# Patient Record
Sex: Female | Born: 1985
Health system: Southern US, Community
[De-identification: ages and names within clinical notes are randomized; demographics above are authoritative.]

## PROBLEM LIST (undated history)

## (undated) ENCOUNTER — Inpatient Hospital Stay (HOSPITAL_COMMUNITY): Payer: Self-pay

## (undated) ENCOUNTER — Emergency Department (HOSPITAL_COMMUNITY): Admission: EM | Payer: Medicaid Other | Source: Home / Self Care

## (undated) DIAGNOSIS — J101 Influenza due to other identified influenza virus with other respiratory manifestations: Secondary | ICD-10-CM

## (undated) DIAGNOSIS — G8929 Other chronic pain: Secondary | ICD-10-CM

## (undated) DIAGNOSIS — D649 Anemia, unspecified: Secondary | ICD-10-CM

## (undated) DIAGNOSIS — T7840XA Allergy, unspecified, initial encounter: Secondary | ICD-10-CM

## (undated) DIAGNOSIS — Z6832 Body mass index (BMI) 32.0-32.9, adult: Secondary | ICD-10-CM

## (undated) DIAGNOSIS — M1990 Unspecified osteoarthritis, unspecified site: Secondary | ICD-10-CM

## (undated) DIAGNOSIS — R87629 Unspecified abnormal cytological findings in specimens from vagina: Secondary | ICD-10-CM

## (undated) DIAGNOSIS — IMO0002 Reserved for concepts with insufficient information to code with codable children: Secondary | ICD-10-CM

## (undated) DIAGNOSIS — R6889 Other general symptoms and signs: Secondary | ICD-10-CM

## (undated) DIAGNOSIS — E559 Vitamin D deficiency, unspecified: Secondary | ICD-10-CM

## (undated) DIAGNOSIS — N6002 Solitary cyst of left breast: Secondary | ICD-10-CM

## (undated) DIAGNOSIS — E669 Obesity, unspecified: Secondary | ICD-10-CM

## (undated) DIAGNOSIS — N644 Mastodynia: Secondary | ICD-10-CM

## (undated) DIAGNOSIS — N611 Abscess of the breast and nipple: Secondary | ICD-10-CM

## (undated) DIAGNOSIS — J029 Acute pharyngitis, unspecified: Secondary | ICD-10-CM

## (undated) HISTORY — DX: Allergy, unspecified, initial encounter: T78.40XA

## (undated) HISTORY — PX: COLOSTOMY: SHX63

## (undated) HISTORY — DX: Reserved for concepts with insufficient information to code with codable children: IMO0002

## (undated) HISTORY — DX: Unspecified osteoarthritis, unspecified site: M19.90

## (undated) HISTORY — PX: OTHER SURGICAL HISTORY: SHX169

## (undated) HISTORY — PX: COLPOSCOPY: SHX161

## (undated) HISTORY — DX: Unspecified abnormal cytological findings in specimens from vagina: R87.629

## (undated) HISTORY — DX: Obesity, unspecified: E66.9

---
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
## 2008-06-16 ENCOUNTER — Inpatient Hospital Stay (HOSPITAL_COMMUNITY): Admission: AD | Admit: 2008-06-16 | Discharge: 2008-06-16 | Payer: Self-pay | Admitting: Obstetrics and Gynecology

## 2008-07-03 ENCOUNTER — Inpatient Hospital Stay (HOSPITAL_COMMUNITY): Admission: AD | Admit: 2008-07-03 | Discharge: 2008-07-03 | Payer: Self-pay | Admitting: Obstetrics & Gynecology

## 2008-07-03 ENCOUNTER — Ambulatory Visit: Payer: Self-pay | Admitting: Obstetrics and Gynecology

## 2008-07-19 ENCOUNTER — Inpatient Hospital Stay (HOSPITAL_COMMUNITY): Admission: AD | Admit: 2008-07-19 | Discharge: 2008-07-19 | Payer: Self-pay | Admitting: Obstetrics & Gynecology

## 2008-07-26 ENCOUNTER — Ambulatory Visit: Payer: Self-pay | Admitting: Obstetrics and Gynecology

## 2008-07-26 ENCOUNTER — Encounter: Payer: Self-pay | Admitting: Family Medicine

## 2008-07-26 LAB — CONVERTED CEMR LAB
Basophils Absolute: 0 10*3/uL (ref 0.0–0.1)
Hemoglobin: 11.3 g/dL — ABNORMAL LOW (ref 12.0–15.0)
Hepatitis B Surface Ag: NEGATIVE
Lymphocytes Relative: 36 % (ref 12–46)
Monocytes Absolute: 0.6 10*3/uL (ref 0.1–1.0)
Neutro Abs: 3.8 10*3/uL (ref 1.7–7.7)
RDW: 14.7 % (ref 11.5–15.5)

## 2008-08-08 ENCOUNTER — Other Ambulatory Visit: Admission: RE | Admit: 2008-08-08 | Discharge: 2008-08-08 | Payer: Self-pay | Admitting: Obstetrics & Gynecology

## 2008-08-08 ENCOUNTER — Encounter: Payer: Self-pay | Admitting: Obstetrics & Gynecology

## 2008-08-08 ENCOUNTER — Encounter: Payer: Self-pay | Admitting: Family Medicine

## 2008-08-08 ENCOUNTER — Ambulatory Visit: Payer: Self-pay | Admitting: Obstetrics & Gynecology

## 2008-08-08 LAB — CONVERTED CEMR LAB
Hgb A2 Quant: 2.4 % (ref 2.2–3.2)
Hgb F Quant: 0 % (ref 0.0–2.0)
Hgb S Quant: 0 % (ref 0.0–0.0)

## 2008-09-05 ENCOUNTER — Ambulatory Visit: Payer: Self-pay | Admitting: Obstetrics & Gynecology

## 2008-09-20 ENCOUNTER — Ambulatory Visit (HOSPITAL_COMMUNITY): Admission: RE | Admit: 2008-09-20 | Discharge: 2008-09-20 | Payer: Self-pay | Admitting: Family Medicine

## 2008-10-03 ENCOUNTER — Ambulatory Visit: Payer: Self-pay | Admitting: Obstetrics & Gynecology

## 2008-10-30 ENCOUNTER — Ambulatory Visit: Payer: Self-pay | Admitting: Obstetrics & Gynecology

## 2008-11-27 ENCOUNTER — Ambulatory Visit: Payer: Self-pay | Admitting: Obstetrics and Gynecology

## 2008-11-27 ENCOUNTER — Inpatient Hospital Stay (HOSPITAL_COMMUNITY): Admission: AD | Admit: 2008-11-27 | Discharge: 2008-11-27 | Payer: Self-pay | Admitting: Obstetrics & Gynecology

## 2008-11-27 LAB — CONVERTED CEMR LAB
RBC: 4.27 M/uL (ref 3.87–5.11)
WBC: 9.1 10*3/uL (ref 4.0–10.5)

## 2008-12-04 ENCOUNTER — Ambulatory Visit: Payer: Self-pay | Admitting: Obstetrics and Gynecology

## 2008-12-04 ENCOUNTER — Inpatient Hospital Stay (HOSPITAL_COMMUNITY): Admission: AD | Admit: 2008-12-04 | Discharge: 2008-12-04 | Payer: Self-pay | Admitting: Obstetrics & Gynecology

## 2008-12-25 ENCOUNTER — Inpatient Hospital Stay (HOSPITAL_COMMUNITY): Admission: AD | Admit: 2008-12-25 | Discharge: 2008-12-25 | Payer: Self-pay | Admitting: Obstetrics & Gynecology

## 2008-12-25 ENCOUNTER — Ambulatory Visit: Payer: Self-pay | Admitting: Obstetrics and Gynecology

## 2008-12-28 ENCOUNTER — Ambulatory Visit: Payer: Self-pay | Admitting: Obstetrics & Gynecology

## 2009-01-16 ENCOUNTER — Ambulatory Visit: Payer: Self-pay | Admitting: Obstetrics & Gynecology

## 2009-01-16 LAB — CONVERTED CEMR LAB: GC Probe Amp, Genital: NEGATIVE

## 2009-01-23 ENCOUNTER — Ambulatory Visit: Payer: Self-pay | Admitting: Family Medicine

## 2009-01-29 ENCOUNTER — Ambulatory Visit: Payer: Self-pay | Admitting: Family Medicine

## 2009-02-06 ENCOUNTER — Ambulatory Visit: Payer: Self-pay | Admitting: Family Medicine

## 2009-02-13 ENCOUNTER — Inpatient Hospital Stay (HOSPITAL_COMMUNITY): Admission: AD | Admit: 2009-02-13 | Discharge: 2009-02-13 | Payer: Self-pay | Admitting: Obstetrics & Gynecology

## 2009-02-15 ENCOUNTER — Ambulatory Visit: Payer: Self-pay | Admitting: Advanced Practice Midwife

## 2009-02-15 ENCOUNTER — Inpatient Hospital Stay (HOSPITAL_COMMUNITY): Admission: AD | Admit: 2009-02-15 | Discharge: 2009-02-18 | Payer: Self-pay | Admitting: Obstetrics & Gynecology

## 2009-02-15 ENCOUNTER — Ambulatory Visit: Payer: Self-pay | Admitting: Obstetrics & Gynecology

## 2009-03-27 ENCOUNTER — Ambulatory Visit: Payer: Self-pay | Admitting: Obstetrics & Gynecology

## 2009-03-28 ENCOUNTER — Ambulatory Visit: Payer: Self-pay | Admitting: Obstetrics and Gynecology

## 2009-05-08 ENCOUNTER — Ambulatory Visit: Payer: Self-pay | Admitting: Obstetrics and Gynecology

## 2009-08-18 ENCOUNTER — Emergency Department (HOSPITAL_COMMUNITY): Admission: EM | Admit: 2009-08-18 | Discharge: 2009-08-18 | Payer: Self-pay | Admitting: Emergency Medicine

## 2009-08-21 ENCOUNTER — Other Ambulatory Visit: Admission: RE | Admit: 2009-08-21 | Discharge: 2009-08-21 | Payer: Self-pay | Admitting: Obstetrics & Gynecology

## 2009-08-21 ENCOUNTER — Ambulatory Visit: Payer: Self-pay | Admitting: Obstetrics & Gynecology

## 2009-08-21 DIAGNOSIS — IMO0002 Reserved for concepts with insufficient information to code with codable children: Secondary | ICD-10-CM

## 2009-08-21 DIAGNOSIS — R87612 Low grade squamous intraepithelial lesion on cytologic smear of cervix (LGSIL): Secondary | ICD-10-CM

## 2009-08-21 HISTORY — DX: Reserved for concepts with insufficient information to code with codable children: IMO0002

## 2009-08-21 HISTORY — DX: Low grade squamous intraepithelial lesion on cytologic smear of cervix (LGSIL): R87.612

## 2009-09-27 ENCOUNTER — Ambulatory Visit: Payer: Self-pay | Admitting: Obstetrics & Gynecology

## 2009-09-27 ENCOUNTER — Encounter: Payer: Self-pay | Admitting: Obstetrics and Gynecology

## 2009-09-27 LAB — CONVERTED CEMR LAB: TSH: 1.614 microintl units/mL (ref 0.350–4.500)

## 2010-07-17 LAB — CBC
HCT: 31.9 % — ABNORMAL LOW (ref 36.0–46.0)
HCT: 37.9 % (ref 36.0–46.0)
Hemoglobin: 12.3 g/dL (ref 12.0–15.0)
MCHC: 32.6 g/dL (ref 30.0–36.0)
MCHC: 32.8 g/dL (ref 30.0–36.0)
MCV: 80.8 fL (ref 78.0–100.0)
RBC: 3.94 MIL/uL (ref 3.87–5.11)
RBC: 4.76 MIL/uL (ref 3.87–5.11)
RDW: 15.6 % — ABNORMAL HIGH (ref 11.5–15.5)

## 2010-07-17 LAB — URINALYSIS, ROUTINE W REFLEX MICROSCOPIC
Bilirubin Urine: NEGATIVE
Ketones, ur: NEGATIVE mg/dL
Nitrite: NEGATIVE
Urobilinogen, UA: 0.2 mg/dL (ref 0.0–1.0)

## 2010-07-17 LAB — URINE MICROSCOPIC-ADD ON

## 2010-07-19 LAB — URINALYSIS, ROUTINE W REFLEX MICROSCOPIC
Bilirubin Urine: NEGATIVE
Nitrite: NEGATIVE
Specific Gravity, Urine: 1.025 (ref 1.005–1.030)
Urobilinogen, UA: 0.2 mg/dL (ref 0.0–1.0)
pH: 6 (ref 5.0–8.0)

## 2010-07-19 LAB — WET PREP, GENITAL: Yeast Wet Prep HPF POC: NONE SEEN

## 2010-07-19 LAB — FETAL FIBRONECTIN: Fetal Fibronectin: NEGATIVE

## 2010-07-20 LAB — URINALYSIS, ROUTINE W REFLEX MICROSCOPIC
Hgb urine dipstick: NEGATIVE
Nitrite: NEGATIVE
Protein, ur: NEGATIVE mg/dL
Specific Gravity, Urine: 1.025 (ref 1.005–1.030)
Urobilinogen, UA: 0.2 mg/dL (ref 0.0–1.0)

## 2010-07-25 LAB — URINE CULTURE: Colony Count: >=100000

## 2010-07-25 LAB — URINALYSIS, ROUTINE W REFLEX MICROSCOPIC
Ketones, ur: NEGATIVE mg/dL
Leukocytes, UA: NEGATIVE
Protein, ur: NEGATIVE mg/dL
Urobilinogen, UA: 0.2 mg/dL (ref 0.0–1.0)

## 2010-07-25 LAB — ABO/RH: ABO/RH(D): O POS

## 2010-07-25 LAB — CBC
MCHC: 32 g/dL (ref 30.0–36.0)
MCV: 79.2 fL (ref 78.0–100.0)
Platelets: 333 10*3/uL (ref 150–400)
RBC: 4.61 MIL/uL (ref 3.87–5.11)
WBC: 6 10*3/uL (ref 4.0–10.5)

## 2010-07-25 LAB — POCT PREGNANCY, URINE: Preg Test, Ur: POSITIVE

## 2010-07-25 LAB — HCG, QUANTITATIVE, PREGNANCY: hCG, Beta Chain, Quant, S: 11633 m[IU]/mL — ABNORMAL HIGH (ref <5)

## 2010-07-25 LAB — URINE MICROSCOPIC-ADD ON

## 2010-07-25 LAB — GC/CHLAMYDIA PROBE AMP, GENITAL: GC Probe Amp, Genital: NEGATIVE

## 2010-07-25 LAB — WET PREP, GENITAL: Clue Cells Wet Prep HPF POC: NONE SEEN

## 2010-07-26 ENCOUNTER — Inpatient Hospital Stay (INDEPENDENT_AMBULATORY_CARE_PROVIDER_SITE_OTHER)
Admission: RE | Admit: 2010-07-26 | Discharge: 2010-07-26 | Disposition: A | Discharge status: Home / Self Care | Payer: Medicaid Other | Source: Ambulatory Visit | Attending: Family Medicine | Admitting: Family Medicine

## 2010-07-26 DIAGNOSIS — J069 Acute upper respiratory infection, unspecified: Secondary | ICD-10-CM

## 2010-07-27 ENCOUNTER — Inpatient Hospital Stay (INDEPENDENT_AMBULATORY_CARE_PROVIDER_SITE_OTHER)
Admission: RE | Admit: 2010-07-27 | Discharge: 2010-07-27 | Disposition: A | Discharge status: Home / Self Care | Payer: Medicaid Other | Source: Ambulatory Visit | Attending: Family Medicine | Admitting: Family Medicine

## 2010-07-27 DIAGNOSIS — B084 Enteroviral vesicular stomatitis with exanthem: Secondary | ICD-10-CM

## 2010-08-09 ENCOUNTER — Emergency Department (HOSPITAL_COMMUNITY)
Admission: EM | Admit: 2010-08-09 | Discharge: 2010-08-09 | Disposition: A | Discharge status: Home / Self Care | Payer: Medicaid Other | Attending: Emergency Medicine | Admitting: Emergency Medicine

## 2010-08-09 DIAGNOSIS — J3489 Other specified disorders of nose and nasal sinuses: Secondary | ICD-10-CM | POA: Insufficient documentation

## 2010-08-09 DIAGNOSIS — R070 Pain in throat: Secondary | ICD-10-CM | POA: Insufficient documentation

## 2010-08-09 DIAGNOSIS — J351 Hypertrophy of tonsils: Secondary | ICD-10-CM | POA: Insufficient documentation

## 2010-08-09 DIAGNOSIS — J039 Acute tonsillitis, unspecified: Secondary | ICD-10-CM | POA: Insufficient documentation

## 2010-08-09 LAB — RAPID STREP SCREEN (MED CTR MEBANE ONLY): Streptococcus, Group A Screen (Direct): NEGATIVE

## 2010-08-27 ENCOUNTER — Ambulatory Visit: Payer: Self-pay | Admitting: Obstetrics and Gynecology

## 2010-08-27 NOTE — Assessment & Plan Note (Signed)
NAME:  Barbara, Waller NO.:  [id number]   MEDICAL RECORD NO.:  [id number]          PATIENT TYPE:  POB   LOCATION:  [facility]         FACILITY:  [facility]   PHYSICIAN:  Allie Bossier, MD        DATE OF BIRTH:  29-Dec-1985   DATE OF SERVICE:  08/21/2009                                  CLINIC NOTE   Barbara Waller is a 25 year old single black, G1, P1, who now has a 60-month-old  daughter.  She comes in here for annual exam.  She has no particular GYN  complaints.  She said that cutting the IUD string at her last visit  resolved the intercourse issue.   PAST MEDICAL HISTORY:  Obesity.   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  She had a cyst removed from her left axilla and a pilonidal cyst  removal as well.   FAMILY HISTORY:  Negative for breast, GYN, and colon malignancies.   SOCIAL HISTORY:  Negative for tobacco, alcohol, or drug use.   ALLERGIES:  No latex allergies.  No known drug allergies.   MEDICATIONS:  She has a Mirena IUD in place, it was placed January 2011.  She is currently on amoxicillin, Mucinex, and cough drops for  bronchitis.   REVIEW OF SYSTEMS:  Her Pap smear was last done in April 2010, it was  negative.  All other review of systems questions were negative.   PHYSICAL EXAMINATION:  GENERAL:  Well-nourished, well-hydrated pleasant  black female, in no apparent distress.  VITAL SIGNS:  Height 5 feet 6.5 inches, weight 193 pounds, blood  pressure 122/84, pulse 100.  HEENT:  Normal.  BREASTS:  Normal.  HEART:  Regular rate and rhythm.  LUNGS:  Clear to auscultation bilaterally.  ABDOMEN:  Obese, benign.  She has a significant scar from her colostomy  and reversal.  EXTERNAL GENITALIA:  She has various folliculitis-type cyst in various  stages of healing.  Her cervix is parous.  The IUD string is barely  visible.  There are no cervical lesions.  Uterus on bimanual exam is  retroverted, normal size and  shape, minimally mobile.  Adnexa are  nontender.  No masses bilaterally.   ASSESSMENT AND PLAN:  Annual exam.  I have checked Pap smear and  recommend self-breast and self-vulvar exam.  I have done cultures off  her Pap smear.  I recommended weight loss.   Thank you very much.      Allie Bossier, MD     MCD/MEDQ  D:  08/21/2009  T:  08/22/2009  Job:  [id number]

## 2010-10-15 ENCOUNTER — Inpatient Hospital Stay (INDEPENDENT_AMBULATORY_CARE_PROVIDER_SITE_OTHER)
Admission: RE | Admit: 2010-10-15 | Discharge: 2010-10-15 | Disposition: A | Discharge status: Home / Self Care | Payer: Medicaid Other | Source: Ambulatory Visit | Attending: Family Medicine | Admitting: Family Medicine

## 2010-10-15 DIAGNOSIS — J310 Chronic rhinitis: Secondary | ICD-10-CM

## 2010-10-15 DIAGNOSIS — L42 Pityriasis rosea: Secondary | ICD-10-CM

## 2010-10-15 DIAGNOSIS — J019 Acute sinusitis, unspecified: Secondary | ICD-10-CM

## 2010-10-15 LAB — POCT RAPID STREP A: Streptococcus, Group A Screen (Direct): NEGATIVE

## 2011-02-19 ENCOUNTER — Ambulatory Visit: Payer: Medicaid Other | Admitting: Obstetrics & Gynecology

## 2011-02-24 ENCOUNTER — Ambulatory Visit (INDEPENDENT_AMBULATORY_CARE_PROVIDER_SITE_OTHER): Payer: Medicaid Other | Admitting: Obstetrics & Gynecology

## 2011-02-24 ENCOUNTER — Encounter: Payer: Self-pay | Admitting: Obstetrics & Gynecology

## 2011-02-24 VITALS — BP 112/73 | HR 78 | Wt 188.0 lb

## 2011-02-24 DIAGNOSIS — Z30431 Encounter for routine checking of intrauterine contraceptive device: Secondary | ICD-10-CM

## 2011-02-24 LAB — POCT URINE PREGNANCY: Preg Test, Ur: NEGATIVE

## 2011-02-24 NOTE — Progress Notes (Signed)
  Subjective:    Patient ID: Barbara Waller, female    DOB: Mar 29, 1986, 25 y.o.   MRN: [id number]  HPI Barbara Waller is here for a string check 2 years after she's had her Mirena.  She has had the strings cut twice in that time frame because they were uncomfortable to her husband during sex. She has recently been on antibiotics and complains of a 2 day history of a "burning" on her right labia minora.    I discussed that if I cut the strings any further that removal of the IUD may be complicated.  I offered to remove it today and start her on a different form of birth control.  Her husband was here for this discussion also. They both agree to have the strings cut today.  Review of Systems    She knows her pap is due and will come for an annual next month/ ASAP Objective:   Physical Exam  Normal appearing cervix and vulva and discharge. The strings were about 2mm from the os.  I cut them so that they would not be felt.      Assessment & Plan:  Strings annoying husband. The strings shouldn't be an issue now.  She will come back for an annual exam.

## 2011-02-25 LAB — WET PREP, GENITAL: Yeast Wet Prep HPF POC: NONE SEEN

## 2011-03-24 ENCOUNTER — Other Ambulatory Visit (HOSPITAL_COMMUNITY)
Admission: RE | Admit: 2011-03-24 | Discharge: 2011-03-24 | Disposition: A | Discharge status: Home / Self Care | Payer: Medicaid Other | Source: Ambulatory Visit | Attending: Obstetrics & Gynecology | Admitting: Obstetrics & Gynecology

## 2011-03-24 ENCOUNTER — Ambulatory Visit (INDEPENDENT_AMBULATORY_CARE_PROVIDER_SITE_OTHER): Payer: Medicaid Other | Admitting: Obstetrics & Gynecology

## 2011-03-24 ENCOUNTER — Encounter: Payer: Self-pay | Admitting: Obstetrics & Gynecology

## 2011-03-24 VITALS — BP 111/77 | HR 66 | Ht 66.0 in | Wt 190.0 lb

## 2011-03-24 DIAGNOSIS — Z0000 Encounter for general adult medical examination without abnormal findings: Secondary | ICD-10-CM

## 2011-03-24 DIAGNOSIS — Z01419 Encounter for gynecological examination (general) (routine) without abnormal findings: Secondary | ICD-10-CM | POA: Insufficient documentation

## 2011-03-24 DIAGNOSIS — Z23 Encounter for immunization: Secondary | ICD-10-CM

## 2011-03-24 DIAGNOSIS — Z113 Encounter for screening for infections with a predominantly sexual mode of transmission: Secondary | ICD-10-CM | POA: Insufficient documentation

## 2011-03-24 NOTE — Progress Notes (Signed)
Subjective:    Barbara Waller is a 25 y.o. female who presents for an annual exam. The patient has no complaints today. Her husband is no longer able to feel the IUD strings now that they have been shortened. The patient is sexually active. GYN screening history: last pap: was normal. The patient wears seatbelts: yes. The patient participates in regular exercise: no. Has the patient ever been transfused or tattooed?: no. The patient reports that there is not domestic violence in her life.   Menstrual History: OB History    Grav Para Term Preterm Abortions TAB SAB Ect Mult Living   1         1      Menarche age: 49 Patient's last menstrual period was 03/03/2011.    The following portions of the patient's history were reviewed and updated as appropriate: allergies, current medications, past family history, past medical history, past social history, past surgical history and problem list.  Review of Systems A comprehensive review of systems was negative.    Objective:    BP 111/77  Pulse 66  Ht 5\' 6"  (1.676 m)  Wt 190 lb (86.183 kg)  BMI 30.67 kg/m2  LMP 03/03/2011  General Appearance:    Alert, cooperative, no distress, appears stated age  Head:    Normocephalic, without obvious abnormality, atraumatic  Eyes:    PERRL, conjunctiva/corneas clear, EOM's intact, fundi    benign, both eyes  Ears:    Normal TM's and external ear canals, both ears  Nose:   Nares normal, septum midline, mucosa normal, no drainage    or sinus tenderness  Throat:   Lips, mucosa, and tongue normal; teeth and gums normal  Neck:   Supple, symmetrical, trachea midline, no adenopathy;    thyroid:  no enlargement/tenderness/nodules; no carotid   bruit or JVD  Back:     Symmetric, no curvature, ROM normal, no CVA tenderness  Lungs:     Clear to auscultation bilaterally, respirations unlabored  Chest Wall:    No tenderness or deformity   Heart:    Regular rate and rhythm, S1 and S2 normal, no murmur, rub   or  gallop  Breast Exam:    No tenderness, masses, or nipple abnormality  Abdomen:     Soft, non-tender, bowel sounds active all four quadrants,    no masses, no organomegaly  Genitalia:    Normal female without lesion, discharge or tenderness  Rectal:    Normal tone, normal prostate, no masses or tenderness;   guaiac negative stool  Extremities:   Extremities normal, atraumatic, no cyanosis or edema  Pulses:   2+ and symmetric all extremities  Skin:   Skin color, texture, turgor normal, no rashes or lesions  Lymph nodes:   Cervical, supraclavicular, and axillary nodes normal  Neurologic:   CNII-XII intact, normal strength, sensation and reflexes    throughout  .    Assessment:    Healthy female exam.    Plan:     Pap smear.

## 2011-06-12 ENCOUNTER — Encounter (HOSPITAL_COMMUNITY): Payer: Self-pay | Admitting: Emergency Medicine

## 2011-06-12 ENCOUNTER — Emergency Department (HOSPITAL_COMMUNITY)
Admission: EM | Admit: 2011-06-12 | Discharge: 2011-06-12 | Disposition: A | Discharge status: Home / Self Care | Payer: Medicaid Other | Source: Home / Self Care | Attending: Family Medicine | Admitting: Family Medicine

## 2011-06-12 DIAGNOSIS — J069 Acute upper respiratory infection, unspecified: Secondary | ICD-10-CM

## 2011-06-12 MED ORDER — GUAIFENESIN-CODEINE 100-10 MG/5ML PO SYRP: 5.0000 mL | ORAL_SOLUTION | Freq: Four times a day (QID) | ORAL | Status: AC | PRN

## 2011-06-12 MED ORDER — AZITHROMYCIN 250 MG PO TABS: 250.0000 mg | ORAL_TABLET | Freq: Every day | ORAL | Status: AC

## 2011-06-12 NOTE — Discharge Instructions (Signed)
Tylenol or motrin as needed. Avoid caffeine and milk products.  

## 2011-06-12 NOTE — ED Notes (Signed)
Cough and congestion.  Onset 3 weeks ago.

## 2011-06-12 NOTE — ED Provider Notes (Signed)
History     CSN: [id number]  Arrival date & time 06/12/11  1646   First MD Initiated Contact with Patient 06/12/11 1648      Chief Complaint  Patient presents with  . Cough    (Consider location/radiation/quality/duration/timing/severity/associated sxs/prior treatment) HPI Comments: Cough and congestion x 2 wks. No fever. Runny nose has resolved. Cough has become an issue. Cough is occ productive and she feels as if mucus is in her upper chest/throat area. She has taken otc mucus products. No wheezing or shortness of breath  The history is provided by the patient.    Past Medical History  Diagnosis Date  . 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 Obesity     Past Surgical History  Procedure Date  . Colposcopy   . Colostomy   . Axilla abscess     Family History  Problem Relation Age of Onset  . Diabetes Mother   . Hypertension Mother   . Diabetes Maternal Grandmother     History  Substance Use Topics  . Smoking status: Never Smoker   . Smokeless tobacco: Not on file  . Alcohol Use: No    OB History    Grav Para Term Preterm Abortions TAB SAB Ect Mult Living   1         1      Review of Systems  Constitutional: Negative.   HENT: Negative.   Cardiovascular: Negative.   Gastrointestinal: Negative.   Genitourinary: Negative.   Musculoskeletal: Negative.     Allergies  Review of patient's allergies indicates no known allergies.  Home Medications   Current Outpatient Rx  Name Route Sig Dispense Refill  . OVER THE COUNTER MEDICATION  Daytime cold medicine    . AZITHROMYCIN 250 MG PO TABS Oral Take 1 tablet (250 mg total) by mouth daily. Take first 2 tablets together, then 1 every day until finished. 6 tablet 0  . CLINDAMYCIN HCL 150 MG PO CAPS Oral Take 150 mg by mouth 3 (three) times daily.      . GUAIFENESIN-CODEINE 100-10 MG/5ML PO SYRP Oral Take 5 mLs by mouth every 6 (six) hours as needed for  cough. 120 mL 0  . HYDROCODONE-ACETAMINOPHEN 10-300 MG PO TABS Oral Take 1 tablet by mouth every 6 (six) hours as needed.      . IBUPROFEN 800 MG PO TABS Oral Take 800 mg by mouth every 8 (eight) hours as needed.        BP 106/72  Pulse 82  Temp(Src) 98.2 F (36.8 C) (Oral)  Resp 16  SpO2 100%  LMP 05/25/2011  Physical Exam  Nursing note and vitals reviewed. Constitutional: She appears well-developed and well-nourished. No distress.  HENT:  Head: Normocephalic and atraumatic.  Nose: Nose normal.  Mouth/Throat: Oropharynx is clear and moist.       Tonsils swollen and red  Neck: Normal range of motion. Neck supple.  Cardiovascular: Normal rate and regular rhythm.   Pulmonary/Chest: Effort normal and breath sounds normal. She has no wheezes.  Lymphadenopathy:    She has cervical adenopathy.  Skin: Skin is warm and dry.    ED Course  Procedures (including critical care time)  Labs Reviewed - No data to display No results found.   1. URI (upper respiratory infection)       MDM          Randa Spike, MD 06/12/11 [phone]

## 2011-08-12 ENCOUNTER — Encounter (HOSPITAL_COMMUNITY): Payer: Self-pay | Admitting: *Deleted

## 2011-08-12 ENCOUNTER — Inpatient Hospital Stay (HOSPITAL_COMMUNITY)
Admission: AD | Admit: 2011-08-12 | Discharge: 2011-08-12 | Disposition: A | Discharge status: Home / Self Care | Payer: Medicaid Other | Source: Ambulatory Visit | Attending: Obstetrics & Gynecology | Admitting: Obstetrics & Gynecology

## 2011-08-12 DIAGNOSIS — N926 Irregular menstruation, unspecified: Secondary | ICD-10-CM

## 2011-08-12 DIAGNOSIS — N912 Amenorrhea, unspecified: Secondary | ICD-10-CM | POA: Insufficient documentation

## 2011-08-12 DIAGNOSIS — Z3200 Encounter for pregnancy test, result unknown: Secondary | ICD-10-CM | POA: Insufficient documentation

## 2011-08-12 DIAGNOSIS — Z30431 Encounter for routine checking of intrauterine contraceptive device: Secondary | ICD-10-CM

## 2011-08-12 NOTE — MAU Note (Signed)
Patient states she has had a Mirena IUD since 2010 and has periods monthly. Missed her April period and is concerned she may be pregnant. Denies any problems, no bleeding or pain.

## 2011-08-12 NOTE — MAU Provider Note (Signed)
  History     CSN: [id number]  Arrival date and time: 08/12/11 1148   First Provider Initiated Contact with Patient 08/12/11 1252      Chief Complaint  Patient presents with  . Possible Pregnancy   HPI: HPI: Barbara Waller is a 26 y.o. year old [id number] female w/ a Mirena IUD placed 2 years ago who presents to MAU requesting pregnancy test. She reports scant pink discharge last month when she should have had a period..  OB History    Grav Para Term Preterm Abortions TAB SAB Ect Mult Living   1 1 1       1       Past Medical History  Diagnosis Date  . 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 Obesity     Past Surgical History  Procedure Date  . Colposcopy   . Colostomy   . Axilla abscess     Family History  Problem Relation Age of Onset  . Diabetes Mother   . Hypertension Mother   . Diabetes Maternal Grandmother     History  Substance Use Topics  . Smoking status: Never Smoker   . Smokeless tobacco: Not on file  . Alcohol Use: No    Allergies: No Known Allergies  Prescriptions prior to admission  Medication Sig Dispense Refill  . clindamycin (CLEOCIN) 150 MG capsule Take 150 mg by mouth 3 (three) times daily.        . Hydrocodone-Acetaminophen 10-300 MG TABS Take 1 tablet by mouth every 6 (six) hours as needed.        Marland Kitchen ibuprofen (ADVIL,MOTRIN) 800 MG tablet Take 800 mg by mouth every 8 (eight) hours as needed.        Marland Kitchen OVER THE COUNTER MEDICATION Daytime cold medicine        ROS: mild breast tenderness. Otherwise neg Physical Exam   Blood pressure 118/74, pulse 89, temperature 99.3 F (37.4 C), temperature source Oral, resp. rate 16, height 5\' 7"  (1.702 m), last menstrual period 07/02/2011, SpO2 100.00%.  Physical Exam: Deferred.  MAU Course  Procedures  Recent Results (from the past 168 hour(s))  POCT PREGNANCY, URINE   Collection Time   08/12/11 12:44 PM      Component Value Range   Preg Test,  Ur NEGATIVE  NEGATIVE     Assessment and Plan   1. IUD check up   2. Abnormal menses     Plan: D/C home Discussed light/absent periods common w/ Mirena, but important to rule out pregnancy due to increased risk of ectopic. Follow-up Information    Follow up with [facility]. (As needed if symptoms worsen)         Macoy Rodwell 08/12/2011, 12:52 PM

## 2011-08-12 NOTE — MAU Note (Signed)
Pt in for pregnancy test due to no period, weight loss and cravings.  Has IUD in place.

## 2011-08-21 ENCOUNTER — Encounter (HOSPITAL_COMMUNITY): Payer: Self-pay | Admitting: Emergency Medicine

## 2011-08-21 ENCOUNTER — Emergency Department (HOSPITAL_COMMUNITY)
Admission: EM | Admit: 2011-08-21 | Discharge: 2011-08-21 | Disposition: A | Discharge status: Home Health | Payer: Medicaid Other | Source: Home / Self Care | Attending: Family Medicine | Admitting: Family Medicine

## 2011-08-21 DIAGNOSIS — B86 Scabies: Secondary | ICD-10-CM

## 2011-08-21 MED ORDER — PERMETHRIN 5 % EX CREA: TOPICAL_CREAM | CUTANEOUS | Status: AC

## 2011-08-21 NOTE — ED Notes (Signed)
Reports rash that started between fingers, now right ac, chest, "showing up in creases" rash itches badly

## 2011-08-21 NOTE — Discharge Instructions (Signed)
Scabies Scabies are small bugs (mites) that burrow under the skin and cause red bumps and severe itching. These bugs can only be seen with a microscope. Scabies are highly contagious. They can spread easily from person to person by direct contact. They are also spread through sharing clothing or linens that have the scabies mites living in them. It is not unusual for an entire family to become infected through shared towels, clothing, or bedding.  HOME CARE INSTRUCTIONS   Your caregiver may prescribe a cream or lotion to kill the mites. If this cream is prescribed; massage the cream into the entire area of the body from the neck to the bottom of both feet. Also massage the cream into the scalp and face if your child is less than 1 year old. Avoid the eyes and mouth.   Leave the cream on for 8 to12 hours. Do not wash your hands after application. Your child should bathe or shower after the 8 to 12 hour application period. Sometimes it is helpful to apply the cream to your child at right before bedtime.   One treatment is usually effective and will eliminate approximately 95% of infestations. For severe cases, your caregiver may decide to repeat the treatment in 1 week. Everyone in your household should be treated with one application of the cream.   New rashes or burrows should not appear after successful treatment within 24 to 48 hours; however the itching and rash may last for 2 to 4 weeks after successful treatment. If your symptoms persist longer than this, see your caregiver.   Your caregiver also may prescribe a medication to help with the itching or to help the rash go away more quickly.   Scabies can live on clothing or linens for up to 3 days. Your entire child's recently used clothing, towels, stuffed toys, and bed linens should be washed in hot water and then dried in a dryer for at least 20 minutes on high heat. Items that cannot be washed should be enclosed in a plastic bag for at least 3  days.   To help relieve itching, bathe your child in a cool bath or apply cool washcloths to the affected areas.   Your child may return to school after treatment with the prescribed cream.  SEEK MEDICAL CARE IF:   The itching persists longer than 4 weeks after treatment.   The rash spreads or becomes infected (the area has red blisters or yellow-tan crust).  Document Released: 03/31/2005 Document Revised: 03/20/2011 Document Reviewed: 08/09/2008 [employer] Patient Information 2012 [employer]. 

## 2011-08-21 NOTE — ED Provider Notes (Signed)
History     CSN: [id number]  Arrival date & time 08/21/11  1643   First MD Initiated Contact with Patient 08/21/11 1807      Chief Complaint  Patient presents with  . Rash    (Consider location/radiation/quality/duration/timing/severity/associated sxs/prior treatment) Patient is a 26 y.o. female presenting with rash. The history is provided by the patient. No language interpreter was used.  Rash  This is a new problem. The current episode started more than 2 days ago. The problem is associated with nothing. The rash is present on the left hand and right hand. The pain is at a severity of 2/10. The pain is mild. She has tried nothing for the symptoms. The treatment provided moderate relief.   Pt complains of a rash to fingers Past Medical History  Diagnosis Date  . 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 Obesity     Past Surgical History  Procedure Date  . Colposcopy   . Colostomy   . Axilla abscess     Family History  Problem Relation Age of Onset  . Diabetes Mother   . Hypertension Mother   . Diabetes Maternal Grandmother   . Anesthesia problems Neg Hx     History  Substance Use Topics  . Smoking status: Never Smoker   . Smokeless tobacco: Not on file  . Alcohol Use: No    OB History    Grav Para Term Preterm Abortions TAB SAB Ect Mult Living   1 1 1       1       Review of Systems  Skin: Positive for rash.  All other systems reviewed and are negative.    Allergies  Review of patient's allergies indicates no known allergies.  Home Medications   Current Outpatient Rx  Name Route Sig Dispense Refill  . HYDROCORTISONE 0.5 % EX CREA Topical Apply topically 2 (two) times daily.    Marland Kitchen CLINDAMYCIN HCL 150 MG PO CAPS Oral Take 150 mg by mouth 3 (three) times daily.      Marland Kitchen HYDROCODONE-ACETAMINOPHEN 10-300 MG PO TABS Oral Take 1 tablet by mouth every 6 (six) hours as needed.      . IBUPROFEN 800 MG PO TABS  Oral Take 800 mg by mouth every 8 (eight) hours as needed.      Marland Kitchen OVER THE COUNTER MEDICATION  Daytime cold medicine      BP 110/81  Pulse 80  Temp(Src) 98.7 F (37.1 C) (Oral)  Resp 16  SpO2 97%  LMP 08/18/2011  Physical Exam  Nursing note and vitals reviewed. Constitutional: She is oriented to person, place, and time. She appears well-developed and well-nourished.  HENT:  Head: Normocephalic and atraumatic.  Musculoskeletal: She exhibits tenderness.  Neurological: She is alert and oriented to person, place, and time. She has normal reflexes.  Skin: Rash noted.       Multiple burrows  Psychiatric: She has a normal mood and affect.    ED Course  Procedures (including critical care time)  Labs Reviewed - No data to display No results found.   No diagnosis found.    MDM   Elemite cream       Lonia Skinner [place], [place] 08/21/11 1828  Lonia Skinner [place], [place] 08/21/11 [phone]

## 2011-08-22 ENCOUNTER — Telehealth (HOSPITAL_COMMUNITY): Payer: Self-pay | Admitting: *Deleted

## 2011-08-22 NOTE — ED Notes (Signed)
Pt. called @ 1705 and said she has applied the cream for itching and washed it off but is still itching. She tried Hydrocortisone cream but only temp. relief.  Discussed with Langston Masker PA and she said Benadryl by mouth is the best thing. I called pt. back and told her this. I told her it can make her sleepy. She can take 1 Q 4-6hrs during the day and 2 at bedtime. Barbara Waller 08/22/2011

## 2011-08-22 NOTE — ED Provider Notes (Signed)
Medical screening examination/treatment/procedure(s) were performed by resident physician or non-physician practitioner and as supervising physician I was immediately available for consultation/collaboration.   Barkley Bruns MD.    Linna Hoff, MD 08/22/11 [phone]

## 2011-11-18 ENCOUNTER — Ambulatory Visit: Payer: Medicaid Other | Admitting: Obstetrics and Gynecology

## 2011-11-18 DIAGNOSIS — Z30431 Encounter for routine checking of intrauterine contraceptive device: Secondary | ICD-10-CM

## 2011-12-02 ENCOUNTER — Ambulatory Visit: Payer: Medicaid Other | Attending: Orthopedic Surgery | Admitting: Rehabilitative and Restorative Service Providers"

## 2011-12-02 DIAGNOSIS — M2569 Stiffness of other specified joint, not elsewhere classified: Secondary | ICD-10-CM | POA: Insufficient documentation

## 2011-12-02 DIAGNOSIS — M545 Low back pain, unspecified: Secondary | ICD-10-CM | POA: Insufficient documentation

## 2011-12-02 DIAGNOSIS — IMO0001 Reserved for inherently not codable concepts without codable children: Secondary | ICD-10-CM | POA: Insufficient documentation

## 2011-12-18 ENCOUNTER — Encounter: Payer: Medicaid Other | Admitting: Rehabilitative and Restorative Service Providers"

## 2011-12-23 ENCOUNTER — Encounter: Payer: Medicaid Other | Admitting: Rehabilitative and Restorative Service Providers"

## 2011-12-30 ENCOUNTER — Encounter: Payer: Medicaid Other | Admitting: Rehabilitative and Restorative Service Providers"

## 2012-01-09 ENCOUNTER — Other Ambulatory Visit: Payer: Self-pay | Admitting: Family Medicine

## 2012-01-09 DIAGNOSIS — R102 Pelvic and perineal pain: Secondary | ICD-10-CM

## 2012-01-09 DIAGNOSIS — Z30431 Encounter for routine checking of intrauterine contraceptive device: Secondary | ICD-10-CM

## 2012-01-12 ENCOUNTER — Other Ambulatory Visit (HOSPITAL_COMMUNITY): Payer: Medicaid Other

## 2012-01-12 ENCOUNTER — Other Ambulatory Visit: Payer: Medicaid Other

## 2013-02-07 ENCOUNTER — Emergency Department (INDEPENDENT_AMBULATORY_CARE_PROVIDER_SITE_OTHER)
Admission: EM | Admit: 2013-02-07 | Discharge: 2013-02-07 | Disposition: A | Discharge status: Home / Self Care | Payer: Self-pay | Source: Home / Self Care | Attending: Emergency Medicine | Admitting: Emergency Medicine

## 2013-02-07 ENCOUNTER — Emergency Department (INDEPENDENT_AMBULATORY_CARE_PROVIDER_SITE_OTHER): Payer: Self-pay

## 2013-02-07 ENCOUNTER — Encounter (HOSPITAL_COMMUNITY): Payer: Self-pay | Admitting: Emergency Medicine

## 2013-02-07 DIAGNOSIS — R141 Gas pain: Secondary | ICD-10-CM

## 2013-02-07 DIAGNOSIS — R197 Diarrhea, unspecified: Secondary | ICD-10-CM

## 2013-02-07 LAB — POCT URINALYSIS DIP (DEVICE)
Bilirubin Urine: NEGATIVE
Leukocytes, UA: NEGATIVE
pH: 6 (ref 5.0–8.0)

## 2013-02-07 LAB — POCT PREGNANCY, URINE: Preg Test, Ur: NEGATIVE

## 2013-02-07 MED ORDER — LOPERAMIDE HCL 2 MG PO CAPS: 2.0000 mg | ORAL_CAPSULE | Freq: Four times a day (QID) | ORAL | Status: DC | PRN

## 2013-02-07 MED ORDER — ALUM & MAG HYDROXIDE-SIMETH 400-400-40 MG/5ML PO SUSP: 10.0000 mL | Freq: Four times a day (QID) | ORAL | Status: DC | PRN

## 2013-02-07 NOTE — ED Provider Notes (Signed)
CSN: [id number]     Arrival date & time 02/07/13  6213 History   First MD Initiated Contact with Patient 02/07/13 [phone]     Chief Complaint  Patient presents with  . Diarrhea   (Consider location/radiation/quality/duration/timing/severity/associated sxs/prior Treatment) HPI Comments: 27 year old female presents complaining of one week of bloating, diarrhea, and cramping abdominal pain. This began the day after eating amount of Hershey pie.  She says that it tasted fine and she didn't think there is anything wrong with it. However, since then she has had diarrhea that has been refractory to over-the-counter medications, as well as a large gas and bloating along with gas pains. This is not begun to resolve at all. She denies fever, chills, bloody diarrhea, dark stools. She has not had any normal stools since this began. She can normally have dairy products without any abdominal discomfort. No previous history of similar symptoms. Her pain is not affected by eating or drinking.  Patient is a 27 y.o. female presenting with diarrhea.  Diarrhea Associated symptoms: abdominal pain   Associated symptoms: no arthralgias, no chills, no fever, no myalgias and no vomiting     Past Medical History  Diagnosis Date  . 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 Obesity    Past Surgical History  Procedure Laterality Date  . Colposcopy    . Colostomy    . Axilla abscess     Family History  Problem Relation Age of Onset  . Diabetes Mother   . Hypertension Mother   . Diabetes Maternal Grandmother   . Anesthesia problems Neg Hx    History  Substance Use Topics  . Smoking status: Never Smoker   . Smokeless tobacco: Not on file  . Alcohol Use: No   OB History   Grav Para Term Preterm Abortions TAB SAB Ect Mult Living   1 1 1       1      Review of Systems  Constitutional: Negative for fever and chills.  Eyes: Negative for visual disturbance.   Respiratory: Negative for cough and shortness of breath.   Cardiovascular: Negative for chest pain, palpitations and leg swelling.  Gastrointestinal: Positive for abdominal pain and diarrhea. Negative for nausea and vomiting.  Endocrine: Negative for polydipsia and polyuria.  Genitourinary: Negative for dysuria, urgency and frequency.  Musculoskeletal: Negative for arthralgias and myalgias.  Skin: Negative for rash.  Neurological: Negative for dizziness, weakness and light-headedness.    Allergies  Review of patient's allergies indicates no known allergies.  Home Medications   Current Outpatient Rx  Name  Route  Sig  Dispense  Refill  . alum & mag hydroxide-simeth (MAALOX PLUS) 400-400-40 MG/5ML suspension   Oral   Take 10 mLs by mouth every 6 (six) hours as needed for indigestion.   355 mL   0   . clindamycin (CLEOCIN) 150 MG capsule   Oral   Take 150 mg by mouth 3 (three) times daily.           . Hydrocodone-Acetaminophen 10-300 MG TABS   Oral   Take 1 tablet by mouth every 6 (six) hours as needed.           . hydrocortisone cream 0.5 %   Topical   Apply topically 2 (two) times daily.         Marland Kitchen ibuprofen (ADVIL,MOTRIN) 800 MG tablet   Oral   Take 800 mg by mouth every 8 (eight) hours  as needed.           . loperamide (IMODIUM) 2 MG capsule   Oral   Take 1 capsule (2 mg total) by mouth 4 (four) times daily as needed for diarrhea or loose stools.   12 capsule   0   . OVER THE COUNTER MEDICATION      Daytime cold medicine          BP 112/74  Pulse 74  Temp(Src) 98.5 F (36.9 C) (Oral)  Resp 20  SpO2 100%  LMP 02/01/2013 Physical Exam  Nursing note and vitals reviewed. Constitutional: She is oriented to person, place, and time. Vital signs are normal. She appears well-developed and well-nourished. No distress.  HENT:  Head: Normocephalic and atraumatic.  Cardiovascular: Normal rate, regular rhythm and normal heart sounds.   Pulmonary/Chest:  Effort normal and breath sounds normal. No respiratory distress. She has no wheezes. She has no rales.  Abdominal: Soft. Bowel sounds are normal. She exhibits no distension and no mass. There is no tenderness. There is no rebound and no guarding.  Neurological: She is alert and oriented to person, place, and time. She has normal strength. Coordination normal.  Skin: Skin is warm and dry. No rash noted. She is not diaphoretic.  Psychiatric: She has a normal mood and affect. Judgment normal.    ED Course  Procedures (including critical care time) Labs Review Labs Reviewed  POCT URINALYSIS DIP (DEVICE) - Abnormal; Notable for the following:    Ketones, ur TRACE (*)    Hgb urine dipstick TRACE (*)    All other components within normal limits  POCT PREGNANCY, URINE   Imaging Review Dg Abd 1 View  02/07/2013   CLINICAL DATA:  Abdominal pain for 1 week  EXAM: ABDOMEN - 1 VIEW  COMPARISON:  None.  FINDINGS: The bowel gas pattern is normal. No radio-opaque calculi or other significant radiographic abnormality are seen.  IMPRESSION: Negative.   Electronically Signed   By: Alcide Clever M.D.   On: 02/07/2013 09:42      MDM   1. Diarrhea   2. Abdominal gas pain    No red flags. Patient is nontoxic and afebrile. The abdomen is completely nontender. There is a large amount of gas in the descending and transverse colon. We will treat her symptoms better and see if this resolves. She'll followup if any worsening.   Meds ordered this encounter  Medications  . loperamide (IMODIUM) 2 MG capsule    Sig: Take 1 capsule (2 mg total) by mouth 4 (four) times daily as needed for diarrhea or loose stools.    Dispense:  12 capsule    Refill:  0    Order Specific Question:  Supervising Provider    Answer:  Lorenz Coaster, DAVID C [id number]  . alum & mag hydroxide-simeth (MAALOX PLUS) 400-400-40 MG/5ML suspension    Sig: Take 10 mLs by mouth every 6 (six) hours as needed for indigestion.    Dispense:  355 mL     Refill:  0    Order Specific Question:  Supervising Provider    Answer:  Lorenz Coaster, DAVID C [[id number]]       Graylon Good, PA-C 02/07/13 [phone]

## 2013-02-07 NOTE — ED Provider Notes (Signed)
Medical screening examination/treatment/procedure(s) were performed by non-physician practitioner and as supervising physician I was immediately available for consultation/collaboration.  Anahlia Iseminger, M.D.  Kamill Fulbright C Traeson Dusza, MD 02/07/13 1449 

## 2013-02-07 NOTE — ED Notes (Signed)
Pt  Reports  Symptoms  Of  Bloating        With  Feeling of  abd  Pressure  As  Well  As  Diarrhea     Pt  denys  Any  Vomiting     But      Has  Had  Some nausea            -   She   Reports  She  Has  Tried  pepto      And  rolaids  But  Continues  To  Have  Symptoms

## 2013-02-28 ENCOUNTER — Encounter (HOSPITAL_COMMUNITY): Payer: Self-pay | Admitting: Emergency Medicine

## 2013-02-28 ENCOUNTER — Emergency Department (HOSPITAL_COMMUNITY)
Admission: EM | Admit: 2013-02-28 | Discharge: 2013-02-28 | Disposition: A | Discharge status: Home / Self Care | Payer: Self-pay | Source: Home / Self Care | Attending: Family Medicine | Admitting: Family Medicine

## 2013-02-28 DIAGNOSIS — J029 Acute pharyngitis, unspecified: Secondary | ICD-10-CM

## 2013-02-28 LAB — POCT RAPID STREP A: Streptococcus, Group A Screen (Direct): NEGATIVE

## 2013-02-28 MED ORDER — HYDROCODONE-ACETAMINOPHEN 5-325 MG PO TABS: 0.5000 | ORAL_TABLET | Freq: Every evening | ORAL | Status: DC | PRN

## 2013-02-28 MED ORDER — PREDNISONE 5 MG PO KIT: PACK | ORAL | Status: DC

## 2013-02-28 NOTE — ED Notes (Signed)
C/o cough, sore throat, ear pain, and headache since  Saturday States has used different remedies but no reflief.

## 2013-02-28 NOTE — ED Provider Notes (Signed)
Barbara Waller is a 27 y.o. female who presents to Urgent Care today for cough headache sore throat pain with swallowing present for 2 days. Patient denies any significant production to her cough. No nausea vomiting or diarrhea. No trouble breathing chest pain or palpitation. She feels well otherwise.    Past Medical History  Diagnosis Date  . 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 Obesity    History  Substance Use Topics  . Smoking status: Never Smoker   . Smokeless tobacco: Not on file  . Alcohol Use: No   ROS as above Medications reviewed. No current facility-administered medications for this encounter.   Current Outpatient Prescriptions  Medication Sig Dispense Refill  . alum & mag hydroxide-simeth (MAALOX PLUS) 400-400-40 MG/5ML suspension Take 10 mLs by mouth every 6 (six) hours as needed for indigestion.  355 mL  0  . HYDROcodone-acetaminophen (NORCO/VICODIN) 5-325 MG per tablet Take 0.5 tablets by mouth at bedtime as needed (cough).  6 tablet  0  . hydrocortisone cream 0.5 % Apply topically 2 (two) times daily.      Marland Kitchen ibuprofen (ADVIL,MOTRIN) 800 MG tablet Take 800 mg by mouth every 8 (eight) hours as needed.        . loperamide (IMODIUM) 2 MG capsule Take 1 capsule (2 mg total) by mouth 4 (four) times daily as needed for diarrhea or loose stools.  12 capsule  0  . OVER THE COUNTER MEDICATION Daytime cold medicine      . PredniSONE 5 MG KIT 12 day kit po  1 kit  0    Exam:  BP 127/81  Pulse 92  Temp(Src) 99.4 F (37.4 C) (Oral)  Resp 16  SpO2 100%  LMP 02/22/2013 Gen: Well NAD HEENT: EOMI,  MMM, posterior pharynx is erythematous with tonsillar hypertrophy. Tympanic membranes are occluded by cerumen bilaterally Lungs: CTABL Nl WOB Heart: RRR no MRG Abd: NABS, NT, ND Exts: Non edematous BL  LE, warm and well perfused.   Results for orders placed during the hospital encounter of 02/28/13 (from the past 24 hour(s))   POCT RAPID STREP A ([facility] ONLY)     Status: None   Collection Time    02/28/13  1:43 PM      Result Value Range   Streptococcus, Group A Screen (Direct) NEGATIVE  NEGATIVE   No results found.  Assessment and Plan: 27 y.o. female with  1) pharyngitis with URI. Plan to treat with prednisone and hydrocodone containing cough medication. Additionally will irrigated patient's ears.  Throat culture is pending.  Discussed warning signs or symptoms. Please see discharge instructions. Patient expresses understanding.      Rodolph Bong, MD 02/28/13 [phone]

## 2013-07-07 ENCOUNTER — Emergency Department (INDEPENDENT_AMBULATORY_CARE_PROVIDER_SITE_OTHER)
Admission: EM | Admit: 2013-07-07 | Discharge: 2013-07-07 | Disposition: A | Discharge status: Home / Self Care | Payer: PRIVATE HEALTH INSURANCE | Source: Home / Self Care | Attending: Family Medicine | Admitting: Family Medicine

## 2013-07-07 ENCOUNTER — Encounter (HOSPITAL_COMMUNITY): Payer: Self-pay | Admitting: Emergency Medicine

## 2013-07-07 DIAGNOSIS — M65839 Other synovitis and tenosynovitis, unspecified forearm: Secondary | ICD-10-CM

## 2013-07-07 DIAGNOSIS — M778 Other enthesopathies, not elsewhere classified: Secondary | ICD-10-CM

## 2013-07-07 MED ORDER — PREDNISONE 10 MG PO KIT: PACK | ORAL | Status: DC

## 2013-07-07 NOTE — Discharge Instructions (Signed)
Thank you for coming in today. Use the wrist brace for least one week Take prednisone as directed Followup with Dr. Q[facility] if not getting better

## 2013-07-07 NOTE — ED Provider Notes (Signed)
Barbara Waller is a 28 y.o. female who presents to Urgent Care today for left wrist pain for 2 days. Patient denies any injury. She notes vague diffuse moderate to severe wrist pain across the radial aspect of her rest. This is worse with hand motion. 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 She has tried some ibuprofen and a over-the-counter soft wrist support which have helped only a little. No radiating pain weakness or numbness. She denies any significant tingling   Past Medical History  Diagnosis Date  . 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 Obesity    History  Substance Use Topics  . Smoking status: Never Smoker   . Smokeless tobacco: Not on file  . Alcohol Use: No   ROS as above Medications: No current facility-administered medications for this encounter.   Current Outpatient Prescriptions  Medication Sig Dispense Refill  . alum & mag hydroxide-simeth (MAALOX PLUS) 400-400-40 MG/5ML suspension Take 10 mLs by mouth every 6 (six) hours as needed for indigestion.  355 mL  0  . hydrocortisone cream 0.5 % Apply topically 2 (two) times daily.      Marland Kitchen ibuprofen (ADVIL,MOTRIN) 800 MG tablet Take 800 mg by mouth every 8 (eight) hours as needed.        . loperamide (IMODIUM) 2 MG capsule Take 1 capsule (2 mg total) by mouth 4 (four) times daily as needed for diarrhea or loose stools.  12 capsule  0  . OVER THE COUNTER MEDICATION Daytime cold medicine      . PredniSONE 10 MG KIT 12 day dose pack po  1 kit  0    Exam:  BP 119/77  Pulse 77  Temp(Src) 98.3 F (36.8 C) (Oral)  Resp 18  SpO2 100%  LMP 06/25/2013 Gen: Well NAD Left wrist is normal-appearing with no swelling. Tender to palpation along the distal radius about 3 cm proximal to the radial styloid. Nontender in the anatomical snuff box Negative Finkelstein's Tinel's and Phalen's test. Capillary refill and sensation are intact  distally Wrist motion is intact.   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 Plan to treat with thumb spica splint, and prednisone dosepak. Handout provided. Followup with sports medicine if not improving.  Discussed warning signs or symptoms. Please see discharge instructions. Patient expresses understanding.    Gregor Hams, MD 07/07/13 [phone]

## 2013-07-07 NOTE — ED Notes (Signed)
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

## 2013-07-16 ENCOUNTER — Encounter (HOSPITAL_COMMUNITY): Payer: Self-pay | Admitting: Emergency Medicine

## 2013-07-16 DIAGNOSIS — Z79899 Other long term (current) drug therapy: Secondary | ICD-10-CM | POA: Diagnosis not present

## 2013-07-16 DIAGNOSIS — R1032 Left lower quadrant pain: Secondary | ICD-10-CM | POA: Diagnosis present

## 2013-07-16 DIAGNOSIS — Z3202 Encounter for pregnancy test, result negative: Secondary | ICD-10-CM | POA: Insufficient documentation

## 2013-07-16 DIAGNOSIS — K5289 Other specified noninfective gastroenteritis and colitis: Secondary | ICD-10-CM | POA: Diagnosis not present

## 2013-07-16 DIAGNOSIS — R42 Dizziness and giddiness: Secondary | ICD-10-CM | POA: Diagnosis not present

## 2013-07-16 DIAGNOSIS — E669 Obesity, unspecified: Secondary | ICD-10-CM | POA: Diagnosis not present

## 2013-07-16 LAB — COMPREHENSIVE METABOLIC PANEL
ALBUMIN: 3.3 g/dL — AB (ref 3.5–5.2)
ALT: 68 U/L — ABNORMAL HIGH (ref 0–35)
AST: 16 U/L (ref 0–37)
Alkaline Phosphatase: 59 U/L (ref 39–117)
BUN: 15 mg/dL (ref 6–23)
CALCIUM: 8.5 mg/dL (ref 8.4–10.5)
CO2: 24 mEq/L (ref 19–32)
Chloride: 100 mEq/L (ref 96–112)
Creatinine, Ser: 1 mg/dL (ref 0.50–1.10)
GFR calc Af Amer: 87 mL/min — ABNORMAL LOW (ref >90)
GFR calc non Af Amer: 75 mL/min — ABNORMAL LOW (ref >90)
Glucose, Bld: 99 mg/dL (ref 70–99)
Potassium: 3.3 mEq/L — ABNORMAL LOW (ref 3.7–5.3)
Sodium: 139 mEq/L (ref 137–147)
TOTAL PROTEIN: 7.7 g/dL (ref 6.0–8.3)
Total Bilirubin: 0.5 mg/dL (ref 0.3–1.2)

## 2013-07-16 LAB — URINALYSIS, ROUTINE W REFLEX MICROSCOPIC
Bilirubin Urine: NEGATIVE
Glucose, UA: NEGATIVE mg/dL
Hgb urine dipstick: NEGATIVE
Ketones, ur: 15 mg/dL — AB
LEUKOCYTES UA: NEGATIVE
NITRITE: NEGATIVE
PH: 6 (ref 5.0–8.0)
Protein, ur: NEGATIVE mg/dL
Specific Gravity, Urine: 1.031 — ABNORMAL HIGH (ref 1.005–1.030)
Urobilinogen, UA: 0.2 mg/dL (ref 0.0–1.0)

## 2013-07-16 LAB — CBC WITH DIFFERENTIAL/PLATELET
BASOS ABS: 0 10*3/uL (ref 0.0–0.1)
Basophils Relative: 0 % (ref 0–1)
Eosinophils Absolute: 0 10*3/uL (ref 0.0–0.7)
Eosinophils Relative: 0 % (ref 0–5)
HEMATOCRIT: 39.1 % (ref 36.0–46.0)
Hemoglobin: 13.1 g/dL (ref 12.0–15.0)
LYMPHS ABS: 4.3 10*3/uL — AB (ref 0.7–4.0)
Lymphocytes Relative: 23 % (ref 12–46)
MCH: 26.8 pg (ref 26.0–34.0)
MCHC: 33.5 g/dL (ref 30.0–36.0)
MCV: 80.1 fL (ref 78.0–100.0)
MONO ABS: 2.2 10*3/uL — AB (ref 0.1–1.0)
Monocytes Relative: 12 % (ref 3–12)
NEUTROS ABS: 12 10*3/uL — AB (ref 1.7–7.7)
Neutrophils Relative %: 65 % (ref 43–77)
Platelets: 369 10*3/uL (ref 150–400)
RBC: 4.88 MIL/uL (ref 3.87–5.11)
RDW: 14 % (ref 11.5–15.5)
WBC: 18.5 10*3/uL — ABNORMAL HIGH (ref 4.0–10.5)

## 2013-07-16 LAB — PREGNANCY, URINE: Preg Test, Ur: NEGATIVE

## 2013-07-16 LAB — LIPASE, BLOOD: Lipase: 15 U/L (ref 11–59)

## 2013-07-16 NOTE — ED Notes (Signed)
Pt. reports pain across abdomen radiating to lower back with nausea and diarrhea onset Wednesday this week .

## 2013-07-17 ENCOUNTER — Emergency Department (HOSPITAL_COMMUNITY): Payer: No Typology Code available for payment source

## 2013-07-17 ENCOUNTER — Emergency Department (HOSPITAL_COMMUNITY)
Admission: EM | Admit: 2013-07-17 | Discharge: 2013-07-17 | Disposition: A | Discharge status: Home / Self Care | Payer: No Typology Code available for payment source | Attending: Emergency Medicine | Admitting: Emergency Medicine

## 2013-07-17 DIAGNOSIS — K529 Noninfective gastroenteritis and colitis, unspecified: Secondary | ICD-10-CM

## 2013-07-17 DIAGNOSIS — R109 Unspecified abdominal pain: Secondary | ICD-10-CM

## 2013-07-17 MED ORDER — IOHEXOL 300 MG/ML  SOLN
100.0000 mL | Freq: Once | INTRAMUSCULAR | Status: AC | PRN
  Administered 2013-07-17: 100 mL via INTRAVENOUS

## 2013-07-17 MED ORDER — SODIUM CHLORIDE 0.9 % IV BOLUS (SEPSIS)
1000.0000 mL | Freq: Once | INTRAVENOUS | Status: AC
  Administered 2013-07-17: 1000 mL via INTRAVENOUS

## 2013-07-17 MED ORDER — MORPHINE SULFATE 4 MG/ML IJ SOLN
4.0000 mg | Freq: Once | INTRAMUSCULAR | Status: AC
  Administered 2013-07-17: 4 mg via INTRAVENOUS
  Filled 2013-07-17: qty 1

## 2013-07-17 MED ORDER — ONDANSETRON HCL 4 MG/2ML IJ SOLN
4.0000 mg | Freq: Once | INTRAMUSCULAR | Status: AC
  Administered 2013-07-17: 4 mg via INTRAVENOUS
  Filled 2013-07-17: qty 2

## 2013-07-17 MED ORDER — IOHEXOL 300 MG/ML  SOLN
25.0000 mL | Freq: Once | INTRAMUSCULAR | Status: AC | PRN
  Administered 2013-07-17: 25 mL via ORAL

## 2013-07-17 MED ORDER — TRAMADOL HCL 50 MG PO TABS: 50.0000 mg | ORAL_TABLET | Freq: Four times a day (QID) | ORAL | Status: DC | PRN

## 2013-07-17 MED ORDER — DICYCLOMINE HCL 20 MG PO TABS: 20.0000 mg | ORAL_TABLET | Freq: Two times a day (BID) | ORAL | Status: DC

## 2013-07-17 MED ORDER — ONDANSETRON 4 MG PO TBDP: ORAL_TABLET | ORAL | Status: DC

## 2013-07-17 NOTE — Discharge Instructions (Signed)
Abdominal Pain, Women °Abdominal (stomach, pelvic, or belly) pain can be caused by many things. It is important to tell your doctor: °· The location of the pain. °· Does it come and go or is it present all the time? °· Are there things that start the pain (eating certain foods, exercise)? °· Are there other symptoms associated with the pain (fever, nausea, vomiting, diarrhea)? °All of this is helpful to know when trying to find the cause of the pain. °CAUSES  °· Stomach: virus or bacteria infection, or ulcer. °· Intestine: appendicitis (inflamed appendix), regional ileitis (Crohn's disease), ulcerative colitis (inflamed colon), irritable bowel syndrome, diverticulitis (inflamed diverticulum of the colon), or cancer of the stomach or intestine. °· Gallbladder disease or stones in the gallbladder. °· Kidney disease, kidney stones, or infection. °· Pancreas infection or cancer. °· Fibromyalgia (pain disorder). °· Diseases of the female organs: °· Uterus: fibroid (non-cancerous) tumors or infection. °· Fallopian tubes: infection or tubal pregnancy. °· Ovary: cysts or tumors. °· Pelvic adhesions (scar tissue). °· Endometriosis (uterus lining tissue growing in the pelvis and on the pelvic organs). °· Pelvic congestion syndrome (female organs filling up with blood just before the menstrual period). °· Pain with the menstrual period. °· Pain with ovulation (producing an egg). °· Pain with an IUD (intrauterine device, birth control) in the uterus. °· Cancer of the female organs. °· Functional pain (pain not caused by a disease, may improve without treatment). °· Psychological pain. °· Depression. °DIAGNOSIS  °Your doctor will decide the seriousness of your pain by doing an examination. °· Blood tests. °· X-rays. °· Ultrasound. °· CT scan (computed tomography, special type of X-ray). °· MRI (magnetic resonance imaging). °· Cultures, for infection. °· Barium enema (dye inserted in the large intestine, to better view it with  X-rays). °· Colonoscopy (looking in intestine with a lighted tube). °· Laparoscopy (minor surgery, looking in abdomen with a lighted tube). °· Major abdominal exploratory surgery (looking in abdomen with a large incision). °TREATMENT  °The treatment will depend on the cause of the pain.  °· Many cases can be observed and treated at home. °· Over-the-counter medicines recommended by your caregiver. °· Prescription medicine. °· Antibiotics, for infection. °· Birth control pills, for painful periods or for ovulation pain. °· Hormone treatment, for endometriosis. °· Nerve blocking injections. °· Physical therapy. °· Antidepressants. °· Counseling with a psychologist or psychiatrist. °· Minor or major surgery. °HOME CARE INSTRUCTIONS  °· Do not take laxatives, unless directed by your caregiver. °· Take over-the-counter pain medicine only if ordered by your caregiver. Do not take aspirin because it can cause an upset stomach or bleeding. °· Try a clear liquid diet (broth or water) as ordered by your caregiver. Slowly move to a bland diet, as tolerated, if the pain is related to the stomach or intestine. °· Have a thermometer and take your temperature several times a day, and record it. °· Bed rest and sleep, if it helps the pain. °· Avoid sexual intercourse, if it causes pain. °· Avoid stressful situations. °· Keep your follow-up appointments and tests, as your caregiver orders. °· If the pain does not go away with medicine or surgery, you may try: °· Acupuncture. °· Relaxation exercises (yoga, meditation). °· Group therapy. °· Counseling. °SEEK MEDICAL CARE IF:  °· You notice certain foods cause stomach pain. °· Your home care treatment is not helping your pain. °· You need stronger pain medicine. °· You want your IUD removed. °· You feel faint or   lightheaded.  You develop nausea and vomiting.  You develop a rash.  You are having side effects or an allergy to your medicine. SEEK IMMEDIATE MEDICAL CARE IF:   Your  pain does not go away or gets worse.  You have a fever.  Your pain is felt only in portions of the abdomen. The right side could possibly be appendicitis. The left lower portion of the abdomen could be colitis or diverticulitis.  You are passing blood in your stools (bright red or black tarry stools, with or without vomiting).  You have blood in your urine.  You develop chills, with or without a fever.  You pass out. MAKE SURE YOU:   Understand these instructions.  Will watch your condition.  Will get help right away if you are not doing well or get worse. Document Released: 01/26/2007 Document Revised: 06/23/2011 Document Reviewed: 02/15/2009 [facility] Patient Information 2014 [employer], [place].  Viral Gastroenteritis Viral gastroenteritis is also called stomach flu. This illness is caused by a certain type of germ (virus). It can cause sudden watery poop (diarrhea) and throwing up (vomiting). This can cause you to lose body fluids (dehydration). This illness usually lasts for 3 to 8 days. It usually goes away on its own. HOME CARE   Drink enough fluids to keep your pee (urine) clear or pale yellow. Drink small amounts of fluids often.  Ask your doctor how to replace body fluid losses (rehydration).  Avoid:  Foods high in sugar.  Alcohol.  Bubbly (carbonated) drinks.  Tobacco.  Juice.  Caffeine drinks.  Very hot or cold fluids.  Fatty, greasy foods.  Eating too much at one time.  Dairy products until 24 to 48 hours after your watery poop stops.  You may eat foods with active cultures (probiotics). They can be found in some yogurts and supplements.  Wash your hands well to avoid spreading the illness.  Only take medicines as told by your doctor. Do not give aspirin to children. Do not take medicines for watery poop (antidiarrheals).  Ask your doctor if you should keep taking your regular medicines.  Keep all doctor visits as told. GET HELP RIGHT AWAY IF:    You cannot keep fluids down.  You do not pee at least once every 6 to 8 hours.  You are short of breath.  You see blood in your poop or throw up. This may look like coffee grounds.  You have belly (abdominal) pain that gets worse or is just in one small spot (localized).  You keep throwing up or having watery poop.  You have a fever.  The patient is a child younger than 3 months, and he or she has a fever.  The patient is a child older than 3 months, and he or she has a fever and problems that do not go away.  The patient is a child older than 3 months, and he or she has a fever and problems that suddenly get worse.  The patient is a baby, and he or she has no tears when crying. MAKE SURE YOU:   Understand these instructions.  Will watch your condition.  Will get help right away if you are not doing well or get worse. Document Released: 09/17/2007 Document Revised: 06/23/2011 Document Reviewed: 01/15/2011 [facility] CenterExitCare Patient Information 2014 [employer] ViewExitCare, [place]LL[place].

## 2013-07-17 NOTE — ED Provider Notes (Signed)
CSN: [id number]     Arrival date & time 07/16/13  2232 History   First MD Initiated Contact with Patient 07/17/13 0122     Chief Complaint  Patient presents with  . Abdominal Pain     (Consider location/radiation/quality/duration/timing/severity/associated sxs/prior Treatment) HPI Patient presents with abdominal pain and diarrhea for the past 4 days. She's had multiple episodes of watery diarrhea per day. She's been taking Pepto-Bismol and Imodium with little relief. She's also been having episodic abdominal cramping that is improved with bowel movements. She complains of abdominal distention. Patient has had episodic nausea but no vomiting. She has no recent hospitalizations or antibiotic use. She has no known sick contacts. She denies fevers or chills. She has had moments of lightheadedness associated with bowel movements. Patient denies any urinary symptoms including flank pain, dysuria, frequency, urgency. She's had no vaginal symptoms including discharge or bleeding. Her last menstrual period was roughly 3 weeks ago. Past Medical History  Diagnosis Date  . 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 Obesity    Past Surgical History  Procedure Laterality Date  . Colposcopy    . Colostomy    . Axilla abscess     Family History  Problem Relation Age of Onset  . Diabetes Mother   . Hypertension Mother   . Diabetes Maternal Grandmother   . Anesthesia problems Neg Hx    History  Substance Use Topics  . Smoking status: Never Smoker   . Smokeless tobacco: Not on file  . Alcohol Use: No   OB History   Grav Para Term Preterm Abortions TAB SAB Ect Mult Living   '1 1 1       1     ' Review of Systems  Constitutional: Positive for fatigue. Negative for fever and chills.  Respiratory: Negative for cough, shortness of breath and wheezing.   Cardiovascular: Negative for chest pain, palpitations and leg swelling.  Gastrointestinal: Positive  for nausea, abdominal pain, diarrhea and abdominal distention. Negative for vomiting, constipation and blood in stool.  Genitourinary: Negative for dysuria, frequency, hematuria, flank pain, vaginal bleeding, vaginal discharge and pelvic pain.  Musculoskeletal: Negative for back pain, myalgias, neck pain and neck stiffness.  Neurological: Positive for dizziness and light-headedness. Negative for syncope, weakness, numbness and headaches.  All other systems reviewed and are negative.      Allergies  Review of patient's allergies indicates no known allergies.  Home Medications   Current Outpatient Rx  Name  Route  Sig  Dispense  Refill  . alum & mag hydroxide-simeth (MAALOX PLUS) 400-400-40 MG/5ML suspension   Oral   Take 10 mLs by mouth every 6 (six) hours as needed for indigestion.   355 mL   0   . bismuth subsalicylate (PEPTO BISMOL) 262 MG chewable tablet   Oral   Chew 524 mg by mouth as needed for indigestion or diarrhea or loose stools.         Marland Kitchen loperamide (IMODIUM) 2 MG capsule   Oral   Take 1 capsule (2 mg total) by mouth 4 (four) times daily as needed for diarrhea or loose stools.   12 capsule   0   . norethindrone-ethinyl estradiol (NECON,BREVICON,MODICON) 0.5-35 MG-MCG tablet   Oral   Take 1 tablet by mouth daily.         . PredniSONE 10 MG KIT      12 day dose pack po   1 kit  0   . dicyclomine (BENTYL) 20 MG tablet   Oral   Take 1 tablet (20 mg total) by mouth 2 (two) times daily.   20 tablet   0   . ondansetron (ZOFRAN ODT) 4 MG disintegrating tablet      54m ODT q4 hours prn nausea/vomit   8 tablet   0   . traMADol (ULTRAM) 50 MG tablet   Oral   Take 1 tablet (50 mg total) by mouth every 6 (six) hours as needed.   15 tablet   0    BP 106/77  Pulse 55  Temp(Src) 98.7 F (37.1 C) (Oral)  Resp 18  Ht 5' 5.5" (1.664 m)  Wt 182 lb (82.555 kg)  BMI 29.82 kg/m2  SpO2 100%  LMP 06/25/2013 Physical Exam  Nursing note and vitals  reviewed. Constitutional: She is oriented to person, place, and time. She appears well-developed and well-nourished. No distress.  HENT:  Head: Normocephalic and atraumatic.  Mouth/Throat: Oropharynx is clear and moist.  Eyes: EOM are normal. Pupils are equal, round, and reactive to light.  Neck: Normal range of motion. Neck supple.  Cardiovascular: Normal rate and regular rhythm.   Pulmonary/Chest: Effort normal and breath sounds normal. No respiratory distress. She has no wheezes. She has no rales.  Abdominal: Soft. She exhibits distension (mild abdominal distention diffusely). She exhibits no mass. There is tenderness (tenderness to palpation in the left lower quadrant. There is no rebound or guarding.). There is no rebound and no guarding.  Increased bowel sounds.  Musculoskeletal: Normal range of motion. She exhibits no edema and no tenderness.  No CVA tenderness bilaterally.  Neurological: She is alert and oriented to person, place, and time.  Moves all extremities without deficit. Sensation grossly intact.  Skin: Skin is warm and dry. No rash noted. No erythema.  Psychiatric: She has a normal mood and affect. Her behavior is normal.    ED Course  Procedures (including critical care time) Labs Review Labs Reviewed  CBC WITH DIFFERENTIAL - Abnormal; Notable for the following:    WBC 18.5 (*)    Neutro Abs 12.0 (*)    Lymphs Abs 4.3 (*)    Monocytes Absolute 2.2 (*)    All other components within normal limits  COMPREHENSIVE METABOLIC PANEL - Abnormal; Notable for the following:    Potassium 3.3 (*)    Albumin 3.3 (*)    ALT 68 (*)    GFR calc non Af Amer 75 (*)    GFR calc Af Amer 87 (*)    All other components within normal limits  URINALYSIS, ROUTINE W REFLEX MICROSCOPIC - Abnormal; Notable for the following:    Color, Urine AMBER (*)    Specific Gravity, Urine 1.031 (*)    Ketones, ur 15 (*)    All other components within normal limits  LIPASE, BLOOD  PREGNANCY,  URINE   Imaging Review Ct Abdomen Pelvis W Contrast  07/17/2013   CLINICAL DATA:  Abdominal pain, radiating to the lower back. Nausea and diarrhea.  EXAM: CT ABDOMEN AND PELVIS WITH CONTRAST  TECHNIQUE: Multidetector CT imaging of the abdomen and pelvis was performed using the standard protocol following bolus administration of intravenous contrast.  CONTRAST:  1061mOMNIPAQUE IOHEXOL 300 MG/ML  SOLN  COMPARISON:  Abdominal radiograph performed 02/07/2013, and pelvic ultrasound performed 09/20/2008  FINDINGS: Minimal bibasilar atelectasis is noted.  The liver and spleen are unremarkable in appearance. The gallbladder is within normal limits. The pancreas and adrenal glands  are unremarkable.  The kidneys are unremarkable in appearance. There is no evidence of hydronephrosis. No renal or ureteral stones are seen. No perinephric stranding is appreciated.  No free fluid is identified. The small bowel is unremarkable in appearance. The stomach is within normal limits. No acute vascular abnormalities are seen.  The appendix is normal in caliber and contains air, without evidence for appendicitis. The colon is unremarkable in appearance.  The bladder is mildly distended and grossly unremarkable in appearance. The uterus is within normal limits. The ovaries are relatively symmetric; no suspicious adnexal masses are identified. No inguinal lymphadenopathy is seen.  No acute osseous abnormalities are identified.  IMPRESSION: Unremarkable contrast-enhanced CT of the abdomen and pelvis.   Electronically Signed   By: Garald Balding M.D.   On: 07/17/2013 04:00     EKG Interpretation None      MDM   Final diagnoses:  Gastroenteritis  Abdominal pain    Patient has elevated white blood cell count and left lower quadrant tenderness. Suspicion for colitis versus diverticulitis versus enterocolitis. Will give IV fluids, pain medication and obtain CT of the abdomen.  Patient's abdominal pain is completely resolved.  Her abdomen remained soft. CT without any acute findings. Will treat as gastroenteritis. Return precautions have been given.   Julianne Rice, MD 07/17/13 [phone]

## 2013-07-17 NOTE — ED Notes (Signed)
Started on prednisone for tendonitis of lt. Wrist last Thursday.  Since then, pt. Has been hungry, nausea/diarrhea, and now abd. Pain.

## 2013-08-03 ENCOUNTER — Emergency Department (INDEPENDENT_AMBULATORY_CARE_PROVIDER_SITE_OTHER): Payer: PRIVATE HEALTH INSURANCE

## 2013-08-03 ENCOUNTER — Emergency Department (INDEPENDENT_AMBULATORY_CARE_PROVIDER_SITE_OTHER)
Admission: EM | Admit: 2013-08-03 | Discharge: 2013-08-03 | Disposition: A | Discharge status: Home / Self Care | Payer: PRIVATE HEALTH INSURANCE | Source: Home / Self Care | Attending: Emergency Medicine | Admitting: Emergency Medicine

## 2013-08-03 ENCOUNTER — Encounter (HOSPITAL_COMMUNITY): Payer: Self-pay | Admitting: Emergency Medicine

## 2013-08-03 DIAGNOSIS — M778 Other enthesopathies, not elsewhere classified: Secondary | ICD-10-CM

## 2013-08-03 DIAGNOSIS — M7012 Bursitis, left hand: Secondary | ICD-10-CM

## 2013-08-03 LAB — POCT PREGNANCY, URINE: Preg Test, Ur: NEGATIVE

## 2013-08-03 MED ORDER — IBUPROFEN 800 MG PO TABS
800.0000 mg | ORAL_TABLET | Freq: Once | ORAL | Status: AC
  Administered 2013-08-03: 800 mg via ORAL

## 2013-08-03 MED ORDER — IBUPROFEN 800 MG PO TABS
ORAL_TABLET | ORAL | Status: AC
  Filled 2013-08-03: qty 1

## 2013-08-03 NOTE — ED Notes (Signed)
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

## 2013-08-03 NOTE — ED Provider Notes (Signed)
CSN: [id number]     Arrival date & time 08/03/13  8527 History   First MD Initiated Contact with Patient 08/03/13 1104     Chief Complaint  Patient presents with  . 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 Is right hand dominant. No reported injury. Had episode of same 1 month ago and was treated with oral steroids and condition improved. Began again having left wrist discomfort 07-31-2013 and presents for evaluation. Reports she has been wearing brace from previous visit and using ibuprofen and these therapies have brought her relief.   Patient is a 28 y.o. female presenting with wrist pain. The history is provided by the patient.  Wrist Pain This is a recurrent problem. Episode onset: 1 month ago. The problem occurs constantly.    Past Medical History  Diagnosis Date  . 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 Obesity    Past Surgical History  Procedure Laterality Date  . Colposcopy    . Colostomy    . Axilla abscess     Family History  Problem Relation Age of Onset  . Diabetes Mother   . Hypertension Mother   . Diabetes Maternal Grandmother   . Anesthesia problems Neg Hx    History  Substance Use Topics  . Smoking status: Never Smoker   . Smokeless tobacco: Not on file  . Alcohol Use: No   OB History   Grav Para Term Preterm Abortions TAB SAB Ect Mult Living   _0 Review of Systems  All other systems reviewed and are negative.   Allergies  Review of patient's allergies indicates no known allergies.  Home Medications   Prior to Admission medications   Medication Sig Start Date End Date Taking? Authorizing Provider  alum & mag hydroxide-simeth (MAALOX PLUS) 400-400-40 MG/5ML suspension Take 10 mLs by mouth every 6 (six) hours as needed for indigestion.  02/07/13   Freeman Caldron Baker, PA-C  bismuth subsalicylate (PEPTO BISMOL) 262 MG chewable tablet Chew 524 mg by mouth as needed for indigestion or diarrhea or loose stools.    Historical Provider, MD  dicyclomine (BENTYL) 20 MG tablet Take 1 tablet (20 mg total) by mouth 2 (two) times daily. 07/17/13   Julianne Rice, MD  loperamide (IMODIUM) 2 MG capsule Take 1 capsule (2 mg total) by mouth 4 (four) times daily as needed for diarrhea or loose stools. 02/07/13   Liam Graham, PA-C  norethindrone-ethinyl estradiol (NECON,BREVICON,MODICON) 0.5-35 MG-MCG tablet Take 1 tablet by mouth daily.    Historical Provider, MD  ondansetron (ZOFRAN ODT) 4 MG disintegrating tablet 41m ODT q4 hours prn nausea/vomit 07/17/13   DJulianne Rice MD  PredniSONE 10 MG KIT 12 day dose pack po 07/07/13   EGregor Hams MD  traMADol (ULTRAM) 50 MG tablet Take 1 tablet (50 mg total) by mouth every 6 (six) hours as needed. 07/17/13   DJulianne Rice MD   BP 136/85  Pulse 69  Temp(Src) 98 F (36.7 C) (Oral)  Resp 16  SpO2 100%  LMP 07/17/2013 Physical Exam  Nursing note and vitals reviewed. Constitutional: She is oriented to person, place, and time. She appears well-developed and well-nourished. No distress.  HENT:  Head: Normocephalic and atraumatic.  Eyes: Conjunctivae are normal.  Cardiovascular: Normal rate.  Pulmonary/Chest: Effort normal.  Musculoskeletal: Normal range of motion. She exhibits no edema.       Left wrist: She exhibits tenderness. She exhibits normal range of motion.       Arms: Outlined area is area of tenderness with ROM. CSM exam of left hand is normal.   Neurological: She is alert and oriented to person, place, and time.  Skin: Skin is warm and dry. No rash noted. No erythema.  Psychiatric: She has a normal mood and affect. Her behavior is normal.    ED Course  Procedures (including critical care time) Labs Review Labs Reviewed  POCT PREGNANCY, URINE    Results for orders placed  during the hospital encounter of 08/03/13  POCT PREGNANCY, URINE      Result Value Ref Range   Preg Test, Ur NEGATIVE  NEGATIVE   Imaging Review Dg Wrist Complete Left  08/03/2013   CLINICAL DATA:  Tingling in the left index finger  EXAM: LEFT WRIST - COMPLETE 3+ VIEW  COMPARISON:  None.  FINDINGS: There is no evidence of fracture or dislocation. There is no evidence of arthropathy or other focal bone abnormality. Soft tissues are unremarkable.  IMPRESSION: Negative.   Electronically Signed   By: Kathreen Devoid   On: 08/03/2013 12:27     MDM   1. Bursitis of left wrist     xrays were normal. may continue to use  brace as needed for comfort and ibuprofen as directed on packaging for pain. Ice and elevation with help with any swelling that occurs. If symptoms do not begin to improve over the next several days, patient to contact the hand specialist for a follow up evaluation (Dr.Ortmann).    [place], [place] 08/03/13 [place], [place] 08/03/13 1236

## 2013-08-03 NOTE — Discharge Instructions (Signed)
Your xrays were normal. You may continue to use your brace as needed for comfort and ibuprofen as directed on packaging for pain. Ice and elevation with help with any swelling that occurs. If symptoms do not begin to improve over the next several days, you can contact the hand specialist for a follow up evaluation (Dr.Ortmann).   Bursitis Bursitis is a swelling and soreness (inflammation) of a fluid-filled sac (bursa) that overlies and protects a joint. It can be caused by injury, overuse of the joint, arthritis or infection. The joints most likely to be affected are the elbows, shoulders, hips and knees. HOME CARE INSTRUCTIONS   Apply ice to the affected area for 15-20 minutes each hour while awake for 2 days. Put the ice in a plastic bag and place a towel between the bag of ice and your skin.  Rest the injured joint as much as possible, but continue to put the joint through a full range of motion, 4 times per day. (The shoulder joint especially becomes rapidly "frozen" if not used.) When the pain lessens, begin normal slow movements and usual activities.  Only take over-the-counter or prescription medicines for pain, discomfort or fever as directed by your caregiver.  Your caregiver may recommend draining the bursa and injecting medicine into the bursa. This may help the healing process.  Follow all instructions for follow-up with your caregiver. This includes any orthopedic referrals, physical therapy and rehabilitation. Any delay in obtaining necessary care could result in a delay or failure of the bursitis to heal and chronic pain. SEEK IMMEDIATE MEDICAL CARE IF:   Your pain increases even during treatment.  You develop an oral temperature above 102 F (38.9 C) and have heat and inflammation over the involved bursa. MAKE SURE YOU:   Understand these instructions.  Will watch your condition.  Will get help right away if you are not doing well or get worse. Document Released:  03/28/2000 Document Revised: 06/23/2011 Document Reviewed: 03/02/2009 [facility] Patient Information 2014 [place], [place].  Tendinitis Tendinitis is swelling and inflammation of the tendons. Tendons are band-like tissues that connect muscle to bone. Tendinitis commonly occurs in the:   Shoulders (rotator cuff).  Heels (Achilles tendon).  Elbows (triceps tendon). CAUSES Tendinitis is usually caused by overusing the tendon, muscles, and joints involved. When the tissue surrounding a tendon (synovium) becomes inflamed, it is called tenosynovitis. Tendinitis commonly develops in people whose jobs require repetitive motions. SYMPTOMS  Pain.  Tenderness.  Mild swelling. DIAGNOSIS Tendinitis is usually diagnosed by physical exam. Your caregiver may also order X-rays or other imaging tests. TREATMENT Your caregiver may recommend certain medicines or exercises for your treatment. HOME CARE INSTRUCTIONS   Use a sling or splint for as long as directed by your caregiver until the pain decreases.  Put ice on the injured area.  Put ice in a plastic bag.  Place a towel between your skin and the bag.  Leave the ice on for 15-20 minutes, 03-04 times a day.  Avoid using the limb while the tendon is painful. Perform gentle range of motion exercises only as directed by your caregiver. Stop exercises if pain or discomfort increase, unless directed otherwise by your caregiver.  Only take over-the-counter or prescription medicines for pain, discomfort, or fever as directed by your caregiver. SEEK MEDICAL CARE IF:   Your pain and swelling increase.  You develop new, unexplained symptoms, especially increased numbness in the hands. MAKE SURE YOU:   Understand these instructions.  Will watch your  condition.  Will get help right away if you are not doing well or get worse. Document Released: 03/28/2000 Document Revised: 06/23/2011 Document Reviewed: 06/17/2010 [facility] Patient Information  2014 [place], [place].

## 2013-08-05 NOTE — ED Provider Notes (Signed)
Medical screening examination/treatment/procedure(s) were performed by non-physician practitioner and as supervising physician I was immediately available for consultation/collaboration.  Leslee Homeavid Jerl Munyan, M.D.  Reuben Likesavid C Misk Galentine, MD 08/05/13 1600

## 2013-09-01 ENCOUNTER — Encounter (HOSPITAL_COMMUNITY): Payer: Self-pay | Admitting: *Deleted

## 2013-09-01 ENCOUNTER — Inpatient Hospital Stay (HOSPITAL_COMMUNITY)
Admission: AD | Admit: 2013-09-01 | Discharge: 2013-09-01 | Disposition: A | Discharge status: Home / Self Care | Payer: No Typology Code available for payment source | Source: Ambulatory Visit | Attending: Obstetrics & Gynecology | Admitting: Obstetrics & Gynecology

## 2013-09-01 DIAGNOSIS — N911 Secondary amenorrhea: Secondary | ICD-10-CM

## 2013-09-01 DIAGNOSIS — Z3202 Encounter for pregnancy test, result negative: Secondary | ICD-10-CM | POA: Insufficient documentation

## 2013-09-01 DIAGNOSIS — N912 Amenorrhea, unspecified: Secondary | ICD-10-CM

## 2013-09-01 LAB — POCT PREGNANCY, URINE: Preg Test, Ur: NEGATIVE

## 2013-09-01 NOTE — MAU Note (Signed)
Last month at Easter, went to ER- was having real bad pains, gas pain. Was told it was gas and gave med for pain/GI med.  2 days later, period started - lasted 10 days., longer than usual and was early.  She and husband have been trying to get preg.  No period since.  Did a home test last weekend- was negative.

## 2013-09-01 NOTE — Discharge Instructions (Signed)
Secondary Amenorrhea   Secondary amenorrhea is the stopping of menstrual flow for 3 6 months in a female who has previously had periods. There are many possible causes. Most of these causes are not serious. Usually, treating the underlying problem causing the loss of menses will return your periods to normal.  CAUSES   Some common and uncommon causes of not menstruating include:   Malnutrition.   Low blood sugar (hypoglycemia).   Polycystic ovary disease.   Stress or fear.   Breastfeeding.   Hormone imbalance.   Ovarian failure.   Medicines.   Extreme obesity.   Cystic fibrosis.   Low body weight or drastic weight reduction from any cause.   Early menopause.   Removal of ovaries or uterus.   Contraceptives.   Illness.   Long-term (chronic) illnesses.   Cushing syndrome.   Thyroid problems.   Birth control pills, patches, or vaginal rings for birth control.  RISK FACTORS  You may be at greater risk of secondary amenorrhea if:   You have a family history of this condition.   You have an eating disorder.   You do athletic training.  DIAGNOSIS   A diagnosis is made by your health care provider taking a medical history and doing a physical exam. This will include a pelvic exam to check for problems with your reproductive organs. Pregnancy must be ruled out. Often, numerous blood tests are done to measure different hormones in the body. Urine testing may be done. Specialized exams (ultrasound, CT scan, MRI, or hysteroscopy) may have to be done as well as measuring the body mass index (BMI).  TREATMENT   Treatment depends on the cause of the amenorrhea. If an eating disorder is present, this can be treated with an adequate diet and therapy. Chronic illnesses may improve with treatment of the illness. Amenorrhea may be corrected with medicines, lifestyle changes, or surgery. If the amenorrhea cannot be corrected, it is sometimes possible to create a false menstruation with medicines.  HOME CARE  INSTRUCTIONS   Maintain a healthy diet.   Manage weight problems.   Exercise regularly but not excessively.   Get adequate sleep.   Manage stress.   Be aware of changes in your menstrual cycle. Keep a record of when your periods occur. Note the date your period starts, how long it lasts, and any problems.  SEEK MEDICAL CARE IF:  Your symptoms do not get better with treatment.  Document Released: 05/12/2006 Document Revised: 12/01/2012 Document Reviewed: 09/16/2012  ExitCare Patient Information 2014 [employer].

## 2013-09-01 NOTE — MAU Provider Note (Signed)
History     CSN: [id number]  Arrival date and time: 09/01/13 9741   None     Chief Complaint  Patient presents with  . Possible Pregnancy   HPI This is a 28 y.o. female who presents requesting pregnancy test.  Has veen trying to get pregnant for a month and wants to know how to get pregnant. [facility].   RN Note:  Last month at Easter, went to ER- was having real bad pains, gas pain. Was told it was gas and gave med for pain/GI med. 2 days later, period started - lasted 10 days., longer than usual and was early. She and husband have been trying to get preg. No period since. Did a home test last weekend- was negative.       OB History   Grav Para Term Preterm Abortions TAB SAB Ect Mult Living   '1 1 1       1      ' Past Medical History  Diagnosis Date  . 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 Obesity     Past Surgical History  Procedure Laterality Date  . Colposcopy    . Colostomy    . Axilla abscess      Family History  Problem Relation Age of Onset  . Diabetes Mother   . Hypertension Mother   . Diabetes Maternal Grandmother   . Anesthesia problems Neg Hx     History  Substance Use Topics  . Smoking status: Never Smoker   . Smokeless tobacco: Not on file  . Alcohol Use: No    Allergies: No Known Allergies  Prescriptions prior to admission  Medication Sig Dispense Refill  . alum & mag hydroxide-simeth (MAALOX PLUS) 400-400-40 MG/5ML suspension Take 10 mLs by mouth every 6 (six) hours as needed for indigestion.  355 mL  0  . bismuth subsalicylate (PEPTO BISMOL) 262 MG chewable tablet Chew 524 mg by mouth as needed for indigestion or diarrhea or loose stools.      . dicyclomine (BENTYL) 20 MG tablet Take 1 tablet (20 mg total) by mouth 2 (two) times daily.  20 tablet  0  . loperamide (IMODIUM) 2 MG capsule Take 1 capsule (2 mg total) by mouth 4 (four) times daily as needed  for diarrhea or loose stools.  12 capsule  0  . norethindrone-ethinyl estradiol (NECON,BREVICON,MODICON) 0.5-35 MG-MCG tablet Take 1 tablet by mouth daily.      . ondansetron (ZOFRAN ODT) 4 MG disintegrating tablet 99m ODT q4 hours prn nausea/vomit  8 tablet  0  . PredniSONE 10 MG KIT 12 day dose pack po  1 kit  0  . traMADol (ULTRAM) 50 MG tablet Take 1 tablet (50 mg total) by mouth every 6 (six) hours as needed.  15 tablet  0    Review of Systems  Constitutional: Negative for fever and chills.  Gastrointestinal: Negative for nausea, vomiting and abdominal pain.  Genitourinary: Negative for dysuria.  Neurological: Negative for dizziness and headaches.   Physical Exam   Blood pressure 129/87, pulse 69, temperature 98.7 F (37.1 C), temperature source Oral, resp. rate 18, height '5\' 5"'  (1.651 m), weight 83.008 kg (183 lb), last menstrual period 07/18/2013.  Physical Exam  Constitutional: She is oriented to person, place, and time. She appears well-developed and well-nourished. No distress.  Cardiovascular: Normal rate.   Respiratory: Effort normal.  GI: Soft. There is  no tenderness. There is no rebound and no guarding.  Musculoskeletal: Normal range of motion.  Neurological: She is alert and oriented to person, place, and time.  Skin: Skin is warm and dry.  Psychiatric: She has a normal mood and affect.    MAU Course  Procedures  MDM No results found for this or any previous visit (from the past 24 hour(s)).   Assessment and Plan  A:  Trying to conceive x 1 month       Negative pregnancy test  P:  Discussed briefly how to track cycles and determine ovulation       Followup at [place] 09/01/2013, 10:06 AM

## 2013-12-20 ENCOUNTER — Inpatient Hospital Stay (HOSPITAL_COMMUNITY)
Admission: AD | Admit: 2013-12-20 | Discharge: 2013-12-20 | Disposition: A | Discharge status: Home / Self Care | Payer: Medicaid Other | Source: Ambulatory Visit | Attending: Family Medicine | Admitting: Family Medicine

## 2013-12-20 ENCOUNTER — Encounter (HOSPITAL_COMMUNITY): Payer: Self-pay | Admitting: Advanced Practice Midwife

## 2013-12-20 DIAGNOSIS — Z3201 Encounter for pregnancy test, result positive: Secondary | ICD-10-CM

## 2013-12-20 DIAGNOSIS — Z3200 Encounter for pregnancy test, result unknown: Secondary | ICD-10-CM | POA: Diagnosis present

## 2013-12-20 DIAGNOSIS — M542 Cervicalgia: Secondary | ICD-10-CM | POA: Diagnosis not present

## 2013-12-20 DIAGNOSIS — G8929 Other chronic pain: Secondary | ICD-10-CM | POA: Diagnosis not present

## 2013-12-20 LAB — URINALYSIS, ROUTINE W REFLEX MICROSCOPIC
Bilirubin Urine: NEGATIVE
GLUCOSE, UA: NEGATIVE mg/dL
Hgb urine dipstick: NEGATIVE
KETONES UR: NEGATIVE mg/dL
Leukocytes, UA: NEGATIVE
Nitrite: NEGATIVE
PROTEIN: NEGATIVE mg/dL
Specific Gravity, Urine: 1.025 (ref 1.005–1.030)
Urobilinogen, UA: 0.2 mg/dL (ref 0.0–1.0)
pH: 6 (ref 5.0–8.0)

## 2013-12-20 LAB — POCT PREGNANCY, URINE: Preg Test, Ur: POSITIVE — AB

## 2013-12-20 MED ORDER — CYCLOBENZAPRINE HCL 10 MG PO TABS: 5.0000 mg | ORAL_TABLET | Freq: Three times a day (TID) | ORAL | Status: DC | PRN

## 2013-12-20 NOTE — MAU Provider Note (Signed)
S: 28 y.o. G2P1001  by LMP presents to MAU for pregnancy test.  She denies pain, vaginal bleeding, n/v, or fever/chills.  This is a desired pregnancy.  She does report chronic neck pain treated with muscle relaxers and asks if these are safe in pregnancy.  O: BP 125/81  Pulse 96  Temp(Src) 98.6 F (37 C) (Oral)  Resp 18  Ht  (1.651 m)  Wt 195 lb (88.451 kg)  BMI 32.45 kg/m2  LMP 12/03/2013  Physical Examination: General appearance - alert, well appearing, and in no distress, oriented to person, place, and time and acyanotic, in no respiratory distress  A: Positive pregnancy test Chronic neck pain  P: D/C home Outpatient U/s ordered for dating Pt plans to begin care at [facility] Rx for Flexeril 5-10 mg TID PRN sent to pharmacy List of safe OTC medications given Return to MAU as needed for emergencies  Sharen Counter Certified Nurse-Midwife

## 2013-12-20 NOTE — MAU Note (Signed)
Have been trying to get preg.  +HPT yesterday and today.  Wants to know how far along she is.  Has an appt in Oct at [facility].

## 2013-12-20 NOTE — MAU Note (Deleted)
throwing up bloody green fluid, having abd cramping. Started 3 days ago.  Is getting worse.

## 2013-12-22 NOTE — MAU Provider Note (Signed)
Attestation of Attending Supervision of Advanced Practitioner (PA/CNM/NP): Evaluation and management procedures were performed by the Advanced Practitioner under my supervision and collaboration.  I have reviewed the Advanced Practitioner's note and chart, and I agree with the management and plan.  Dory Verdun, DO Attending Physician Faculty Practice, [facility]  

## 2014-01-03 ENCOUNTER — Other Ambulatory Visit (HOSPITAL_COMMUNITY): Payer: Self-pay | Admitting: Advanced Practice Midwife

## 2014-01-03 ENCOUNTER — Ambulatory Visit (HOSPITAL_COMMUNITY)
Admission: RE | Admit: 2014-01-03 | Discharge: 2014-01-03 | Disposition: A | Discharge status: Home / Self Care | Payer: Medicaid Other | Source: Ambulatory Visit | Attending: Advanced Practice Midwife | Admitting: Advanced Practice Midwife

## 2014-01-03 DIAGNOSIS — O9989 Other specified diseases and conditions complicating pregnancy, childbirth and the puerperium: Principal | ICD-10-CM

## 2014-01-03 DIAGNOSIS — N831 Corpus luteum cyst of ovary, unspecified side: Secondary | ICD-10-CM | POA: Insufficient documentation

## 2014-01-03 DIAGNOSIS — O34599 Maternal care for other abnormalities of gravid uterus, unspecified trimester: Secondary | ICD-10-CM | POA: Insufficient documentation

## 2014-01-03 DIAGNOSIS — Z3201 Encounter for pregnancy test, result positive: Secondary | ICD-10-CM

## 2014-01-03 DIAGNOSIS — O99891 Other specified diseases and conditions complicating pregnancy: Secondary | ICD-10-CM | POA: Diagnosis present

## 2014-01-16 ENCOUNTER — Encounter: Payer: Self-pay | Admitting: Family Medicine

## 2014-01-16 ENCOUNTER — Other Ambulatory Visit (HOSPITAL_COMMUNITY)
Admission: RE | Admit: 2014-01-16 | Discharge: 2014-01-16 | Disposition: A | Discharge status: Home / Self Care | Payer: Medicaid Other | Source: Ambulatory Visit | Attending: Family Medicine | Admitting: Family Medicine

## 2014-01-16 ENCOUNTER — Ambulatory Visit (INDEPENDENT_AMBULATORY_CARE_PROVIDER_SITE_OTHER): Payer: Medicaid Other | Admitting: Family Medicine

## 2014-01-16 VITALS — BP 106/77 | HR 86 | Wt 200.6 lb

## 2014-01-16 DIAGNOSIS — Z113 Encounter for screening for infections with a predominantly sexual mode of transmission: Secondary | ICD-10-CM | POA: Insufficient documentation

## 2014-01-16 DIAGNOSIS — Z3480 Encounter for supervision of other normal pregnancy, unspecified trimester: Secondary | ICD-10-CM | POA: Insufficient documentation

## 2014-01-16 DIAGNOSIS — Z3491 Encounter for supervision of normal pregnancy, unspecified, first trimester: Secondary | ICD-10-CM

## 2014-01-16 DIAGNOSIS — Z36 Encounter for antenatal screening of mother: Secondary | ICD-10-CM

## 2014-01-16 DIAGNOSIS — Z3490 Encounter for supervision of normal pregnancy, unspecified, unspecified trimester: Secondary | ICD-10-CM

## 2014-01-16 NOTE — Progress Notes (Signed)
Bedside ultrasound shows FHR present and CRL of 7 wk 0 days. This differs by 5 days from LMP dating.

## 2014-01-16 NOTE — Progress Notes (Signed)
   Subjective:    Barbara Waller is a G2P1001 7245w2d being seen today for her first obstetrical visit.  Her obstetrical history is not significant. Pregnancy history fully reviewed.  Patient reports no complaints.  Filed Vitals:   01/16/14 1548  BP: 106/77  Pulse: 86  Weight: 200 lb 9.6 oz (90.992 kg)    HISTORY: OB History  Gravida Para Term Preterm AB SAB TAB Ectopic Multiple Living  2 1 1       1     # Outcome Date GA Lbr Len/2nd Weight Sex Delivery Anes PTL Lv  2 CUR           1 TRM 02/16/09 305w0d  6 lb 14 oz (3.118 kg) F    Y     Past Medical History  Diagnosis Date  . 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 Obesity    Past Surgical History  Procedure Laterality Date  . Colposcopy    . Colostomy    . Axilla abscess     Family History  Problem Relation Age of Onset  . Diabetes Mother   . Hypertension Mother   . Diabetes Maternal Grandmother   . Anesthesia problems Neg Hx      Exam    Uterus:     Pelvic Exam:    Perineum: Normal Perineum   Vulva: Bartholin's, Urethra, Skene's normal   Vagina:  normal mucosa, normal discharge   Cervix: multiparous appearance and no cervical motion tenderness   Adnexa: normal adnexa   Bony Pelvis: average  System: Breast:  normal appearance, no masses or tenderness   Skin: normal coloration and turgor, no rashes    Neurologic: oriented   Extremities: normal strength, tone, and muscle mass   HEENT sclera clear, anicteric   Mouth/Teeth mucous membranes moist, pharynx normal without lesions and dental hygiene good   Neck supple   Cardiovascular: regular rate and rhythm, no murmurs or gallops   Respiratory:  appears well, vitals normal, no respiratory distress, acyanotic, normal RR, ear and throat exam is normal, neck free of mass or lymphadenopathy, chest clear, no wheezing, crepitations, rhonchi, normal symmetric air entry   Abdomen: soft, non-tender; bowel sounds  normal; no masses,  no organomegaly      Assessment:    Pregnancy: G2P1001 Patient Active Problem List   Diagnosis Date Noted  . Supervision of normal pregnancy 01/16/2014        Plan:     Initial labs drawn. Prenatal vitamins. Problem list reviewed and updated. Genetic Screening discussed First Screen: declined. Ultrasound discussed; fetal survey: discussed. Follow up in 4 weeks. Declines flu shot.  Caio Devera S 01/16/2014

## 2014-01-16 NOTE — Addendum Note (Signed)
Addended by: Barbara CowerNOGUES, Lazara Grieser L on: 01/16/2014 04:39 PM   Modules accepted: Orders

## 2014-01-16 NOTE — Patient Instructions (Signed)
First Trimester of Pregnancy The first trimester of pregnancy is from week 1 until the end of week 12 (months 1 through 3). A week after a sperm fertilizes an egg, the egg will implant on the wall of the uterus. This embryo will begin to develop into a baby. Genes from you and your partner are forming the baby. The female genes determine whether the baby is a boy or a girl. At 6-8 weeks, the eyes and face are formed, and the heartbeat can be seen on ultrasound. At the end of 12 weeks, all the baby's organs are formed.  Now that you are pregnant, you will want to do everything you can to have a healthy baby. Two of the most important things are to get good prenatal care and to follow your health care provider's instructions. Prenatal care is all the medical care you receive before the baby's birth. This care will help prevent, find, and treat any problems during the pregnancy and childbirth. BODY CHANGES Your body goes through many changes during pregnancy. The changes vary from woman to woman.   You may gain or lose a couple of pounds at first.  You may feel sick to your stomach (nauseous) and throw up (vomit). If the vomiting is uncontrollable, call your health care provider.  You may tire easily.  You may develop headaches that can be relieved by medicines approved by your health care provider.  You may urinate more often. Painful urination may mean you have a bladder infection.  You may develop heartburn as a result of your pregnancy.  You may develop constipation because certain hormones are causing the muscles that push waste through your intestines to slow down.  You may develop hemorrhoids or swollen, bulging veins (varicose veins).  Your breasts may begin to grow larger and become tender. Your nipples may stick out more, and the tissue that surrounds them (areola) may become darker.  Your gums may bleed and may be sensitive to brushing and flossing.  Dark spots or blotches  (chloasma, mask of pregnancy) may develop on your face. This will likely fade after the baby is born.  Your menstrual periods will stop.  You may have a loss of appetite.  You may develop cravings for certain kinds of food.  You may have changes in your emotions from day to day, such as being excited to be pregnant or being concerned that something may go wrong with the pregnancy and baby.  You may have more vivid and strange dreams.  You may have changes in your hair. These can include thickening of your hair, rapid growth, and changes in texture. Some women also have hair loss during or after pregnancy, or hair that feels dry or thin. Your hair will most likely return to normal after your baby is born. WHAT TO EXPECT AT YOUR PRENATAL VISITS During a routine prenatal visit:  You will be weighed to make sure you and the baby are growing normally.  Your blood pressure will be taken.  Your abdomen will be measured to track your baby's growth.  The fetal heartbeat will be listened to starting around week 10 or 12 of your pregnancy.  Test results from any previous visits will be discussed. Your health care provider may ask you:  How you are feeling.  If you are feeling the baby move.  If you have had any abnormal symptoms, such as leaking fluid, bleeding, severe headaches, or abdominal cramping.  If you have any questions. Other tests   that may be performed during your first trimester include:  Blood tests to find your blood type and to check for the presence of any previous infections. They will also be used to check for low iron levels (anemia) and Rh antibodies. Later in the pregnancy, blood tests for diabetes will be done along with other tests if problems develop.  Urine tests to check for infections, diabetes, or protein in the urine.  An ultrasound to confirm the proper growth and development of the baby.  An amniocentesis to check for possible genetic problems.  Fetal  screens for spina bifida and Down syndrome.  You may need other tests to make sure you and the baby are doing well. HOME CARE INSTRUCTIONS  Medicines  Follow your health care provider's instructions regarding medicine use. Specific medicines may be either safe or unsafe to take during pregnancy.  Take your prenatal vitamins as directed.  If you develop constipation, try taking a stool softener if your health care provider approves. Diet  Eat regular, well-balanced meals. Choose a variety of foods, such as meat or vegetable-based protein, fish, milk and low-fat dairy products, vegetables, fruits, and whole grain breads and cereals. Your health care provider will help you determine the amount of weight gain that is right for you.  Avoid raw meat and uncooked cheese. These carry germs that can cause birth defects in the baby.  Eating four or five small meals rather than three large meals a day may help relieve nausea and vomiting. If you start to feel nauseous, eating a few soda crackers can be helpful. Drinking liquids between meals instead of during meals also seems to help nausea and vomiting.  If you develop constipation, eat more high-fiber foods, such as fresh vegetables or fruit and whole grains. Drink enough fluids to keep your urine clear or pale yellow. Activity and Exercise  Exercise only as directed by your health care provider. Exercising will help you:  Control your weight.  Stay in shape.  Be prepared for labor and delivery.  Experiencing pain or cramping in the lower abdomen or low back is a good sign that you should stop exercising. Check with your health care provider before continuing normal exercises.  Try to avoid standing for long periods of time. Move your legs often if you must stand in one place for a long time.  Avoid heavy lifting.  Wear low-heeled shoes, and practice good posture.  You may continue to have sex unless your health care provider directs you  otherwise. Relief of Pain or Discomfort  Wear a good support bra for breast tenderness.   Take warm sitz baths to soothe any pain or discomfort caused by hemorrhoids. Use hemorrhoid cream if your health care provider approves.   Rest with your legs elevated if you have leg cramps or low back pain.  If you develop varicose veins in your legs, wear support hose. Elevate your feet for 15 minutes, 3-4 times a day. Limit salt in your diet. Prenatal Care  Schedule your prenatal visits by the twelfth week of pregnancy. They are usually scheduled monthly at first, then more often in the last 2 months before delivery.  Write down your questions. Take them to your prenatal visits.  Keep all your prenatal visits as directed by your health care provider. Safety  Wear your seat belt at all times when driving.  Make a list of emergency phone numbers, including numbers for family, friends, the hospital, and police and fire departments. General Tips    Ask your health care provider for a referral to a local prenatal education class. Begin classes no later than at the beginning of month 6 of your pregnancy.  Ask for help if you have counseling or nutritional needs during pregnancy. Your health care provider can offer advice or refer you to specialists for help with various needs.  Do not use hot tubs, steam rooms, or saunas.  Do not douche or use tampons or scented sanitary pads.  Do not cross your legs for long periods of time.  Avoid cat litter boxes and soil used by cats. These carry germs that can cause birth defects in the baby and possibly loss of the fetus by miscarriage or stillbirth.  Avoid all smoking, herbs, alcohol, and medicines not prescribed by your health care provider. Chemicals in these affect the formation and growth of the baby.  Schedule a dentist appointment. At home, brush your teeth with a soft toothbrush and be gentle when you floss. SEEK MEDICAL CARE IF:   You have  dizziness.  You have mild pelvic cramps, pelvic pressure, or nagging pain in the abdominal area.  You have persistent nausea, vomiting, or diarrhea.  You have a bad smelling vaginal discharge.  You have pain with urination.  You notice increased swelling in your face, hands, legs, or ankles. SEEK IMMEDIATE MEDICAL CARE IF:   You have a fever.  You are leaking fluid from your vagina.  You have spotting or bleeding from your vagina.  You have severe abdominal cramping or pain.  You have rapid weight gain or loss.  You vomit blood or material that looks like coffee grounds.  You are exposed to German measles and have never had them.  You are exposed to fifth disease or chickenpox.  You develop a severe headache.  You have shortness of breath.  You have any kind of trauma, such as from a fall or a car accident. Document Released: 03/25/2001 Document Revised: 08/15/2013 Document Reviewed: 02/08/2013 [employer] Patient Information 2015 [employer]. This information is not intended to replace advice given to you by your health care provider. Make sure you discuss any questions you have with your health care provider.  Breastfeeding Deciding to breastfeed is one of the best choices you can make for you and your baby. A change in hormones during pregnancy causes your breast tissue to grow and increases the number and size of your milk ducts. These hormones also allow proteins, sugars, and fats from your blood supply to make breast milk in your milk-producing glands. Hormones prevent breast milk from being released before your baby is born as well as prompt milk flow after birth. Once breastfeeding has begun, thoughts of your baby, as well as his or her sucking or crying, can stimulate the release of milk from your milk-producing glands.  BENEFITS OF BREASTFEEDING For Your Baby  Your first milk (colostrum) helps your baby's digestive system function better.   There are antibodies  in your milk that help your baby fight off infections.   Your baby has a lower incidence of asthma, allergies, and sudden infant death syndrome.   The nutrients in breast milk are better for your baby than infant formulas and are designed uniquely for your baby's needs.   Breast milk improves your baby's brain development.   Your baby is less likely to develop other conditions, such as childhood obesity, asthma, or type 2 diabetes mellitus.  For You   Breastfeeding helps to create a very special bond between   you and your baby.   Breastfeeding is convenient. Breast milk is always available at the correct temperature and costs nothing.   Breastfeeding helps to burn calories and helps you lose the weight gained during pregnancy.   Breastfeeding makes your uterus contract to its prepregnancy size faster and slows bleeding (lochia) after you give birth.   Breastfeeding helps to lower your risk of developing type 2 diabetes mellitus, osteoporosis, and breast or ovarian cancer later in life. SIGNS THAT YOUR BABY IS HUNGRY Early Signs of Hunger  Increased alertness or activity.  Stretching.  Movement of the head from side to side.  Movement of the head and opening of the mouth when the corner of the mouth or cheek is stroked (rooting).  Increased sucking sounds, smacking lips, cooing, sighing, or squeaking.  Hand-to-mouth movements.  Increased sucking of fingers or hands. Late Signs of Hunger  Fussing.  Intermittent crying. Extreme Signs of Hunger Signs of extreme hunger will require calming and consoling before your baby will be able to breastfeed successfully. Do not wait for the following signs of extreme hunger to occur before you initiate breastfeeding:   Restlessness.  A loud, strong cry.   Screaming. BREASTFEEDING BASICS Breastfeeding Initiation  Find a comfortable place to sit or lie down, with your neck and back well supported.  Place a pillow or  rolled up blanket under your baby to bring him or her to the level of your breast (if you are seated). Nursing pillows are specially designed to help support your arms and your baby while you breastfeed.  Make sure that your baby's abdomen is facing your abdomen.   Gently massage your breast. With your fingertips, massage from your chest wall toward your nipple in a circular motion. This encourages milk flow. You may need to continue this action during the feeding if your milk flows slowly.  Support your breast with 4 fingers underneath and your thumb above your nipple. Make sure your fingers are well away from your nipple and your baby's mouth.   Stroke your baby's lips gently with your finger or nipple.   When your baby's mouth is open wide enough, quickly bring your baby to your breast, placing your entire nipple and as much of the colored area around your nipple (areola) as possible into your baby's mouth.   More areola should be visible above your baby's upper lip than below the lower lip.   Your baby's tongue should be between his or her lower gum and your breast.   Ensure that your baby's mouth is correctly positioned around your nipple (latched). Your baby's lips should create a seal on your breast and be turned out (everted).  It is common for your baby to suck about 2-3 minutes in order to start the flow of breast milk. Latching Teaching your baby how to latch on to your breast properly is very important. An improper latch can cause nipple pain and decreased milk supply for you and poor weight gain in your baby. Also, if your baby is not latched onto your nipple properly, he or she may swallow some air during feeding. This can make your baby fussy. Burping your baby when you switch breasts during the feeding can help to get rid of the air. However, teaching your baby to latch on properly is still the best way to prevent fussiness from swallowing air while breastfeeding. Signs  that your baby has successfully latched on to your nipple:    Silent tugging or silent   sucking, without causing you pain.   Swallowing heard between every 3-4 sucks.    Muscle movement above and in front of his or her ears while sucking.  Signs that your baby has not successfully latched on to nipple:   Sucking sounds or smacking sounds from your baby while breastfeeding.  Nipple pain. If you think your baby has not latched on correctly, slip your finger into the corner of your baby's mouth to break the suction and place it between your baby's gums. Attempt breastfeeding initiation again. Signs of Successful Breastfeeding Signs from your baby:   A gradual decrease in the number of sucks or complete cessation of sucking.   Falling asleep.   Relaxation of his or her body.   Retention of a small amount of milk in his or her mouth.   Letting go of your breast by himself or herself. Signs from you:  Breasts that have increased in firmness, weight, and size 1-3 hours after feeding.   Breasts that are softer immediately after breastfeeding.  Increased milk volume, as well as a change in milk consistency and color by the fifth day of breastfeeding.   Nipples that are not sore, cracked, or bleeding. Signs That Your Baby is Getting Enough Milk  Wetting at least 3 diapers in a 24-hour period. The urine should be clear and pale yellow by age 5 days.  At least 3 stools in a 24-hour period by age 5 days. The stool should be soft and yellow.  At least 3 stools in a 24-hour period by age 7 days. The stool should be seedy and yellow.  No loss of weight greater than 10% of birth weight during the first 3 days of age.  Average weight gain of 4-7 ounces (113-198 g) per week after age 4 days.  Consistent daily weight gain by age 5 days, without weight loss after the age of 2 weeks. After a feeding, your baby may spit up a small amount. This is common. BREASTFEEDING FREQUENCY AND  DURATION Frequent feeding will help you make more milk and can prevent sore nipples and breast engorgement. Breastfeed when you feel the need to reduce the fullness of your breasts or when your baby shows signs of hunger. This is called "breastfeeding on demand." Avoid introducing a pacifier to your baby while you are working to establish breastfeeding (the first 4-6 weeks after your baby is born). After this time you may choose to use a pacifier. Research has shown that pacifier use during the first year of a baby's life decreases the risk of sudden infant death syndrome (SIDS). Allow your baby to feed on each breast as long as he or she wants. Breastfeed until your baby is finished feeding. When your baby unlatches or falls asleep while feeding from the first breast, offer the second breast. Because newborns are often sleepy in the first few weeks of life, you may need to awaken your baby to get him or her to feed. Breastfeeding times will vary from baby to baby. However, the following rules can serve as a guide to help you ensure that your baby is properly fed:  Newborns (babies 4 weeks of age or younger) may breastfeed every 1-3 hours.  Newborns should not go longer than 3 hours during the day or 5 hours during the night without breastfeeding.  You should breastfeed your baby a minimum of 8 times in a 24-hour period until you begin to introduce solid foods to your baby at around 6   months of age. BREAST MILK PUMPING Pumping and storing breast milk allows you to ensure that your baby is exclusively fed your breast milk, even at times when you are unable to breastfeed. This is especially important if you are going back to work while you are still breastfeeding or when you are not able to be present during feedings. Your lactation consultant can give you guidelines on how long it is safe to store breast milk.  A breast pump is a machine that allows you to pump milk from your breast into a sterile bottle.  The pumped breast milk can then be stored in a refrigerator or freezer. Some breast pumps are operated by hand, while others use electricity. Ask your lactation consultant which type will work best for you. Breast pumps can be purchased, but some hospitals and breastfeeding support groups lease breast pumps on a monthly basis. A lactation consultant can teach you how to hand express breast milk, if you prefer not to use a pump.  CARING FOR YOUR BREASTS WHILE YOU BREASTFEED Nipples can become dry, cracked, and sore while breastfeeding. The following recommendations can help keep your breasts moisturized and healthy:  Avoid using soap on your nipples.   Wear a supportive bra. Although not required, special nursing bras and tank tops are designed to allow access to your breasts for breastfeeding without taking off your entire bra or top. Avoid wearing underwire-style bras or extremely tight bras.  Air dry your nipples for 3-4minutes after each feeding.   Use only cotton bra pads to absorb leaked breast milk. Leaking of breast milk between feedings is normal.   Use lanolin on your nipples after breastfeeding. Lanolin helps to maintain your skin's normal moisture barrier. If you use pure lanolin, you do not need to wash it off before feeding your baby again. Pure lanolin is not toxic to your baby. You may also hand express a few drops of breast milk and gently massage that milk into your nipples and allow the milk to air dry. In the first few weeks after giving birth, some women experience extremely full breasts (engorgement). Engorgement can make your breasts feel heavy, warm, and tender to the touch. Engorgement peaks within 3-5 days after you give birth. The following recommendations can help ease engorgement:  Completely empty your breasts while breastfeeding or pumping. You may want to start by applying warm, moist heat (in the shower or with warm water-soaked hand towels) just before feeding or  pumping. This increases circulation and helps the milk flow. If your baby does not completely empty your breasts while breastfeeding, pump any extra milk after he or she is finished.  Wear a snug bra (nursing or regular) or tank top for 1-2 days to signal your body to slightly decrease milk production.  Apply ice packs to your breasts, unless this is too uncomfortable for you.  Make sure that your baby is latched on and positioned properly while breastfeeding. If engorgement persists after 48 hours of following these recommendations, contact your health care provider or a lactation consultant. OVERALL HEALTH CARE RECOMMENDATIONS WHILE BREASTFEEDING  Eat healthy foods. Alternate between meals and snacks, eating 3 of each per day. Because what you eat affects your breast milk, some of the foods may make your baby more irritable than usual. Avoid eating these foods if you are sure that they are negatively affecting your baby.  Drink milk, fruit juice, and water to satisfy your thirst (about 10 glasses a day).   Rest   often, relax, and continue to take your prenatal vitamins to prevent fatigue, stress, and anemia.  Continue breast self-awareness checks.  Avoid chewing and smoking tobacco.  Avoid alcohol and drug use. Some medicines that may be harmful to your baby can pass through breast milk. It is important to ask your health care provider before taking any medicine, including all over-the-counter and prescription medicine as well as vitamin and herbal supplements. It is possible to become pregnant while breastfeeding. If birth control is desired, ask your health care provider about options that will be safe for your baby. SEEK MEDICAL CARE IF:   You feel like you want to stop breastfeeding or have become frustrated with breastfeeding.  You have painful breasts or nipples.  Your nipples are cracked or bleeding.  Your breasts are red, tender, or warm.  You have a swollen area on either  breast.  You have a fever or chills.  You have nausea or vomiting.  You have drainage other than breast milk from your nipples.  Your breasts do not become full before feedings by the fifth day after you give birth.  You feel sad and depressed.  Your baby is too sleepy to eat well.  Your baby is having trouble sleeping.   Your baby is wetting less than 3 diapers in a 24-hour period.  Your baby has less than 3 stools in a 24-hour period.  Your baby's skin or the white part of his or her eyes becomes yellow.   Your baby is not gaining weight by 5 days of age. SEEK IMMEDIATE MEDICAL CARE IF:   Your baby is overly tired (lethargic) and does not want to wake up and feed.  Your baby develops an unexplained fever. Document Released: 03/31/2005 Document Revised: 04/05/2013 Document Reviewed: 09/22/2012 [employer] Patient Information 2015 [employer]. This information is not intended to replace advice given to you by your health care provider. Make sure you discuss any questions you have with your health care provider.  

## 2014-01-17 LAB — OBSTETRIC PANEL
Antibody Screen: NEGATIVE
Basophils Absolute: 0 10*3/uL (ref 0.0–0.1)
Basophils Relative: 0 % (ref 0–1)
EOS ABS: 0.1 10*3/uL (ref 0.0–0.7)
Eosinophils Relative: 2 % (ref 0–5)
HCT: 34.2 % — ABNORMAL LOW (ref 36.0–46.0)
HEP B S AG: NEGATIVE
Hemoglobin: 11.1 g/dL — ABNORMAL LOW (ref 12.0–15.0)
LYMPHS ABS: 2.1 10*3/uL (ref 0.7–4.0)
Lymphocytes Relative: 32 % (ref 12–46)
MCH: 25.5 pg — ABNORMAL LOW (ref 26.0–34.0)
MCHC: 32.5 g/dL (ref 30.0–36.0)
MCV: 78.4 fL (ref 78.0–100.0)
Monocytes Absolute: 0.5 10*3/uL (ref 0.1–1.0)
Monocytes Relative: 8 % (ref 3–12)
NEUTROS PCT: 58 % (ref 43–77)
Neutro Abs: 3.9 10*3/uL (ref 1.7–7.7)
PLATELETS: 373 10*3/uL (ref 150–400)
RBC: 4.36 MIL/uL (ref 3.87–5.11)
RDW: 15.1 % (ref 11.5–15.5)
Rh Type: POSITIVE
Rubella: 1.23 Index — ABNORMAL HIGH (ref <0.90)
WBC: 6.7 10*3/uL (ref 4.0–10.5)

## 2014-01-17 LAB — HIV ANTIBODY (ROUTINE TESTING W REFLEX): HIV 1&2 Ab, 4th Generation: NONREACTIVE

## 2014-01-18 LAB — HEMOGLOBINOPATHY EVALUATION
HGB A2 QUANT: 2.5 % (ref 2.2–3.2)
HGB A: 97.5 % (ref 96.8–97.8)
Hemoglobin Other: 0 %
Hgb F Quant: 0 % (ref 0.0–2.0)
Hgb S Quant: 0 %

## 2014-01-18 LAB — CULTURE, OB URINE
COLONY COUNT: NO GROWTH
ORGANISM ID, BACTERIA: NO GROWTH

## 2014-01-18 LAB — CYTOLOGY - PAP

## 2014-01-19 ENCOUNTER — Encounter: Payer: Self-pay | Admitting: Family Medicine

## 2014-02-13 ENCOUNTER — Encounter: Payer: Self-pay | Admitting: Obstetrics & Gynecology

## 2014-02-13 ENCOUNTER — Ambulatory Visit (INDEPENDENT_AMBULATORY_CARE_PROVIDER_SITE_OTHER): Payer: Medicaid Other | Admitting: Obstetrics & Gynecology

## 2014-02-13 VITALS — BP 104/71 | HR 81 | Wt 205.0 lb

## 2014-02-13 DIAGNOSIS — Z3491 Encounter for supervision of normal pregnancy, unspecified, first trimester: Secondary | ICD-10-CM

## 2014-02-13 NOTE — Progress Notes (Signed)
No problems.  IUP confimed with sono. F/u 4 weeks Reviewed PNL

## 2014-02-13 NOTE — Patient Instructions (Addendum)
First Trimester of Pregnancy The first trimester of pregnancy is from week 1 until the end of week 12 (months 1 through 3). A week after a sperm fertilizes an egg, the egg will implant on the wall of the uterus. This embryo will begin to develop into a baby. Genes from you and your partner are forming the baby. The female genes determine whether the baby is a boy or a girl. At 6-8 weeks, the eyes and face are formed, and the heartbeat can be seen on ultrasound. At the end of 12 weeks, all the baby's organs are formed.  Now that you are pregnant, you will want to do everything you can to have a healthy baby. Two of the most important things are to get good prenatal care and to follow your health care provider's instructions. Prenatal care is all the medical care you receive before the baby's birth. This care will help prevent, find, and treat any problems during the pregnancy and childbirth. BODY CHANGES Your body goes through many changes during pregnancy. The changes vary from woman to woman.   You may gain or lose a couple of pounds at first.  You may feel sick to your stomach (nauseous) and throw up (vomit). If the vomiting is uncontrollable, call your health care provider.  You may tire easily.  You may develop headaches that can be relieved by medicines approved by your health care provider.  You may urinate more often. Painful urination may mean you have a bladder infection.  You may develop heartburn as a result of your pregnancy.  You may develop constipation because certain hormones are causing the muscles that push waste through your intestines to slow down.  You may develop hemorrhoids or swollen, bulging veins (varicose veins).  Your breasts may begin to grow larger and become tender. Your nipples may stick out more, and the tissue that surrounds them (areola) may become darker.  Your gums may bleed and may be sensitive to brushing and flossing.  Dark spots or blotches (chloasma,  mask of pregnancy) may develop on your face. This will likely fade after the baby is born.  Your menstrual periods will stop.  You may have a loss of appetite.  You may develop cravings for certain kinds of food.  You may have changes in your emotions from day to day, such as being excited to be pregnant or being concerned that something may go wrong with the pregnancy and baby.  You may have more vivid and strange dreams.  You may have changes in your hair. These can include thickening of your hair, rapid growth, and changes in texture. Some women also have hair loss during or after pregnancy, or hair that feels dry or thin. Your hair will most likely return to normal after your baby is born. WHAT TO EXPECT AT YOUR PRENATAL VISITS During a routine prenatal visit:  You will be weighed to make sure you and the baby are growing normally.  Your blood pressure will be taken.  Your abdomen will be measured to track your baby's growth.  The fetal heartbeat will be listened to starting around week 10 or 12 of your pregnancy.  Test results from any previous visits will be discussed. Your health care provider may ask you:  How you are feeling.  If you are feeling the baby move.  If you have had any abnormal symptoms, such as leaking fluid, bleeding, severe headaches, or abdominal cramping.  If you have any questions. Other tests   that may be performed during your first trimester include:  Blood tests to find your blood type and to check for the presence of any previous infections. They will also be used to check for low iron levels (anemia) and Rh antibodies. Later in the pregnancy, blood tests for diabetes will be done along with other tests if problems develop.  Urine tests to check for infections, diabetes, or protein in the urine.  An ultrasound to confirm the proper growth and development of the baby.  An amniocentesis to check for possible genetic problems.  Fetal screens for  spina bifida and Down syndrome.  You may need other tests to make sure you and the baby are doing well. HOME CARE INSTRUCTIONS  Medicines  Follow your health care provider's instructions regarding medicine use. Specific medicines may be either safe or unsafe to take during pregnancy.  Take your prenatal vitamins as directed.  If you develop constipation, try taking a stool softener if your health care provider approves. Diet  Eat regular, well-balanced meals. Choose a variety of foods, such as meat or vegetable-based protein, fish, milk and low-fat dairy products, vegetables, fruits, and whole grain breads and cereals. Your health care provider will help you determine the amount of weight gain that is right for you.  Avoid raw meat and uncooked cheese. These carry germs that can cause birth defects in the baby.  Eating four or five small meals rather than three large meals a day may help relieve nausea and vomiting. If you start to feel nauseous, eating a few soda crackers can be helpful. Drinking liquids between meals instead of during meals also seems to help nausea and vomiting.  If you develop constipation, eat more high-fiber foods, such as fresh vegetables or fruit and whole grains. Drink enough fluids to keep your urine clear or pale yellow. Activity and Exercise  Exercise only as directed by your health care provider. Exercising will help you:  Control your weight.  Stay in shape.  Be prepared for labor and delivery.  Experiencing pain or cramping in the lower abdomen or low back is a good sign that you should stop exercising. Check with your health care provider before continuing normal exercises.  Try to avoid standing for long periods of time. Move your legs often if you must stand in one place for a long time.  Avoid heavy lifting.  Wear low-heeled shoes, and practice good posture.  You may continue to have sex unless your health care provider directs you  otherwise. Relief of Pain or Discomfort  Wear a good support bra for breast tenderness.   Take warm sitz baths to soothe any pain or discomfort caused by hemorrhoids. Use hemorrhoid cream if your health care provider approves.   Rest with your legs elevated if you have leg cramps or low back pain.  If you develop varicose veins in your legs, wear support hose. Elevate your feet for 15 minutes, 3-4 times a day. Limit salt in your diet. Prenatal Care  Schedule your prenatal visits by the twelfth week of pregnancy. They are usually scheduled monthly at first, then more often in the last 2 months before delivery.  Write down your questions. Take them to your prenatal visits.  Keep all your prenatal visits as directed by your health care provider. Safety  Wear your seat belt at all times when driving.  Make a list of emergency phone numbers, including numbers for family, friends, the hospital, and police and fire departments. General Tips    Ask your health care provider for a referral to a local prenatal education class. Begin classes no later than at the beginning of month 6 of your pregnancy.  Ask for help if you have counseling or nutritional needs during pregnancy. Your health care provider can offer advice or refer you to specialists for help with various needs.  Do not use hot tubs, steam rooms, or saunas.  Do not douche or use tampons or scented sanitary pads.  Do not cross your legs for long periods of time.  Avoid cat litter boxes and soil used by cats. These carry germs that can cause birth defects in the baby and possibly loss of the fetus by miscarriage or stillbirth.  Avoid all smoking, herbs, alcohol, and medicines not prescribed by your health care provider. Chemicals in these affect the formation and growth of the baby.  Schedule a dentist appointment. At home, brush your teeth with a soft toothbrush and be gentle when you floss. SEEK MEDICAL CARE IF:   You have  dizziness.  You have mild pelvic cramps, pelvic pressure, or nagging pain in the abdominal area.  You have persistent nausea, vomiting, or diarrhea.  You have a bad smelling vaginal discharge.  You have pain with urination.  You notice increased swelling in your face, hands, legs, or ankles. SEEK IMMEDIATE MEDICAL CARE IF:   You have a fever.  You are leaking fluid from your vagina.  You have spotting or bleeding from your vagina.  You have severe abdominal cramping or pain.  You have rapid weight gain or loss.  You vomit blood or material that looks like coffee grounds.  You are exposed to [place] measles and have never had them.  You are exposed to fifth disease or chickenpox.  You develop a severe headache.  You have shortness of breath.  You have any kind of trauma, such as from a fall or a car accident. Document Released: 03/25/2001 Document Revised: 08/15/2013 Document Reviewed: 02/08/2013 [facility] Patient Information 2015 [place]Care, [place]. This information is not intended to replace advice given to you by your health care provider. Make sure you discuss any questions you have with your health care provider. Medicines During Pregnancy During pregnancy, there are medicines that are either safe or unsafe to take. Medicines include prescriptions from your caregiver, over-the-counter medicines, topical creams applied to the skin, and all herbal substances. Medicines are put into either Class A, B, C, or D. Class A and B medicines have been shown to be safe in pregnancy. Class C medicines are also considered to be safe in pregnancy, but these medicines should only be used when necessary. Class D medicines should not be used at all in pregnancy. They can be harmful to a baby.  It is best to take as little medicine as possible while pregnant. However, some medicines are necessary to take for the mother and baby's health. Sometimes, it is more dangerous to stop taking certain  medicines than to stay on them. This is often the case for people with long-term (chronic) conditions such as asthma, diabetes, or high blood pressure (hypertension). If you are pregnant and have a chronic illness, call your caregiver right away. Bring a list of your medicines and their doses to your appointments. If you are planning to become pregnant, schedule a doctor's appointment and discuss your medicines with your caregiver. Lastly, write down the phone number to your pharmacist. They can answer questions regarding a medicine's class and safety. They cannot give advice as  to whether you should or should not be on a medicine.  SAFE AND UNSAFE MEDICINES There is a long list of medicines that are considered safe in pregnancy. Below is a shorter list. For specific medicines, ask your caregiver.  AllergyMedicines Loratadine, cetirizine, and chlorpheniramine are safe to take. Certain nasal steroid sprays are safe. Talk to your caregiver about specific brands that are safe. Analgesics Acetaminophen and acetaminophen with codeine are safe to take. All other nonsteroidal anti-inflammatory drugs (NSAIDS) are not safe. This includes ibuprofen.  Antacids Many over-the-counter antacids are safe to take. Talk to your caregiver about specific brands that are safe. Famotidine, ranitidine, and lansoprazole are safe. Omepresole is considered safe to take in the second trimester. Antibiotic Medicines There are several antibiotics to avoid. These include, but are not limited to, tetracyline, quinolones, and sulfa medications. Talk to your caregiver before taking any antibiotic.  Antihistamines Talk to your caregiver about specific brands that are safe.  Asthma Medicines Most asthma steroid inhalers are safe to take. Talk to your caregiver for specific details. Calcium Calcium supplements are safe to take. Do not take oyster shell calcium.  Cough and Cold Medicines It is safe to take products with  guaifenesin or dextromethorphan. Talk to your caregiver about specific brands that are safe. It is not safe to take products that contain aspirin or ibuprofen. Decongestant Medicines Pseudoephedrine-containing products are safe to take in the second and third trimester.  Depression Medicines Talk about these medicines with your caregiver.  Antidiarrheal Medicines It is safe to take loperamide. Talk to your caregiver about specific brands that are safe. It is not safe to take any antidiarrheal medicine that contains bismuth. Eyedrops Allergy eyedrops should be limited.  Iron It is safe to use certain iron-containing medicines for anemia in pregnancy. They require a prescription.  Antinausea Medicines It is safe to take doxylamine and vitamin B6 as directed. There are other prescription medicines available, if needed.  Sleep aids It is safe to take diphenhydramine and acetaminophen with diphenhydramine.  Steroids Hydrocortisone creams are safe to use as directed. Oral steroids require a prescription. It is not safe to take any hemorrhoid cream with pramoxine or phenylephrine. Stool softener It is safe to take stool softener medicines. Avoid daily or prolonged use of stool softeners. Thyroid Medicine It is important to stay on this thyroid medicine. It needs to be followed by your caregiver.  Vaginal Medicines Your caregiver will prescribe a medicine to you if you have a vaginal infection. Certain antifungal medicines are safe to use if you have a sexually transmitted infection (STI). Talk to your caregiver.  Document Released: 03/31/2005 Document Revised: 06/23/2011 Document Reviewed: 04/01/2011 [facility] Patient Information 2015 [place]ExitCare, [place]LLC. This information is not intended to replace advice given to you by your health care provider. Make sure you discuss any questions you have with your health care provider.

## 2014-03-14 ENCOUNTER — Ambulatory Visit (INDEPENDENT_AMBULATORY_CARE_PROVIDER_SITE_OTHER): Payer: Medicaid Other | Admitting: Obstetrics & Gynecology

## 2014-03-14 VITALS — BP 112/78 | HR 94 | Wt 207.0 lb

## 2014-03-14 DIAGNOSIS — Z3490 Encounter for supervision of normal pregnancy, unspecified, unspecified trimester: Secondary | ICD-10-CM

## 2014-03-14 NOTE — Progress Notes (Signed)
Routine visit. Needs early glucola, at next visit. Discussed rec'd weight gain. Quad screen at next visit.

## 2014-03-14 NOTE — Addendum Note (Signed)
Addended by: Barbara CowerNOGUES, Elisandra Deshmukh L on: 03/14/2014 04:31 PM   Modules accepted: Orders

## 2014-03-25 ENCOUNTER — Inpatient Hospital Stay (HOSPITAL_COMMUNITY)
Admission: AD | Admit: 2014-03-25 | Discharge: 2014-03-26 | Disposition: A | Discharge status: Home / Self Care | Payer: Medicaid Other | Source: Ambulatory Visit | Attending: Family Medicine | Admitting: Family Medicine

## 2014-03-25 ENCOUNTER — Encounter (HOSPITAL_COMMUNITY): Payer: Self-pay | Admitting: *Deleted

## 2014-03-25 DIAGNOSIS — Z3A16 16 weeks gestation of pregnancy: Secondary | ICD-10-CM | POA: Insufficient documentation

## 2014-03-25 DIAGNOSIS — O9989 Other specified diseases and conditions complicating pregnancy, childbirth and the puerperium: Secondary | ICD-10-CM | POA: Insufficient documentation

## 2014-03-25 DIAGNOSIS — R109 Unspecified abdominal pain: Secondary | ICD-10-CM | POA: Insufficient documentation

## 2014-03-25 DIAGNOSIS — M791 Myalgia: Secondary | ICD-10-CM | POA: Insufficient documentation

## 2014-03-25 DIAGNOSIS — M7918 Myalgia, other site: Secondary | ICD-10-CM

## 2014-03-25 DIAGNOSIS — O26899 Other specified pregnancy related conditions, unspecified trimester: Secondary | ICD-10-CM

## 2014-03-25 LAB — URINALYSIS, ROUTINE W REFLEX MICROSCOPIC
BILIRUBIN URINE: NEGATIVE
GLUCOSE, UA: NEGATIVE mg/dL
HGB URINE DIPSTICK: NEGATIVE
KETONES UR: 15 mg/dL — AB
Leukocytes, UA: NEGATIVE
Nitrite: NEGATIVE
PH: 6 (ref 5.0–8.0)
Protein, ur: NEGATIVE mg/dL
Specific Gravity, Urine: 1.025 (ref 1.005–1.030)
Urobilinogen, UA: 0.2 mg/dL (ref 0.0–1.0)

## 2014-03-25 NOTE — MAU Note (Signed)
Pt reports pain in right side for the last 2 days. Denies bleeding.

## 2014-03-26 DIAGNOSIS — O9989 Other specified diseases and conditions complicating pregnancy, childbirth and the puerperium: Secondary | ICD-10-CM

## 2014-03-26 DIAGNOSIS — R109 Unspecified abdominal pain: Secondary | ICD-10-CM

## 2014-03-26 LAB — CBC
HCT: 30.7 % — ABNORMAL LOW (ref 36.0–46.0)
HEMOGLOBIN: 10.1 g/dL — AB (ref 12.0–15.0)
MCH: 26 pg (ref 26.0–34.0)
MCHC: 32.9 g/dL (ref 30.0–36.0)
MCV: 79.1 fL (ref 78.0–100.0)
Platelets: 270 10*3/uL (ref 150–400)
RBC: 3.88 MIL/uL (ref 3.87–5.11)
RDW: 13.8 % (ref 11.5–15.5)
WBC: 7.9 10*3/uL (ref 4.0–10.5)

## 2014-03-26 MED ORDER — CYCLOBENZAPRINE HCL 10 MG PO TABS: 5.0000 mg | ORAL_TABLET | Freq: Three times a day (TID) | ORAL | Status: DC | PRN

## 2014-03-26 MED ORDER — CYCLOBENZAPRINE HCL 10 MG PO TABS
10.0000 mg | ORAL_TABLET | Freq: Once | ORAL | Status: AC
  Administered 2014-03-26: 10 mg via ORAL
  Filled 2014-03-26: qty 1

## 2014-03-26 NOTE — Discharge Instructions (Signed)
Musculoskeletal Pain °Musculoskeletal pain is muscle and boney aches and pains. These pains can occur in any part of the body. Your caregiver may treat you without knowing the cause of the pain. They may treat you if blood or urine tests, X-rays, and other tests were normal.  °CAUSES °There is often not a definite cause or reason for these pains. These pains may be caused by a type of germ (virus). The discomfort may also come from overuse. Overuse includes working out too hard when your body is not fit. Boney aches also come from weather changes. Bone is sensitive to atmospheric pressure changes. °HOME CARE INSTRUCTIONS  °· Ask when your test results will be ready. Make sure you get your test results. °· Only take over-the-counter or prescription medicines for pain, discomfort, or fever as directed by your caregiver. If you were given medications for your condition, do not drive, operate machinery or power tools, or sign legal documents for 24 hours. Do not drink alcohol. Do not take sleeping pills or other medications that may interfere with treatment. °· Continue all activities unless the activities cause more pain. When the pain lessens, slowly resume normal activities. Gradually increase the intensity and duration of the activities or exercise. °· During periods of severe pain, bed rest may be helpful. Lay or sit in any position that is comfortable. °· Putting ice on the injured area. °· Put ice in a bag. °· Place a towel between your skin and the bag. °· Leave the ice on for 15 to 20 minutes, 3 to 4 times a day. °· Follow up with your caregiver for continued problems and no reason can be found for the pain. If the pain becomes worse or does not go away, it may be necessary to repeat tests or do additional testing. Your caregiver may need to look further for a possible cause. °SEEK IMMEDIATE MEDICAL CARE IF: °· You have pain that is getting worse and is not relieved by medications. °· You develop chest pain  that is associated with shortness or breath, sweating, feeling sick to your stomach (nauseous), or throw up (vomit). °· Your pain becomes localized to the abdomen. °· You develop any new symptoms that seem different or that concern you. °MAKE SURE YOU:  °· Understand these instructions. °· Will watch your condition. °· Will get help right away if you are not doing well or get worse. °Document Released: 03/31/2005 Document Revised: 06/23/2011 Document Reviewed: 12/03/2012 °ExitCare® Patient Information ©2015 Exit[employer], LLC. This information is not intended to replace advice given to you by your health care provider. Make sure you discuss any questions you have with your health care provider. ° °Abdominal Pain During Pregnancy °Abdominal pain is common in pregnancy. Most of the time, it does not cause harm. There are many causes of abdominal pain. Some causes are more serious than others. Some of the causes of abdominal pain in pregnancy are easily diagnosed. Occasionally, the diagnosis takes time to understand. Other times, the cause is not determined. Abdominal pain can be a sign that something is very wrong with the pregnancy, or the pain may have nothing to do with the pregnancy at all. For this reason, always tell your health care provider if you have any abdominal discomfort. °HOME CARE INSTRUCTIONS  °Monitor your abdominal pain for any changes. The following actions may help to alleviate any discomfort you are experiencing: °· Do not have sexual intercourse or put anything in your vagina until your symptoms go away completely. °·   Get plenty of rest until your pain improves. °· Drink clear fluids if you feel nauseous. Avoid solid food as long as you are uncomfortable or nauseous. °· Only take over-the-counter or prescription medicine as directed by your health care provider. °· Keep all follow-up appointments with your health care provider. °SEEK IMMEDIATE MEDICAL CARE IF: °· You are bleeding, leaking fluid, or  passing tissue from the vagina. °· You have increasing pain or cramping. °· You have persistent vomiting. °· You have painful or bloody urination. °· You have a fever. °· You notice a decrease in your baby's movements. °· You have extreme weakness or feel faint. °· You have shortness of breath, with or without abdominal pain. °· You develop a severe headache with abdominal pain. °· You have abnormal vaginal discharge with abdominal pain. °· You have persistent diarrhea. °· You have abdominal pain that continues even after rest, or gets worse. °MAKE SURE YOU:  °· Understand these instructions. °· Will watch your condition. °· Will get help right away if you are not doing well or get worse. °Document Released: 03/31/2005 Document Revised: 01/19/2013 Document Reviewed: 10/28/2012 °ExitCare® Patient Information ©2015 ExitCare, LLC. This information is not intended to replace advice given to you by your health care provider. Make sure you discuss any questions you have with your health care provider. ° °

## 2014-03-26 NOTE — MAU Provider Note (Signed)
Chief Complaint: Abdominal Pain   First Provider Initiated Contact with Patient 03/26/14 0121      SUBJECTIVE HPI: Barbara Waller is a 28 y.o. G2P1001 at [redacted]w[redacted]d by LMP who presents with sharp pain w/ mvmt to the right of her umbilicus x 2 days. 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 Pain 6/10 at worst. Denies fever, chills, vaginal bleeding, N/V/D/C. Pain improves w/ Tylenol.   Past Medical History  Diagnosis Date  . 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 Obesity    OB History  Gravida Para Term Preterm AB SAB TAB Ectopic Multiple Living  2 1 1       1     # Outcome Date GA Lbr Len/2nd Weight Sex Delivery Anes PTL Lv  2 Current           1 Term 02/16/09 [id number]  3.118 kg (6 lb 14 oz) F    Y     Past Surgical History  Procedure Laterality Date  . Colposcopy    . Colostomy    . Axilla abscess     History   Social History  . Marital Status: Married    Spouse Name: N/A    Number of Children: N/A  . Years of Education: N/A   Occupational History  . Not on file.   Social History Main Topics  . Smoking status: Never Smoker   . Smokeless tobacco: Not on file  . Alcohol Use: No  . Drug Use: No  . Sexual Activity:    Partners: Male   Other Topics Concern  . Not on file   Social History Narrative   No current facility-administered medications on file prior to encounter.   Current Outpatient Prescriptions on File Prior to Encounter  Medication Sig Dispense Refill  . acetaminophen (TYLENOL) 500 MG tablet Take 500 mg by mouth every 6 (six) hours as needed.    . Prenatal Vit-Fe Fumarate-FA (PRENATAL VITAMIN PO) Take by mouth.     No Known Allergies  ROS: Pertinent items in HPI. Denies fever, chills, vaginal bleeding, N/V/D/C, urinary complaints, recent injury.  OBJECTIVE Blood pressure 108/65, pulse 89, temperature 98.7 F (37.1 C), temperature source Oral, resp. rate 18, height 5' 5.5"  (1.664 m), weight 94.802 kg (209 lb), last menstrual period 12/03/2013, SpO2 100 %. GENERAL: Well-developed, obese, well-nourished female in no acute distress.  HEENT: Normocephalic HEART: normal rate RESP: normal effort ABDOMEN: Soft, mod tenderness 3x5 cm area right of umbilicus. No mass. Pos BS x 4. Large deep transverse scar across mid abd and multiple 2x2 cm scars. No tenderness at McBurneys point or where appendix might be displaced in pregnancy. Uterus NT. No CVAT.   EXTREMITIES: Nontender, no edema NEURO: Alert and oriented SPECULUM EXAM: Deferred. FHR 152 by doppler  LAB RESULTS Results for orders placed or performed during the hospital encounter of 03/25/14 (from the past 24 hour(s))  Urinalysis, Routine w reflex microscopic     Status: Abnormal   Collection Time: 03/25/14 11:40 PM  Result Value Ref Range   Color, Urine YELLOW YELLOW   APPearance CLEAR CLEAR   Specific Gravity, Urine 1.025 1.005 - 1.030   pH 6.0 5.0 - 8.0   Glucose, UA NEGATIVE NEGATIVE mg/dL   Hgb urine dipstick NEGATIVE NEGATIVE   Bilirubin Urine NEGATIVE NEGATIVE   Ketones, ur 15 (A) NEGATIVE mg/dL   Protein, ur NEGATIVE NEGATIVE mg/dL   Urobilinogen, UA 0.2  0.0 - 1.0 mg/dL   Nitrite NEGATIVE NEGATIVE   Leukocytes, UA NEGATIVE NEGATIVE  CBC     Status: Abnormal   Collection Time: 03/26/14 12:35 AM  Result Value Ref Range   WBC 7.9 4.0 - 10.5 K/uL   RBC 3.88 3.87 - 5.11 MIL/uL   Hemoglobin 10.1 (L) 12.0 - 15.0 g/dL   HCT 29.530.7 (L) 62.136.0 - 30.846.0 %   MCV 79.1 78.0 - 100.0 fL   MCH 26.0 26.0 - 34.0 pg   MCHC 32.9 30.0 - 36.0 g/dL   RDW 65.713.8 84.611.5 - 96.215.5 %   Platelets 270 150 - 400 K/uL    IMAGING No results found.  MAU COURSE Pain improved significantly w/ Flexeril and warm compress.  Low suspicion for appendicitis due to location of pain and absence of fever, leukocytosis and GI complaints.   ASSESSMENT 1. Abdominal pain affecting pregnancy, antepartum   2. Musculoskeletal pain      PLAN Discharge home in stable condition. Comfort measures.      Follow-up Information    [facility]   Specialty:  Obstetrics and Gynecology   Why:  As scheduled or As needed if symptoms worsen   Contact information:   [address] [place] [id number] [phone]      [facility]   Why:  As needed in emergencies   Contact information:   [address] [id number] [place] [id number] [phone]       Medication List    TAKE these medications        acetaminophen 500 MG tablet  Commonly known as:  TYLENOL  Take 500 mg by mouth every 6 (six) hours as needed.     cyclobenzaprine 10 MG tablet  Commonly known as:  FLEXERIL  Take 0.5-1 tablets (5-10 mg total) by mouth 3 (three) times daily as needed for muscle spasms.     PRENATAL VITAMIN PO  Take by mouth.       Dorathy KinsmanVirginia Seraj Dunnam, CNM 03/26/2014  3:00 AM

## 2014-04-11 ENCOUNTER — Ambulatory Visit (INDEPENDENT_AMBULATORY_CARE_PROVIDER_SITE_OTHER): Payer: Medicaid Other | Admitting: Obstetrics & Gynecology

## 2014-04-11 VITALS — BP 105/77 | HR 96 | Wt 208.0 lb

## 2014-04-11 DIAGNOSIS — Z3492 Encounter for supervision of normal pregnancy, unspecified, second trimester: Secondary | ICD-10-CM

## 2014-04-11 DIAGNOSIS — O99210 Obesity complicating pregnancy, unspecified trimester: Secondary | ICD-10-CM | POA: Insufficient documentation

## 2014-04-11 DIAGNOSIS — Z36 Encounter for antenatal screening of mother: Secondary | ICD-10-CM

## 2014-04-11 NOTE — Progress Notes (Signed)
Has sore throat and cough going on two weeks.  Doing early 1hr GTT today.  Is having some back pain.

## 2014-04-11 NOTE — Progress Notes (Signed)
Glucola and Quad screen today. She has some bilateral rib pain from coughing for the last 2 weeks. Rec OTC cough meds. She has anatomy u/s on 04-18-13. Flu vaccine at next visit. She has a cold today.

## 2014-04-12 LAB — GLUCOSE TOLERANCE, 1 HOUR (50G) W/O FASTING: GLUCOSE 1 HOUR GTT: 109 mg/dL (ref 70–140)

## 2014-04-14 NOTE — L&D Delivery Note (Signed)
Patient is 29 y.o. [id number] admitted for SOL in setting of post-dates.    Delivery Note At 2:02 PM a viable female was delivered via Vaginal, Spontaneous Delivery (Presentation: Right Occiput Anterior).  APGAR: 8, 9; weight  .   Placenta status: Intact, Spontaneous.  Cord: 3 vessels with the following complications: None.   Anesthesia: Epidural  Episiotomy: None Lacerations: None Est. Blood Loss (mL): 186  Mom to postpartum.  Baby to Couplet care / Skin to Skin.   Caryl AdaJazma Phelps, DO 08/31/2014, 3:01 PM PGY-1, [facility]

## 2014-04-17 LAB — AFP, QUAD SCREEN
AFP: 41.8 ng/mL
Age Alone: 1:702 {titer}
CURR GEST AGE: 20.3 wks.days
Down Syndrome Scr Risk Est: 1:1980 {titer}
HCG, Total: 16.19 IU/mL
INH: 99.6 pg/mL
INTERPRETATION-AFP: NEGATIVE
MOM FOR INH: 0.63
MoM for AFP: 0.76
MoM for hCG: 0.95
Open Spina bifida: NEGATIVE
Osb Risk: <1:54600 {titer}
Tri 18 Scr Risk Est: NEGATIVE
Trisomy 18 (Edward) Syndrome Interp.: 1:3970 {titer}
UE3 MOM: 0.56
UE3 VALUE: 1.02 ng/mL

## 2014-04-18 ENCOUNTER — Other Ambulatory Visit: Payer: Self-pay | Admitting: Obstetrics & Gynecology

## 2014-04-18 ENCOUNTER — Ambulatory Visit (HOSPITAL_COMMUNITY)
Admission: RE | Admit: 2014-04-18 | Discharge: 2014-04-18 | Disposition: A | Discharge status: Home / Self Care | Payer: Medicaid Other | Source: Ambulatory Visit | Attending: Obstetrics & Gynecology | Admitting: Obstetrics & Gynecology

## 2014-04-18 DIAGNOSIS — Z3A19 19 weeks gestation of pregnancy: Secondary | ICD-10-CM | POA: Diagnosis not present

## 2014-04-18 DIAGNOSIS — Z3490 Encounter for supervision of normal pregnancy, unspecified, unspecified trimester: Secondary | ICD-10-CM

## 2014-04-18 DIAGNOSIS — O99212 Obesity complicating pregnancy, second trimester: Secondary | ICD-10-CM | POA: Insufficient documentation

## 2014-04-27 ENCOUNTER — Ambulatory Visit (INDEPENDENT_AMBULATORY_CARE_PROVIDER_SITE_OTHER): Payer: Medicaid Other | Admitting: Obstetrics & Gynecology

## 2014-04-27 VITALS — BP 106/81 | HR 76 | Wt 211.0 lb

## 2014-04-27 DIAGNOSIS — A499 Bacterial infection, unspecified: Secondary | ICD-10-CM

## 2014-04-27 DIAGNOSIS — Z0489 Encounter for examination and observation for other specified reasons: Secondary | ICD-10-CM

## 2014-04-27 DIAGNOSIS — B9689 Other specified bacterial agents as the cause of diseases classified elsewhere: Secondary | ICD-10-CM

## 2014-04-27 DIAGNOSIS — O26892 Other specified pregnancy related conditions, second trimester: Secondary | ICD-10-CM

## 2014-04-27 DIAGNOSIS — N898 Other specified noninflammatory disorders of vagina: Secondary | ICD-10-CM

## 2014-04-27 DIAGNOSIS — IMO0002 Reserved for concepts with insufficient information to code with codable children: Secondary | ICD-10-CM

## 2014-04-27 DIAGNOSIS — N760 Acute vaginitis: Secondary | ICD-10-CM

## 2014-04-27 MED ORDER — FLUCONAZOLE 150 MG PO TABS: 150.0000 mg | ORAL_TABLET | Freq: Once | ORAL | Status: DC

## 2014-04-27 NOTE — Progress Notes (Signed)
Vaginal irritation for a few days, no relief after using external Lotrimin cream. On exam, thick white vaginal discharge seen. Wet prep done. Diflucan presumptively prescribed Of note, anatomy scan was normal except for limited views of heart and spine, follow up scan ordered. Follow up for routine prenatal care.

## 2014-04-27 NOTE — Patient Instructions (Signed)
Return to clinic for any obstetric concerns or go to [facility] for evaluation  

## 2014-04-27 NOTE — Progress Notes (Signed)
Here today for vaginal itching/ irritation.  Next scheduled OB visit is 05/11/14, just came in today for vaginal irritation. Did try over the counter cream with no relief.  Did just find out they are having a boy.

## 2014-04-28 LAB — WET PREP, GENITAL
Trich, Wet Prep: NONE SEEN
Yeast Wet Prep HPF POC: NONE SEEN

## 2014-05-01 MED ORDER — METRONIDAZOLE 500 MG PO TABS: 500.0000 mg | ORAL_TABLET | Freq: Two times a day (BID) | ORAL | Status: AC

## 2014-05-01 NOTE — Addendum Note (Signed)
Addended by: Jaynie CollinsANYANWU, Taira Knabe A on: 05/01/2014 08:03 AM   Modules accepted: Orders

## 2014-05-11 ENCOUNTER — Encounter: Payer: Self-pay | Admitting: Family Medicine

## 2014-05-11 ENCOUNTER — Ambulatory Visit (INDEPENDENT_AMBULATORY_CARE_PROVIDER_SITE_OTHER): Payer: Medicaid Other | Admitting: Family Medicine

## 2014-05-11 VITALS — BP 105/80 | HR 93 | Wt 211.2 lb

## 2014-05-11 DIAGNOSIS — Z3492 Encounter for supervision of normal pregnancy, unspecified, second trimester: Secondary | ICD-10-CM

## 2014-05-11 NOTE — Progress Notes (Signed)
Reports back pain at night with sitting or lying.  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 Has tried muscle relaxers and tylenol Recommend heat and back exercises--might try PT for exercise training if no improvement.

## 2014-05-11 NOTE — Patient Instructions (Addendum)
Back Exercises Back exercises help treat and prevent back injuries. The goal of back exercises is to increase the strength of your abdominal and back muscles and the flexibility of your back. These exercises should be started when you no longer have back pain. Back exercises include:  Pelvic Tilt. Lie on your back with your knees bent. Tilt your pelvis until the lower part of your back is against the floor. Hold this position 5 to 10 sec and repeat 5 to 10 times.  Knee to Chest. Pull first 1 knee up against your chest and hold for 20 to 30 seconds, repeat this with the other knee, and then both knees. This may be done with the other leg straight or bent, whichever feels better.  Sit-Ups or Curl-Ups. Bend your knees 90 degrees. Start with tilting your pelvis, and do a partial, slow sit-up, lifting your trunk only 30 to 45 degrees off the floor. Take at least 2 to 3 seconds for each sit-up. Do not do sit-ups with your knees out straight. If partial sit-ups are difficult, simply do the above but with only tightening your abdominal muscles and holding it as directed.  Hip-Lift. Lie on your back with your knees flexed 90 degrees. Push down with your feet and shoulders as you raise your hips a couple inches off the floor; hold for 10 seconds, repeat 5 to 10 times.  Back arches. Lie on your stomach, propping yourself up on bent elbows. Slowly press on your hands, causing an arch in your low back. Repeat 3 to 5 times. Any initial stiffness and discomfort should lessen with repetition over time.  Shoulder-Lifts. Lie face down with arms beside your body. Keep hips and torso pressed to floor as you slowly lift your head and shoulders off the floor. Do not overdo your exercises, especially in the beginning. Exercises may cause you some mild back discomfort which lasts for a few minutes; however, if the pain is more severe, or lasts for more than 15 minutes, do not continue exercises until you see your caregiver.  Improvement with exercise therapy for back problems is slow.  See your caregivers for assistance with developing a proper back exercise program. Document Released: 05/08/2004 Document Revised: 06/23/2011 Document Reviewed: 01/30/2011 [facility] Patient Information 2015 [place]. This information is not intended to replace advice given to you by your health care provider. Make sure you discuss any questions you have with your health care provider.  Breastfeeding Deciding to breastfeed is one of the best choices you can make for you and your baby. A change in hormones during pregnancy causes your breast tissue to grow and increases the number and size of your milk ducts. These hormones also allow proteins, sugars, and fats from your blood supply to make breast milk in your milk-producing glands. Hormones prevent breast milk from being released before your baby is born as well as prompt milk flow after birth. Once breastfeeding has begun, thoughts of your baby, as well as his or her sucking or crying, can stimulate the release of milk from your milk-producing glands.  BENEFITS OF BREASTFEEDING For Your Baby  Your first milk (colostrum) helps your baby's digestive system function better.   There are antibodies in your milk that help your baby fight off infections.   Your baby has a lower incidence of asthma, allergies, and sudden infant death syndrome.   The nutrients in breast milk are better for your baby than infant formulas and are designed uniquely for your baby's  needs.   Breast milk improves your baby's brain development.   Your baby is less likely to develop other conditions, such as childhood obesity, asthma, or type 2 diabetes mellitus.  For You   Breastfeeding helps to create a very special bond between you and your baby.   Breastfeeding is convenient. Breast milk is always available at the correct temperature and costs nothing.   Breastfeeding helps to burn calories  and helps you lose the weight gained during pregnancy.   Breastfeeding makes your uterus contract to its prepregnancy size faster and slows bleeding (lochia) after you give birth.   Breastfeeding helps to lower your risk of developing type 2 diabetes mellitus, osteoporosis, and breast or ovarian cancer later in life. SIGNS THAT YOUR BABY IS HUNGRY Early Signs of Hunger  Increased alertness or activity.  Stretching.  Movement of the head from side to side.  Movement of the head and opening of the mouth when the corner of the mouth or cheek is stroked (rooting).  Increased sucking sounds, smacking lips, cooing, sighing, or squeaking.  Hand-to-mouth movements.  Increased sucking of fingers or hands. Late Signs of Hunger  Fussing.  Intermittent crying. Extreme Signs of Hunger Signs of extreme hunger will require calming and consoling before your baby will be able to breastfeed successfully. Do not wait for the following signs of extreme hunger to occur before you initiate breastfeeding:   Restlessness.  A loud, strong cry.   Screaming. BREASTFEEDING BASICS Breastfeeding Initiation  Find a comfortable place to sit or lie down, with your neck and back well supported.  Place a pillow or rolled up blanket under your baby to bring him or her to the level of your breast (if you are seated). Nursing pillows are specially designed to help support your arms and your baby while you breastfeed.  Make sure that your baby's abdomen is facing your abdomen.   Gently massage your breast. With your fingertips, massage from your chest wall toward your nipple in a circular motion. This encourages milk flow. You may need to continue this action during the feeding if your milk flows slowly.  Support your breast with 4 fingers underneath and your thumb above your nipple. Make sure your fingers are well away from your nipple and your baby's mouth.   Stroke your baby's lips gently with your  finger or nipple.   When your baby's mouth is open wide enough, quickly bring your baby to your breast, placing your entire nipple and as much of the colored area around your nipple (areola) as possible into your baby's mouth.   More areola should be visible above your baby's upper lip than below the lower lip.   Your baby's tongue should be between his or her lower gum and your breast.   Ensure that your baby's mouth is correctly positioned around your nipple (latched). Your baby's lips should create a seal on your breast and be turned out (everted).  It is common for your baby to suck about 2-3 minutes in order to start the flow of breast milk. Latching Teaching your baby how to latch on to your breast properly is very important. An improper latch can cause nipple pain and decreased milk supply for you and poor weight gain in your baby. Also, if your baby is not latched onto your nipple properly, he or she may swallow some air during feeding. This can make your baby fussy. Burping your baby when you switch breasts during the feeding can help  to get rid of the air. However, teaching your baby to latch on properly is still the best way to prevent fussiness from swallowing air while breastfeeding. Signs that your baby has successfully latched on to your nipple:    Silent tugging or silent sucking, without causing you pain.   Swallowing heard between every 3-4 sucks.    Muscle movement above and in front of his or her ears while sucking.  Signs that your baby has not successfully latched on to nipple:   Sucking sounds or smacking sounds from your baby while breastfeeding.  Nipple pain. If you think your baby has not latched on correctly, slip your finger into the corner of your baby's mouth to break the suction and place it between your baby's gums. Attempt breastfeeding initiation again. Signs of Successful Breastfeeding Signs from your baby:   A gradual decrease in the number of  sucks or complete cessation of sucking.   Falling asleep.   Relaxation of his or her body.   Retention of a small amount of milk in his or her mouth.   Letting go of your breast by himself or herself. Signs from you:  Breasts that have increased in firmness, weight, and size 1-3 hours after feeding.   Breasts that are softer immediately after breastfeeding.  Increased milk volume, as well as a change in milk consistency and color by the fifth day of breastfeeding.   Nipples that are not sore, cracked, or bleeding. Signs That Your Pecola Leisure is Getting Enough Milk  Wetting at least 3 diapers in a 24-hour period. The urine should be clear and pale yellow by age 31 days.  At least 3 stools in a 24-hour period by age 31 days. The stool should be soft and yellow.  At least 3 stools in a 24-hour period by age 28 days. The stool should be seedy and yellow.  No loss of weight greater than 10% of birth weight during the first 77 days of age.  Average weight gain of 4-7 ounces (113-198 g) per week after age 67 days.  Consistent daily weight gain by age 31 days, without weight loss after the age of 2 weeks. After a feeding, your baby may spit up a small amount. This is common. BREASTFEEDING FREQUENCY AND DURATION Frequent feeding will help you make more milk and can prevent sore nipples and breast engorgement. Breastfeed when you feel the need to reduce the fullness of your breasts or when your baby shows signs of hunger. This is called "breastfeeding on demand." Avoid introducing a pacifier to your baby while you are working to establish breastfeeding (the first 4-6 weeks after your baby is born). After this time you may choose to use a pacifier. Research has shown that pacifier use during the first year of a baby's life decreases the risk of sudden infant death syndrome (SIDS). Allow your baby to feed on each breast as long as he or she wants. Breastfeed until your baby is finished feeding. When  your baby unlatches or falls asleep while feeding from the first breast, offer the second breast. Because newborns are often sleepy in the first few weeks of life, you may need to awaken your baby to get him or her to feed. Breastfeeding times will vary from baby to baby. However, the following rules can serve as a guide to help you ensure that your baby is properly fed:  Newborns (babies 70 weeks of age or younger) may breastfeed every 1-3 hours.  Newborns  should not go longer than 3 hours during the day or 5 hours during the night without breastfeeding.  You should breastfeed your baby a minimum of 8 times in a 24-hour period until you begin to introduce solid foods to your baby at around 40 months of age. BREAST MILK PUMPING Pumping and storing breast milk allows you to ensure that your baby is exclusively fed your breast milk, even at times when you are unable to breastfeed. This is especially important if you are going back to work while you are still breastfeeding or when you are not able to be present during feedings. Your lactation consultant can give you guidelines on how long it is safe to store breast milk.  A breast pump is a machine that allows you to pump milk from your breast into a sterile bottle. The pumped breast milk can then be stored in a refrigerator or freezer. Some breast pumps are operated by hand, while others use electricity. Ask your lactation consultant which type will work best for you. Breast pumps can be purchased, but some hospitals and breastfeeding support groups lease breast pumps on a monthly basis. A lactation consultant can teach you how to hand express breast milk, if you prefer not to use a pump.  CARING FOR YOUR BREASTS WHILE YOU BREASTFEED Nipples can become dry, cracked, and sore while breastfeeding. The following recommendations can help keep your breasts moisturized and healthy:  Avoid using soap on your nipples.   Wear a supportive bra. Although not  required, special nursing bras and tank tops are designed to allow access to your breasts for breastfeeding without taking off your entire bra or top. Avoid wearing underwire-style bras or extremely tight bras.  Air dry your nipples for 3-53minutes after each feeding.   Use only cotton bra pads to absorb leaked breast milk. Leaking of breast milk between feedings is normal.   Use lanolin on your nipples after breastfeeding. Lanolin helps to maintain your skin's normal moisture barrier. If you use pure lanolin, you do not need to wash it off before feeding your baby again. Pure lanolin is not toxic to your baby. You may also hand express a few drops of breast milk and gently massage that milk into your nipples and allow the milk to air dry. In the first few weeks after giving birth, some women experience extremely full breasts (engorgement). Engorgement can make your breasts feel heavy, warm, and tender to the touch. Engorgement peaks within 3-5 days after you give birth. The following recommendations can help ease engorgement:  Completely empty your breasts while breastfeeding or pumping. You may want to start by applying warm, moist heat (in the shower or with warm water-soaked hand towels) just before feeding or pumping. This increases circulation and helps the milk flow. If your baby does not completely empty your breasts while breastfeeding, pump any extra milk after he or she is finished.  Wear a snug bra (nursing or regular) or tank top for 1-2 days to signal your body to slightly decrease milk production.  Apply ice packs to your breasts, unless this is too uncomfortable for you.  Make sure that your baby is latched on and positioned properly while breastfeeding. If engorgement persists after 48 hours of following these recommendations, contact your health care provider or a Advertising copywriter. OVERALL HEALTH CARE RECOMMENDATIONS WHILE BREASTFEEDING  Eat healthy foods. Alternate between  meals and snacks, eating 3 of each per day. Because what you eat affects your breast milk,  some of the foods may make your baby more irritable than usual. Avoid eating these foods if you are sure that they are negatively affecting your baby.  Drink milk, fruit juice, and water to satisfy your thirst (about 10 glasses a day).   Rest often, relax, and continue to take your prenatal vitamins to prevent fatigue, stress, and anemia.  Continue breast self-awareness checks.  Avoid chewing and smoking tobacco.  Avoid alcohol and drug use. Some medicines that may be harmful to your baby can pass through breast milk. It is important to ask your health care provider before taking any medicine, including all over-the-counter and prescription medicine as well as vitamin and herbal supplements. It is possible to become pregnant while breastfeeding. If birth control is desired, ask your health care provider about options that will be safe for your baby. SEEK MEDICAL CARE IF:   You feel like you want to stop breastfeeding or have become frustrated with breastfeeding.  You have painful breasts or nipples.  Your nipples are cracked or bleeding.  Your breasts are red, tender, or warm.  You have a swollen area on either breast.  You have a fever or chills.  You have nausea or vomiting.  You have drainage other than breast milk from your nipples.  Your breasts do not become full before feedings by the fifth day after you give birth.  You feel sad and depressed.  Your baby is too sleepy to eat well.  Your baby is having trouble sleeping.   Your baby is wetting less than 3 diapers in a 24-hour period.  Your baby has less than 3 stools in a 24-hour period.  Your baby's skin or the white part of his or her eyes becomes yellow.   Your baby is not gaining weight by 34 days of age. SEEK IMMEDIATE MEDICAL CARE IF:   Your baby is overly tired (lethargic) and does not want to wake up and  feed.  Your baby develops an unexplained fever. Document Released: 03/31/2005 Document Revised: 04/05/2013 Document Reviewed: 09/22/2012 [facility] Patient Information 2015 [place], [place]. This information is not intended to replace advice given to you by your health care provider. Make sure you discuss any questions you have with your health care provider.

## 2014-05-25 ENCOUNTER — Ambulatory Visit (HOSPITAL_COMMUNITY): Payer: Medicaid Other

## 2014-05-30 ENCOUNTER — Ambulatory Visit (HOSPITAL_COMMUNITY)
Admission: RE | Admit: 2014-05-30 | Discharge: 2014-05-30 | Disposition: A | Discharge status: Home / Self Care | Payer: Medicaid Other | Source: Ambulatory Visit | Attending: Obstetrics & Gynecology | Admitting: Obstetrics & Gynecology

## 2014-05-30 DIAGNOSIS — O99212 Obesity complicating pregnancy, second trimester: Secondary | ICD-10-CM | POA: Diagnosis present

## 2014-05-30 DIAGNOSIS — O99214 Obesity complicating childbirth: Secondary | ICD-10-CM | POA: Insufficient documentation

## 2014-05-30 DIAGNOSIS — Z3A27 27 weeks gestation of pregnancy: Secondary | ICD-10-CM | POA: Diagnosis not present

## 2014-05-30 DIAGNOSIS — IMO0002 Reserved for concepts with insufficient information to code with codable children: Secondary | ICD-10-CM

## 2014-05-30 DIAGNOSIS — Z36 Encounter for antenatal screening of mother: Secondary | ICD-10-CM | POA: Insufficient documentation

## 2014-05-30 DIAGNOSIS — Z0489 Encounter for examination and observation for other specified reasons: Secondary | ICD-10-CM

## 2014-05-30 DIAGNOSIS — O99210 Obesity complicating pregnancy, unspecified trimester: Secondary | ICD-10-CM

## 2014-06-06 ENCOUNTER — Encounter: Payer: Self-pay | Admitting: Obstetrics & Gynecology

## 2014-06-06 ENCOUNTER — Ambulatory Visit (INDEPENDENT_AMBULATORY_CARE_PROVIDER_SITE_OTHER): Payer: Medicaid Other | Admitting: Obstetrics & Gynecology

## 2014-06-06 VITALS — BP 107/75 | HR 80 | Wt 218.4 lb

## 2014-06-06 DIAGNOSIS — Z3492 Encounter for supervision of normal pregnancy, unspecified, second trimester: Secondary | ICD-10-CM

## 2014-06-06 NOTE — Progress Notes (Signed)
Routine visit. Good FM. No problems except LBP. I have shown her exercises. If no help, rec chiro. Glucola, tdap, labs at next visit.

## 2014-06-20 ENCOUNTER — Encounter: Payer: Self-pay | Admitting: Family Medicine

## 2014-06-20 ENCOUNTER — Ambulatory Visit (INDEPENDENT_AMBULATORY_CARE_PROVIDER_SITE_OTHER): Payer: Medicaid Other | Admitting: Family Medicine

## 2014-06-20 VITALS — BP 96/55 | HR 88 | Wt 218.0 lb

## 2014-06-20 DIAGNOSIS — Z23 Encounter for immunization: Secondary | ICD-10-CM

## 2014-06-20 DIAGNOSIS — Z3492 Encounter for supervision of normal pregnancy, unspecified, second trimester: Secondary | ICD-10-CM

## 2014-06-20 DIAGNOSIS — Z36 Encounter for antenatal screening of mother: Secondary | ICD-10-CM

## 2014-06-20 LAB — CBC WITH DIFFERENTIAL/PLATELET
BASOS PCT: 0 % (ref 0–1)
Basophils Absolute: 0 10*3/uL (ref 0.0–0.1)
Eosinophils Absolute: 0.2 10*3/uL (ref 0.0–0.7)
Eosinophils Relative: 2 % (ref 0–5)
HCT: 32.7 % — ABNORMAL LOW (ref 36.0–46.0)
Hemoglobin: 10.3 g/dL — ABNORMAL LOW (ref 12.0–15.0)
Lymphocytes Relative: 20 % (ref 12–46)
Lymphs Abs: 1.7 10*3/uL (ref 0.7–4.0)
MCH: 24.9 pg — ABNORMAL LOW (ref 26.0–34.0)
MCHC: 31.5 g/dL (ref 30.0–36.0)
MCV: 79 fL (ref 78.0–100.0)
MPV: 9.8 fL (ref 8.6–12.4)
Monocytes Absolute: 0.4 10*3/uL (ref 0.1–1.0)
Monocytes Relative: 5 % (ref 3–12)
NEUTROS ABS: 6.2 10*3/uL (ref 1.7–7.7)
NEUTROS PCT: 73 % (ref 43–77)
Platelets: 286 10*3/uL (ref 150–400)
RBC: 4.14 MIL/uL (ref 3.87–5.11)
RDW: 15.3 % (ref 11.5–15.5)
WBC: 8.5 10*3/uL (ref 4.0–10.5)

## 2014-06-20 NOTE — Progress Notes (Signed)
Adjusted due date to to early u/s in radiology--9/22 6 wk 3 day CRL. 28 wk labs today. Foot itching? Relation, denies other itching--if persists, consider checking bile salts.

## 2014-06-20 NOTE — Patient Instructions (Signed)
Breastfeeding Deciding to breastfeed is one of the best choices you can make for you and your baby. A change in hormones during pregnancy causes your breast tissue to grow and increases the number and size of your milk ducts. These hormones also allow proteins, sugars, and fats from your blood supply to make breast milk in your milk-producing glands. Hormones prevent breast milk from being released before your baby is born as well as prompt milk flow after birth. Once breastfeeding has begun, thoughts of your baby, as well as his or her sucking or crying, can stimulate the release of milk from your milk-producing glands.  BENEFITS OF BREASTFEEDING For Your Baby  Your first milk (colostrum) helps your baby's digestive system function better.   There are antibodies in your milk that help your baby fight off infections.   Your baby has a lower incidence of asthma, allergies, and sudden infant death syndrome.   The nutrients in breast milk are better for your baby than infant formulas and are designed uniquely for your baby's needs.   Breast milk improves your baby's brain development.   Your baby is less likely to develop other conditions, such as childhood obesity, asthma, or type 2 diabetes mellitus.  For You   Breastfeeding helps to create a very special bond between you and your baby.   Breastfeeding is convenient. Breast milk is always available at the correct temperature and costs nothing.   Breastfeeding helps to burn calories and helps you lose the weight gained during pregnancy.   Breastfeeding makes your uterus contract to its prepregnancy size faster and slows bleeding (lochia) after you give birth.   Breastfeeding helps to lower your risk of developing type 2 diabetes mellitus, osteoporosis, and breast or ovarian cancer later in life. SIGNS THAT YOUR BABY IS HUNGRY Early Signs of Hunger  Increased alertness or activity.  Stretching.  Movement of the head from  side to side.  Movement of the head and opening of the mouth when the corner of the mouth or cheek is stroked (rooting).  Increased sucking sounds, smacking lips, cooing, sighing, or squeaking.  Hand-to-mouth movements.  Increased sucking of fingers or hands. Late Signs of Hunger  Fussing.  Intermittent crying. Extreme Signs of Hunger Signs of extreme hunger will require calming and consoling before your baby will be able to breastfeed successfully. Do not wait for the following signs of extreme hunger to occur before you initiate breastfeeding:   Restlessness.  A loud, strong cry.   Screaming. BREASTFEEDING BASICS Breastfeeding Initiation  Find a comfortable place to sit or lie down, with your neck and back well supported.  Place a pillow or rolled up blanket under your baby to bring him or her to the level of your breast (if you are seated). Nursing pillows are specially designed to help support your arms and your baby while you breastfeed.  Make sure that your baby's abdomen is facing your abdomen.   Gently massage your breast. With your fingertips, massage from your chest wall toward your nipple in a circular motion. This encourages milk flow. You may need to continue this action during the feeding if your milk flows slowly.  Support your breast with 4 fingers underneath and your thumb above your nipple. Make sure your fingers are well away from your nipple and your baby's mouth.   Stroke your baby's lips gently with your finger or nipple.   When your baby's mouth is open wide enough, quickly bring your baby to your   breast, placing your entire nipple and as much of the colored area around your nipple (areola) as possible into your baby's mouth.   More areola should be visible above your baby's upper lip than below the lower lip.   Your baby's tongue should be between his or her lower gum and your breast.   Ensure that your baby's mouth is correctly positioned  around your nipple (latched). Your baby's lips should create a seal on your breast and be turned out (everted).  It is common for your baby to suck about 2-3 minutes in order to start the flow of breast milk. Latching Teaching your baby how to latch on to your breast properly is very important. An improper latch can cause nipple pain and decreased milk supply for you and poor weight gain in your baby. Also, if your baby is not latched onto your nipple properly, he or she may swallow some air during feeding. This can make your baby fussy. Burping your baby when you switch breasts during the feeding can help to get rid of the air. However, teaching your baby to latch on properly is still the best way to prevent fussiness from swallowing air while breastfeeding. Signs that your baby has successfully latched on to your nipple:    Silent tugging or silent sucking, without causing you pain.   Swallowing heard between every 3-4 sucks.    Muscle movement above and in front of his or her ears while sucking.  Signs that your baby has not successfully latched on to nipple:   Sucking sounds or smacking sounds from your baby while breastfeeding.  Nipple pain. If you think your baby has not latched on correctly, slip your finger into the corner of your baby's mouth to break the suction and place it between your baby's gums. Attempt breastfeeding initiation again. Signs of Successful Breastfeeding Signs from your baby:   A gradual decrease in the number of sucks or complete cessation of sucking.   Falling asleep.   Relaxation of his or her body.   Retention of a small amount of milk in his or her mouth.   Letting go of your breast by himself or herself. Signs from you:  Breasts that have increased in firmness, weight, and size 1-3 hours after feeding.   Breasts that are softer immediately after breastfeeding.  Increased milk volume, as well as a change in milk consistency and color by  the fifth day of breastfeeding.   Nipples that are not sore, cracked, or bleeding. Signs That Your Baby is Getting Enough Milk  Wetting at least 3 diapers in a 24-hour period. The urine should be clear and pale yellow by age 5 days.  At least 3 stools in a 24-hour period by age 5 days. The stool should be soft and yellow.  At least 3 stools in a 24-hour period by age 7 days. The stool should be seedy and yellow.  No loss of weight greater than 10% of birth weight during the first 3 days of age.  Average weight gain of 4-7 ounces (113-198 g) per week after age 4 days.  Consistent daily weight gain by age 5 days, without weight loss after the age of 2 weeks. After a feeding, your baby may spit up a small amount. This is common. BREASTFEEDING FREQUENCY AND DURATION Frequent feeding will help you make more milk and can prevent sore nipples and breast engorgement. Breastfeed when you feel the need to reduce the fullness of your breasts   or when your baby shows signs of hunger. This is called "breastfeeding on demand." Avoid introducing a pacifier to your baby while you are working to establish breastfeeding (the first 4-6 weeks after your baby is born). After this time you may choose to use a pacifier. Research has shown that pacifier use during the first year of a baby's life decreases the risk of sudden infant death syndrome (SIDS). Allow your baby to feed on each breast as long as he or she wants. Breastfeed until your baby is finished feeding. When your baby unlatches or falls asleep while feeding from the first breast, offer the second breast. Because newborns are often sleepy in the first few weeks of life, you may need to awaken your baby to get him or her to feed. Breastfeeding times will vary from baby to baby. However, the following rules can serve as a guide to help you ensure that your baby is properly fed:  Newborns (babies 4 weeks of age or younger) may breastfeed every 1-3  hours.  Newborns should not go longer than 3 hours during the day or 5 hours during the night without breastfeeding.  You should breastfeed your baby a minimum of 8 times in a 24-hour period until you begin to introduce solid foods to your baby at around 6 months of age. BREAST MILK PUMPING Pumping and storing breast milk allows you to ensure that your baby is exclusively fed your breast milk, even at times when you are unable to breastfeed. This is especially important if you are going back to work while you are still breastfeeding or when you are not able to be present during feedings. Your lactation consultant can give you guidelines on how long it is safe to store breast milk.  A breast pump is a machine that allows you to pump milk from your breast into a sterile bottle. The pumped breast milk can then be stored in a refrigerator or freezer. Some breast pumps are operated by hand, while others use electricity. Ask your lactation consultant which type will work best for you. Breast pumps can be purchased, but some hospitals and breastfeeding support groups lease breast pumps on a monthly basis. A lactation consultant can teach you how to hand express breast milk, if you prefer not to use a pump.  CARING FOR YOUR BREASTS WHILE YOU BREASTFEED Nipples can become dry, cracked, and sore while breastfeeding. The following recommendations can help keep your breasts moisturized and healthy:  Avoid using soap on your nipples.   Wear a supportive bra. Although not required, special nursing bras and tank tops are designed to allow access to your breasts for breastfeeding without taking off your entire bra or top. Avoid wearing underwire-style bras or extremely tight bras.  Air dry your nipples for 3-4minutes after each feeding.   Use only cotton bra pads to absorb leaked breast milk. Leaking of breast milk between feedings is normal.   Use lanolin on your nipples after breastfeeding. Lanolin helps to  maintain your skin's normal moisture barrier. If you use pure lanolin, you do not need to wash it off before feeding your baby again. Pure lanolin is not toxic to your baby. You may also hand express a few drops of breast milk and gently massage that milk into your nipples and allow the milk to air dry. In the first few weeks after giving birth, some women experience extremely full breasts (engorgement). Engorgement can make your breasts feel heavy, warm, and tender to the   touch. Engorgement peaks within 3-5 days after you give birth. The following recommendations can help ease engorgement:  Completely empty your breasts while breastfeeding or pumping. You may want to start by applying warm, moist heat (in the shower or with warm water-soaked hand towels) just before feeding or pumping. This increases circulation and helps the milk flow. If your baby does not completely empty your breasts while breastfeeding, pump any extra milk after he or she is finished.  Wear a snug bra (nursing or regular) or tank top for 1-2 days to signal your body to slightly decrease milk production.  Apply ice packs to your breasts, unless this is too uncomfortable for you.  Make sure that your baby is latched on and positioned properly while breastfeeding. If engorgement persists after 48 hours of following these recommendations, contact your health care provider or a lactation consultant. OVERALL HEALTH CARE RECOMMENDATIONS WHILE BREASTFEEDING  Eat healthy foods. Alternate between meals and snacks, eating 3 of each per day. Because what you eat affects your breast milk, some of the foods may make your baby more irritable than usual. Avoid eating these foods if you are sure that they are negatively affecting your baby.  Drink milk, fruit juice, and water to satisfy your thirst (about 10 glasses a day).   Rest often, relax, and continue to take your prenatal vitamins to prevent fatigue, stress, and anemia.  Continue  breast self-awareness checks.  Avoid chewing and smoking tobacco.  Avoid alcohol and drug use. Some medicines that may be harmful to your baby can pass through breast milk. It is important to ask your health care provider before taking any medicine, including all over-the-counter and prescription medicine as well as vitamin and herbal supplements. It is possible to become pregnant while breastfeeding. If birth control is desired, ask your health care provider about options that will be safe for your baby. SEEK MEDICAL CARE IF:   You feel like you want to stop breastfeeding or have become frustrated with breastfeeding.  You have painful breasts or nipples.  Your nipples are cracked or bleeding.  Your breasts are red, tender, or warm.  You have a swollen area on either breast.  You have a fever or chills.  You have nausea or vomiting.  You have drainage other than breast milk from your nipples.  Your breasts do not become full before feedings by the fifth day after you give birth.  You feel sad and depressed.  Your baby is too sleepy to eat well.  Your baby is having trouble sleeping.   Your baby is wetting less than 3 diapers in a 24-hour period.  Your baby has less than 3 stools in a 24-hour period.  Your baby's skin or the white part of his or her eyes becomes yellow.   Your baby is not gaining weight by 5 days of age. SEEK IMMEDIATE MEDICAL CARE IF:   Your baby is overly tired (lethargic) and does not want to wake up and feed.  Your baby develops an unexplained fever. Document Released: 03/31/2005 Document Revised: 04/05/2013 Document Reviewed: 09/22/2012 [employer] Patient Information 2015 [employer]. This information is not intended to replace advice given to you by your health care provider. Make sure you discuss any questions you have with your health care provider.  Preterm Labor Information Preterm labor is when labor starts at less than 37 weeks of  pregnancy. The normal length of a pregnancy is 39 to 41 weeks. CAUSES Often, there is no identifiable   underlying cause as to why a woman goes into preterm labor. One of the most common known causes of preterm labor is infection. Infections of the uterus, cervix, vagina, amniotic sac, bladder, kidney, or even the lungs (pneumonia) can cause labor to start. Other suspected causes of preterm labor include:   Urogenital infections, such as yeast infections and bacterial vaginosis.   Uterine abnormalities (uterine shape, uterine septum, fibroids, or bleeding from the placenta).   A cervix that has been operated on (it may fail to stay closed).   Malformations in the fetus.   Multiple gestations (twins, triplets, and so on).   Breakage of the amniotic sac.  RISK FACTORS  Having a previous history of preterm labor.   Having premature rupture of membranes (PROM).   Having a placenta that covers the opening of the cervix (placenta previa).   Having a placenta that separates from the uterus (placental abruption).   Having a cervix that is too weak to hold the fetus in the uterus (incompetent cervix).   Having too much fluid in the amniotic sac (polyhydramnios).   Taking illegal drugs or smoking while pregnant.   Not gaining enough weight while pregnant.   Being younger than 18 and older than 31 years old.   Having a low socioeconomic status.   Being African American. SYMPTOMS Signs and symptoms of preterm labor include:   Menstrual-like cramps, abdominal pain, or back pain.  Uterine contractions that are regular, as frequent as six in an hour, regardless of their intensity (may be mild or painful).  Contractions that start on the top of the uterus and spread down to the lower abdomen and back.   A sense of increased pelvic pressure.   A watery or bloody mucus discharge that comes from the vagina.  TREATMENT Depending on the length of the pregnancy and other  circumstances, your health care provider may suggest bed rest. If necessary, there are medicines that can be given to stop contractions and to mature the fetal lungs. If labor happens before 34 weeks of pregnancy, a prolonged hospital stay may be recommended. Treatment depends on the condition of both you and the fetus.  WHAT SHOULD YOU DO IF YOU THINK YOU ARE IN PRETERM LABOR? Call your health care provider right away. You will need to go to the hospital to get checked immediately. HOW CAN YOU PREVENT PRETERM LABOR IN FUTURE PREGNANCIES? You should:   Stop smoking if you smoke.  Maintain healthy weight gain and avoid chemicals and drugs that are not necessary.  Be watchful for any type of infection.  Inform your health care provider if you have a known history of preterm labor. Document Released: 06/21/2003 Document Revised: 12/01/2012 Document Reviewed: 05/03/2012 [employer] Patient Information 2015 [employer]. This information is not intended to replace advice given to you by your health care provider. Make sure you discuss any questions you have with your health care provider.  

## 2014-06-20 NOTE — Progress Notes (Signed)
Patient is complaining of itching on her feet and the balls of her feet feel like there is actually a ball in them.

## 2014-06-20 NOTE — Addendum Note (Signed)
Addended by: Barbara CowerNOGUES, Jailani Hogans L on: 06/20/2014 02:31 PM   Modules accepted: Orders

## 2014-06-21 LAB — GLUCOSE TOLERANCE, 1 HOUR (50G) W/O FASTING: Glucose, 1 Hour GTT: 124 mg/dL (ref 70–140)

## 2014-06-21 LAB — RPR

## 2014-06-21 LAB — HIV ANTIBODY (ROUTINE TESTING W REFLEX): HIV: NONREACTIVE

## 2014-07-04 ENCOUNTER — Ambulatory Visit (INDEPENDENT_AMBULATORY_CARE_PROVIDER_SITE_OTHER): Payer: Medicaid Other | Admitting: Family Medicine

## 2014-07-04 VITALS — BP 101/70 | HR 90 | Wt 213.0 lb

## 2014-07-04 DIAGNOSIS — Z3493 Encounter for supervision of normal pregnancy, unspecified, third trimester: Secondary | ICD-10-CM

## 2014-07-04 NOTE — Patient Instructions (Signed)
Third Trimester of Pregnancy The third trimester is from week 29 through week 42, months 7 through 9. The third trimester is a time when the fetus is growing rapidly. At the end of the ninth month, the fetus is about 20 inches in length and weighs 6-10 pounds.  BODY CHANGES Your body goes through many changes during pregnancy. The changes vary from woman to woman.   Your weight will continue to increase. You can expect to gain 25-35 pounds (11-16 kg) by the end of the pregnancy.  You may begin to get stretch marks on your hips, abdomen, and breasts.  You may urinate more often because the fetus is moving lower into your pelvis and pressing on your bladder.  You may develop or continue to have heartburn as a result of your pregnancy.  You may develop constipation because certain hormones are causing the muscles that push waste through your intestines to slow down.  You may develop hemorrhoids or swollen, bulging veins (varicose veins).  You may have pelvic pain because of the weight gain and pregnancy hormones relaxing your joints between the bones in your pelvis. Backaches may result from overexertion of the muscles supporting your posture.  You may have changes in your hair. These can include thickening of your hair, rapid growth, and changes in texture. Some women also have hair loss during or after pregnancy, or hair that feels dry or thin. Your hair will most likely return to normal after your baby is born.  Your breasts will continue to grow and be tender. A yellow discharge may leak from your breasts called colostrum.  Your belly button may stick out.  You may feel short of breath because of your expanding uterus.  You may notice the fetus "dropping," or moving lower in your abdomen.  You may have a bloody mucus discharge. This usually occurs a few days to a week before labor begins.  Your cervix becomes thin and soft (effaced) near your due date. WHAT TO EXPECT AT YOUR  PRENATAL EXAMS  You will have prenatal exams every 2 weeks until week 36. Then, you will have weekly prenatal exams. During a routine prenatal visit:  You will be weighed to make sure you and the fetus are growing normally.  Your blood pressure is taken.  Your abdomen will be measured to track your baby's growth.  The fetal heartbeat will be listened to.  Any test results from the previous visit will be discussed.  You may have a cervical check near your due date to see if you have effaced. At around 36 weeks, your caregiver will check your cervix. At the same time, your caregiver will also perform a test on the secretions of the vaginal tissue. This test is to determine if a type of bacteria, Group B streptococcus, is present. Your caregiver will explain this further. Your caregiver may ask you:  What your birth plan is.  How you are feeling.  If you are feeling the baby move.  If you have had any abnormal symptoms, such as leaking fluid, bleeding, severe headaches, or abdominal cramping.  If you have any questions. Other tests or screenings that may be performed during your third trimester include:  Blood tests that check for low iron levels (anemia).  Fetal testing to check the health, activity level, and growth of the fetus. Testing is done if you have certain medical conditions or if there are problems during the pregnancy. FALSE LABOR You may feel small, irregular contractions that   eventually go away. These are called Braxton Hicks contractions, or false labor. Contractions may last for hours, days, or even weeks before true labor sets in. If contractions come at regular intervals, intensify, or become painful, it is best to be seen by your caregiver.  SIGNS OF LABOR   Menstrual-like cramps.  Contractions that are 5 minutes apart or less.  Contractions that start on the top of the uterus and spread down to the lower abdomen and back.  A sense of increased pelvic  pressure or back pain.  A watery or bloody mucus discharge that comes from the vagina. If you have any of these signs before the 37th week of pregnancy, call your caregiver right away. You need to go to the hospital to get checked immediately. HOME CARE INSTRUCTIONS   Avoid all smoking, herbs, alcohol, and unprescribed drugs. These chemicals affect the formation and growth of the baby.  Follow your caregiver's instructions regarding medicine use. There are medicines that are either safe or unsafe to take during pregnancy.  Exercise only as directed by your caregiver. Experiencing uterine cramps is a good sign to stop exercising.  Continue to eat regular, healthy meals.  Wear a good support bra for breast tenderness.  Do not use hot tubs, steam rooms, or saunas.  Wear your seat belt at all times when driving.  Avoid raw meat, uncooked cheese, cat litter boxes, and soil used by cats. These carry germs that can cause birth defects in the baby.  Take your prenatal vitamins.  Try taking a stool softener (if your caregiver approves) if you develop constipation. Eat more high-fiber foods, such as fresh vegetables or fruit and whole grains. Drink plenty of fluids to keep your urine clear or pale yellow.  Take warm sitz baths to soothe any pain or discomfort caused by hemorrhoids. Use hemorrhoid cream if your caregiver approves.  If you develop varicose veins, wear support hose. Elevate your feet for 15 minutes, 3-4 times a day. Limit salt in your diet.  Avoid heavy lifting, wear low heal shoes, and practice good posture.  Rest a lot with your legs elevated if you have leg cramps or low back pain.  Visit your dentist if you have not gone during your pregnancy. Use a soft toothbrush to brush your teeth and be gentle when you floss.  A sexual relationship may be continued unless your caregiver directs you otherwise.  Do not travel far distances unless it is absolutely necessary and only  with the approval of your caregiver.  Take prenatal classes to understand, practice, and ask questions about the labor and delivery.  Make a trial run to the hospital.  Pack your hospital bag.  Prepare the baby's nursery.  Continue to go to all your prenatal visits as directed by your caregiver. SEEK MEDICAL CARE IF:  You are unsure if you are in labor or if your water has broken.  You have dizziness.  You have mild pelvic cramps, pelvic pressure, or nagging pain in your abdominal area.  You have persistent nausea, vomiting, or diarrhea.  You have a bad smelling vaginal discharge.  You have pain with urination. SEEK IMMEDIATE MEDICAL CARE IF:   You have a fever.  You are leaking fluid from your vagina.  You have spotting or bleeding from your vagina.  You have severe abdominal cramping or pain.  You have rapid weight loss or gain.  You have shortness of breath with chest pain.  You notice sudden or extreme swelling   of your face, hands, ankles, feet, or legs.  You have not felt your baby move in over an hour.  You have severe headaches that do not go away with medicine.  You have vision changes. Document Released: 03/25/2001 Document Revised: 04/05/2013 Document Reviewed: 06/01/2012 [employer] Patient Information 2015 [employer]. This information is not intended to replace advice given to you by your health care provider. Make sure you discuss any questions you have with your health care provider.  Breastfeeding Deciding to breastfeed is one of the best choices you can make for you and your baby. A change in hormones during pregnancy causes your breast tissue to grow and increases the number and size of your milk ducts. These hormones also allow proteins, sugars, and fats from your blood supply to make breast milk in your milk-producing glands. Hormones prevent breast milk from being released before your baby is born as well as prompt milk flow after birth. Once  breastfeeding has begun, thoughts of your baby, as well as his or her sucking or crying, can stimulate the release of milk from your milk-producing glands.  BENEFITS OF BREASTFEEDING For Your Baby  Your first milk (colostrum) helps your baby's digestive system function better.   There are antibodies in your milk that help your baby fight off infections.   Your baby has a lower incidence of asthma, allergies, and sudden infant death syndrome.   The nutrients in breast milk are better for your baby than infant formulas and are designed uniquely for your baby's needs.   Breast milk improves your baby's brain development.   Your baby is less likely to develop other conditions, such as childhood obesity, asthma, or type 2 diabetes mellitus.  For You   Breastfeeding helps to create a very special bond between you and your baby.   Breastfeeding is convenient. Breast milk is always available at the correct temperature and costs nothing.   Breastfeeding helps to burn calories and helps you lose the weight gained during pregnancy.   Breastfeeding makes your uterus contract to its prepregnancy size faster and slows bleeding (lochia) after you give birth.   Breastfeeding helps to lower your risk of developing type 2 diabetes mellitus, osteoporosis, and breast or ovarian cancer later in life. SIGNS THAT YOUR BABY IS HUNGRY Early Signs of Hunger  Increased alertness or activity.  Stretching.  Movement of the head from side to side.  Movement of the head and opening of the mouth when the corner of the mouth or cheek is stroked (rooting).  Increased sucking sounds, smacking lips, cooing, sighing, or squeaking.  Hand-to-mouth movements.  Increased sucking of fingers or hands. Late Signs of Hunger  Fussing.  Intermittent crying. Extreme Signs of Hunger Signs of extreme hunger will require calming and consoling before your baby will be able to breastfeed successfully. Do not  wait for the following signs of extreme hunger to occur before you initiate breastfeeding:   Restlessness.  A loud, strong cry.   Screaming. BREASTFEEDING BASICS Breastfeeding Initiation  Find a comfortable place to sit or lie down, with your neck and back well supported.  Place a pillow or rolled up blanket under your baby to bring him or her to the level of your breast (if you are seated). Nursing pillows are specially designed to help support your arms and your baby while you breastfeed.  Make sure that your baby's abdomen is facing your abdomen.   Gently massage your breast. With your fingertips, massage from your chest   wall toward your nipple in a circular motion. This encourages milk flow. You may need to continue this action during the feeding if your milk flows slowly.  Support your breast with 4 fingers underneath and your thumb above your nipple. Make sure your fingers are well away from your nipple and your baby's mouth.   Stroke your baby's lips gently with your finger or nipple.   When your baby's mouth is open wide enough, quickly bring your baby to your breast, placing your entire nipple and as much of the colored area around your nipple (areola) as possible into your baby's mouth.   More areola should be visible above your baby's upper lip than below the lower lip.   Your baby's tongue should be between his or her lower gum and your breast.   Ensure that your baby's mouth is correctly positioned around your nipple (latched). Your baby's lips should create a seal on your breast and be turned out (everted).  It is common for your baby to suck about 2-3 minutes in order to start the flow of breast milk. Latching Teaching your baby how to latch on to your breast properly is very important. An improper latch can cause nipple pain and decreased milk supply for you and poor weight gain in your baby. Also, if your baby is not latched onto your nipple properly, he or she  may swallow some air during feeding. This can make your baby fussy. Burping your baby when you switch breasts during the feeding can help to get rid of the air. However, teaching your baby to latch on properly is still the best way to prevent fussiness from swallowing air while breastfeeding. Signs that your baby has successfully latched on to your nipple:    Silent tugging or silent sucking, without causing you pain.   Swallowing heard between every 3-4 sucks.    Muscle movement above and in front of his or her ears while sucking.  Signs that your baby has not successfully latched on to nipple:   Sucking sounds or smacking sounds from your baby while breastfeeding.  Nipple pain. If you think your baby has not latched on correctly, slip your finger into the corner of your baby's mouth to break the suction and place it between your baby's gums. Attempt breastfeeding initiation again. Signs of Successful Breastfeeding Signs from your baby:   A gradual decrease in the number of sucks or complete cessation of sucking.   Falling asleep.   Relaxation of his or her body.   Retention of a small amount of milk in his or her mouth.   Letting go of your breast by himself or herself. Signs from you:  Breasts that have increased in firmness, weight, and size 1-3 hours after feeding.   Breasts that are softer immediately after breastfeeding.  Increased milk volume, as well as a change in milk consistency and color by the fifth day of breastfeeding.   Nipples that are not sore, cracked, or bleeding. Signs That Your Baby is Getting Enough Milk  Wetting at least 3 diapers in a 24-hour period. The urine should be clear and pale yellow by age 5 days.  At least 3 stools in a 24-hour period by age 5 days. The stool should be soft and yellow.  At least 3 stools in a 24-hour period by age 7 days. The stool should be seedy and yellow.  No loss of weight greater than 10% of birth weight  during the first 3   days of age.  Average weight gain of 4-7 ounces (113-198 g) per week after age 4 days.  Consistent daily weight gain by age 5 days, without weight loss after the age of 2 weeks. After a feeding, your baby may spit up a small amount. This is common. BREASTFEEDING FREQUENCY AND DURATION Frequent feeding will help you make more milk and can prevent sore nipples and breast engorgement. Breastfeed when you feel the need to reduce the fullness of your breasts or when your baby shows signs of hunger. This is called "breastfeeding on demand." Avoid introducing a pacifier to your baby while you are working to establish breastfeeding (the first 4-6 weeks after your baby is born). After this time you may choose to use a pacifier. Research has shown that pacifier use during the first year of a baby's life decreases the risk of sudden infant death syndrome (SIDS). Allow your baby to feed on each breast as long as he or she wants. Breastfeed until your baby is finished feeding. When your baby unlatches or falls asleep while feeding from the first breast, offer the second breast. Because newborns are often sleepy in the first few weeks of life, you may need to awaken your baby to get him or her to feed. Breastfeeding times will vary from baby to baby. However, the following rules can serve as a guide to help you ensure that your baby is properly fed:  Newborns (babies 4 weeks of age or younger) may breastfeed every 1-3 hours.  Newborns should not go longer than 3 hours during the day or 5 hours during the night without breastfeeding.  You should breastfeed your baby a minimum of 8 times in a 24-hour period until you begin to introduce solid foods to your baby at around 6 months of age. BREAST MILK PUMPING Pumping and storing breast milk allows you to ensure that your baby is exclusively fed your breast milk, even at times when you are unable to breastfeed. This is especially important if you are  going back to work while you are still breastfeeding or when you are not able to be present during feedings. Your lactation consultant can give you guidelines on how long it is safe to store breast milk.  A breast pump is a machine that allows you to pump milk from your breast into a sterile bottle. The pumped breast milk can then be stored in a refrigerator or freezer. Some breast pumps are operated by hand, while others use electricity. Ask your lactation consultant which type will work best for you. Breast pumps can be purchased, but some hospitals and breastfeeding support groups lease breast pumps on a monthly basis. A lactation consultant can teach you how to hand express breast milk, if you prefer not to use a pump.  CARING FOR YOUR BREASTS WHILE YOU BREASTFEED Nipples can become dry, cracked, and sore while breastfeeding. The following recommendations can help keep your breasts moisturized and healthy:  Avoid using soap on your nipples.   Wear a supportive bra. Although not required, special nursing bras and tank tops are designed to allow access to your breasts for breastfeeding without taking off your entire bra or top. Avoid wearing underwire-style bras or extremely tight bras.  Air dry your nipples for 3-4minutes after each feeding.   Use only cotton bra pads to absorb leaked breast milk. Leaking of breast milk between feedings is normal.   Use lanolin on your nipples after breastfeeding. Lanolin helps to maintain your skin's   normal moisture barrier. If you use pure lanolin, you do not need to wash it off before feeding your baby again. Pure lanolin is not toxic to your baby. You may also hand express a few drops of breast milk and gently massage that milk into your nipples and allow the milk to air dry. In the first few weeks after giving birth, some women experience extremely full breasts (engorgement). Engorgement can make your breasts feel heavy, warm, and tender to the touch.  Engorgement peaks within 3-5 days after you give birth. The following recommendations can help ease engorgement:  Completely empty your breasts while breastfeeding or pumping. You may want to start by applying warm, moist heat (in the shower or with warm water-soaked hand towels) just before feeding or pumping. This increases circulation and helps the milk flow. If your baby does not completely empty your breasts while breastfeeding, pump any extra milk after he or she is finished.  Wear a snug bra (nursing or regular) or tank top for 1-2 days to signal your body to slightly decrease milk production.  Apply ice packs to your breasts, unless this is too uncomfortable for you.  Make sure that your baby is latched on and positioned properly while breastfeeding. If engorgement persists after 48 hours of following these recommendations, contact your health care provider or a lactation consultant. OVERALL HEALTH CARE RECOMMENDATIONS WHILE BREASTFEEDING  Eat healthy foods. Alternate between meals and snacks, eating 3 of each per day. Because what you eat affects your breast milk, some of the foods may make your baby more irritable than usual. Avoid eating these foods if you are sure that they are negatively affecting your baby.  Drink milk, fruit juice, and water to satisfy your thirst (about 10 glasses a day).   Rest often, relax, and continue to take your prenatal vitamins to prevent fatigue, stress, and anemia.  Continue breast self-awareness checks.  Avoid chewing and smoking tobacco.  Avoid alcohol and drug use. Some medicines that may be harmful to your baby can pass through breast milk. It is important to ask your health care provider before taking any medicine, including all over-the-counter and prescription medicine as well as vitamin and herbal supplements. It is possible to become pregnant while breastfeeding. If birth control is desired, ask your health care provider about options that  will be safe for your baby. SEEK MEDICAL CARE IF:   You feel like you want to stop breastfeeding or have become frustrated with breastfeeding.  You have painful breasts or nipples.  Your nipples are cracked or bleeding.  Your breasts are red, tender, or warm.  You have a swollen area on either breast.  You have a fever or chills.  You have nausea or vomiting.  You have drainage other than breast milk from your nipples.  Your breasts do not become full before feedings by the fifth day after you give birth.  You feel sad and depressed.  Your baby is too sleepy to eat well.  Your baby is having trouble sleeping.   Your baby is wetting less than 3 diapers in a 24-hour period.  Your baby has less than 3 stools in a 24-hour period.  Your baby's skin or the white part of his or her eyes becomes yellow.   Your baby is not gaining weight by 5 days of age. SEEK IMMEDIATE MEDICAL CARE IF:   Your baby is overly tired (lethargic) and does not want to wake up and feed.  Your baby   develops an unexplained fever. Document Released: 03/31/2005 Document Revised: 04/05/2013 Document Reviewed: 09/22/2012 [employer] Patient Information 2015 [employer]. This information is not intended to replace advice given to you by your health care provider. Make sure you discuss any questions you have with your health care provider.  

## 2014-07-04 NOTE — Progress Notes (Signed)
Just getting over head cold, husband had walking pneumonia but this has now cleared.

## 2014-07-05 NOTE — Progress Notes (Signed)
Doing well Husband with walking pneumonia--she is concerned but has no fever, just URI and somewhat persistent cough-OTC meds ok

## 2014-07-18 ENCOUNTER — Encounter: Payer: Self-pay | Admitting: Obstetrics & Gynecology

## 2014-07-18 ENCOUNTER — Ambulatory Visit (INDEPENDENT_AMBULATORY_CARE_PROVIDER_SITE_OTHER): Payer: Medicaid Other | Admitting: Obstetrics & Gynecology

## 2014-07-18 VITALS — BP 128/80 | HR 101

## 2014-07-18 DIAGNOSIS — O2243 Hemorrhoids in pregnancy, third trimester: Secondary | ICD-10-CM

## 2014-07-18 DIAGNOSIS — Z3493 Encounter for supervision of normal pregnancy, unspecified, third trimester: Secondary | ICD-10-CM

## 2014-07-18 NOTE — Progress Notes (Signed)
Routine visit. Good FM. No problems except for hemorrhoids. Cultures at next visit

## 2014-07-24 ENCOUNTER — Encounter: Payer: Self-pay | Admitting: Obstetrics & Gynecology

## 2014-07-24 ENCOUNTER — Ambulatory Visit (INDEPENDENT_AMBULATORY_CARE_PROVIDER_SITE_OTHER): Payer: Medicaid Other | Admitting: Obstetrics & Gynecology

## 2014-07-24 VITALS — BP 104/66 | HR 90

## 2014-07-24 DIAGNOSIS — Z3493 Encounter for supervision of normal pregnancy, unspecified, third trimester: Secondary | ICD-10-CM

## 2014-07-24 NOTE — Patient Instructions (Signed)
Return to clinic for any obstetric concerns or go to [facility] for evaluation  

## 2014-07-24 NOTE — Progress Notes (Signed)
Pelvic cultures next week.  No other complaints or concerns.  Labor and fetal movement precautions reviewed.

## 2014-08-02 ENCOUNTER — Ambulatory Visit (INDEPENDENT_AMBULATORY_CARE_PROVIDER_SITE_OTHER): Payer: Medicaid Other | Admitting: Obstetrics and Gynecology

## 2014-08-02 VITALS — BP 121/77 | HR 82 | Wt 222.0 lb

## 2014-08-02 DIAGNOSIS — Z36 Encounter for antenatal screening of mother: Secondary | ICD-10-CM

## 2014-08-02 DIAGNOSIS — Z3493 Encounter for supervision of normal pregnancy, unspecified, third trimester: Secondary | ICD-10-CM

## 2014-08-02 LAB — OB RESULTS CONSOLE GBS: GBS: NEGATIVE

## 2014-08-02 LAB — OB RESULTS CONSOLE GC/CHLAMYDIA
Chlamydia: NEGATIVE
GC PROBE AMP, GENITAL: NEGATIVE

## 2014-08-02 NOTE — Patient Instructions (Signed)
Third Trimester of Pregnancy The third trimester is from week 29 through week 42, months 7 through 9. The third trimester is a time when the fetus is growing rapidly. At the end of the ninth month, the fetus is about 20 inches in length and weighs 6-10 pounds.  BODY CHANGES Your body goes through many changes during pregnancy. The changes vary from woman to woman.   Your weight will continue to increase. You can expect to gain 25-35 pounds (11-16 kg) by the end of the pregnancy.  You may begin to get stretch marks on your hips, abdomen, and breasts.  You may urinate more often because the fetus is moving lower into your pelvis and pressing on your bladder.  You may develop or continue to have heartburn as a result of your pregnancy.  You may develop constipation because certain hormones are causing the muscles that push waste through your intestines to slow down.  You may develop hemorrhoids or swollen, bulging veins (varicose veins).  You may have pelvic pain because of the weight gain and pregnancy hormones relaxing your joints between the bones in your pelvis. Backaches may result from overexertion of the muscles supporting your posture.  You may have changes in your hair. These can include thickening of your hair, rapid growth, and changes in texture. Some women also have hair loss during or after pregnancy, or hair that feels dry or thin. Your hair will most likely return to normal after your baby is born.  Your breasts will continue to grow and be tender. A yellow discharge may leak from your breasts called colostrum.  Your belly button may stick out.  You may feel short of breath because of your expanding uterus.  You may notice the fetus "dropping," or moving lower in your abdomen.  You may have a bloody mucus discharge. This usually occurs a few days to a week before labor begins.  Your cervix becomes thin and soft (effaced) near your due date. WHAT TO EXPECT AT YOUR PRENATAL  EXAMS  You will have prenatal exams every 2 weeks until week 36. Then, you will have weekly prenatal exams. During a routine prenatal visit:  You will be weighed to make sure you and the fetus are growing normally.  Your blood pressure is taken.  Your abdomen will be measured to track your baby's growth.  The fetal heartbeat will be listened to.  Any test results from the previous visit will be discussed.  You may have a cervical check near your due date to see if you have effaced. At around 36 weeks, your caregiver will check your cervix. At the same time, your caregiver will also perform a test on the secretions of the vaginal tissue. This test is to determine if a type of bacteria, Group B streptococcus, is present. Your caregiver will explain this further. Your caregiver may ask you:  What your birth plan is.  How you are feeling.  If you are feeling the baby move.  If you have had any abnormal symptoms, such as leaking fluid, bleeding, severe headaches, or abdominal cramping.  If you have any questions. Other tests or screenings that may be performed during your third trimester include:  Blood tests that check for low iron levels (anemia).  Fetal testing to check the health, activity level, and growth of the fetus. Testing is done if you have certain medical conditions or if there are problems during the pregnancy. FALSE LABOR You may feel small, irregular contractions that   eventually go away. These are called Braxton Hicks contractions, or false labor. Contractions may last for hours, days, or even weeks before true labor sets in. If contractions come at regular intervals, intensify, or become painful, it is best to be seen by your caregiver.  SIGNS OF LABOR   Menstrual-like cramps.  Contractions that are 5 minutes apart or less.  Contractions that start on the top of the uterus and spread down to the lower abdomen and back.  A sense of increased pelvic pressure or back  pain.  A watery or bloody mucus discharge that comes from the vagina. If you have any of these signs before the 37th week of pregnancy, call your caregiver right away. You need to go to the hospital to get checked immediately. HOME CARE INSTRUCTIONS   Avoid all smoking, herbs, alcohol, and unprescribed drugs. These chemicals affect the formation and growth of the baby.  Follow your caregiver's instructions regarding medicine use. There are medicines that are either safe or unsafe to take during pregnancy.  Exercise only as directed by your caregiver. Experiencing uterine cramps is a good sign to stop exercising.  Continue to eat regular, healthy meals.  Wear a good support bra for breast tenderness.  Do not use hot tubs, steam rooms, or saunas.  Wear your seat belt at all times when driving.  Avoid raw meat, uncooked cheese, cat litter boxes, and soil used by cats. These carry germs that can cause birth defects in the baby.  Take your prenatal vitamins.  Try taking a stool softener (if your caregiver approves) if you develop constipation. Eat more high-fiber foods, such as fresh vegetables or fruit and whole grains. Drink plenty of fluids to keep your urine clear or pale yellow.  Take warm sitz baths to soothe any pain or discomfort caused by hemorrhoids. Use hemorrhoid cream if your caregiver approves.  If you develop varicose veins, wear support hose. Elevate your feet for 15 minutes, 3-4 times a day. Limit salt in your diet.  Avoid heavy lifting, wear low heal shoes, and practice good posture.  Rest a lot with your legs elevated if you have leg cramps or low back pain.  Visit your dentist if you have not gone during your pregnancy. Use a soft toothbrush to brush your teeth and be gentle when you floss.  A sexual relationship may be continued unless your caregiver directs you otherwise.  Do not travel far distances unless it is absolutely necessary and only with the approval  of your caregiver.  Take prenatal classes to understand, practice, and ask questions about the labor and delivery.  Make a trial run to the hospital.  Pack your hospital bag.  Prepare the baby's nursery.  Continue to go to all your prenatal visits as directed by your caregiver. SEEK MEDICAL CARE IF:  You are unsure if you are in labor or if your water has broken.  You have dizziness.  You have mild pelvic cramps, pelvic pressure, or nagging pain in your abdominal area.  You have persistent nausea, vomiting, or diarrhea.  You have a bad smelling vaginal discharge.  You have pain with urination. SEEK IMMEDIATE MEDICAL CARE IF:   You have a fever.  You are leaking fluid from your vagina.  You have spotting or bleeding from your vagina.  You have severe abdominal cramping or pain.  You have rapid weight loss or gain.  You have shortness of breath with chest pain.  You notice sudden or extreme swelling   of your face, hands, ankles, feet, or legs.  You have not felt your baby move in over an hour.  You have severe headaches that do not go away with medicine.  You have vision changes. Document Released: 03/25/2001 Document Revised: 04/05/2013 Document Reviewed: 06/01/2012 Exit[employer] Patient Information 2015 [employer]. This information is not intended to replace advice given to you by your health care provider. Make sure you discuss any questions you have with your health care provider.  

## 2014-08-02 NOTE — Addendum Note (Signed)
Addended by: Tandy GawHINTON, Angelic Schnelle C on: 08/02/2014 03:40 PM   Modules accepted: Orders

## 2014-08-02 NOTE — Progress Notes (Signed)
Doing pretty well. Using splint with some relief of right CTS. Also having late gestational M-S discomforts. Cultures done. Labor and FM precautions. Cultures done.

## 2014-08-03 LAB — GC/CHLAMYDIA PROBE AMP
CT PROBE, AMP APTIMA: NEGATIVE
GC PROBE AMP APTIMA: NEGATIVE

## 2014-08-04 LAB — CULTURE, BETA STREP (GROUP B ONLY)

## 2014-08-08 ENCOUNTER — Ambulatory Visit (INDEPENDENT_AMBULATORY_CARE_PROVIDER_SITE_OTHER): Payer: Medicaid Other | Admitting: Obstetrics & Gynecology

## 2014-08-08 VITALS — BP 104/71 | HR 98 | Wt 221.0 lb

## 2014-08-08 DIAGNOSIS — Z3493 Encounter for supervision of normal pregnancy, unspecified, third trimester: Secondary | ICD-10-CM

## 2014-08-08 NOTE — Progress Notes (Signed)
Routine visit. Good FM. No problems. Labor precautions reviewed. She requests a cervical exam.

## 2014-08-15 ENCOUNTER — Ambulatory Visit (INDEPENDENT_AMBULATORY_CARE_PROVIDER_SITE_OTHER): Payer: Medicaid Other | Admitting: Obstetrics & Gynecology

## 2014-08-15 VITALS — BP 99/67 | HR 80 | Wt 223.2 lb

## 2014-08-15 DIAGNOSIS — Z3490 Encounter for supervision of normal pregnancy, unspecified, unspecified trimester: Secondary | ICD-10-CM

## 2014-08-15 NOTE — Progress Notes (Signed)
Routine visit. Good FM. No problems. Labor precautions reviewed again.

## 2014-08-22 ENCOUNTER — Ambulatory Visit (INDEPENDENT_AMBULATORY_CARE_PROVIDER_SITE_OTHER): Payer: Medicaid Other | Admitting: Family Medicine

## 2014-08-22 VITALS — BP 106/72 | HR 88 | Wt 223.0 lb

## 2014-08-22 DIAGNOSIS — Z3493 Encounter for supervision of normal pregnancy, unspecified, third trimester: Secondary | ICD-10-CM

## 2014-08-22 NOTE — Progress Notes (Signed)
Reports BH contractions Reports some rhythmic movement that is not hiccups Membranes stripped

## 2014-08-22 NOTE — Patient Instructions (Signed)
Third Trimester of Pregnancy The third trimester is from week 29 through week 42, months 7 through 9. The third trimester is a time when the fetus is growing rapidly. At the end of the ninth month, the fetus is about 20 inches in length and weighs 6-10 pounds.  BODY CHANGES Your body goes through many changes during pregnancy. The changes vary from woman to woman.   Your weight will continue to increase. You can expect to gain 25-35 pounds (11-16 kg) by the end of the pregnancy.  You may begin to get stretch marks on your hips, abdomen, and breasts.  You may urinate more often because the fetus is moving lower into your pelvis and pressing on your bladder.  You may develop or continue to have heartburn as a result of your pregnancy.  You may develop constipation because certain hormones are causing the muscles that push waste through your intestines to slow down.  You may develop hemorrhoids or swollen, bulging veins (varicose veins).  You may have pelvic pain because of the weight gain and pregnancy hormones relaxing your joints between the bones in your pelvis. Backaches may result from overexertion of the muscles supporting your posture.  You may have changes in your hair. These can include thickening of your hair, rapid growth, and changes in texture. Some women also have hair loss during or after pregnancy, or hair that feels dry or thin. Your hair will most likely return to normal after your baby is born.  Your breasts will continue to grow and be tender. A yellow discharge may leak from your breasts called colostrum.  Your belly button may stick out.  You may feel short of breath because of your expanding uterus.  You may notice the fetus "dropping," or moving lower in your abdomen.  You may have a bloody mucus discharge. This usually occurs a few days to a week before labor begins.  Your cervix becomes thin and soft (effaced) near your due date. WHAT TO EXPECT AT YOUR  PRENATAL EXAMS  You will have prenatal exams every 2 weeks until week 36. Then, you will have weekly prenatal exams. During a routine prenatal visit:  You will be weighed to make sure you and the fetus are growing normally.  Your blood pressure is taken.  Your abdomen will be measured to track your baby's growth.  The fetal heartbeat will be listened to.  Any test results from the previous visit will be discussed.  You may have a cervical check near your due date to see if you have effaced. At around 36 weeks, your caregiver will check your cervix. At the same time, your caregiver will also perform a test on the secretions of the vaginal tissue. This test is to determine if a type of bacteria, Group B streptococcus, is present. Your caregiver will explain this further. Your caregiver may ask you:  What your birth plan is.  How you are feeling.  If you are feeling the baby move.  If you have had any abnormal symptoms, such as leaking fluid, bleeding, severe headaches, or abdominal cramping.  If you have any questions. Other tests or screenings that may be performed during your third trimester include:  Blood tests that check for low iron levels (anemia).  Fetal testing to check the health, activity level, and growth of the fetus. Testing is done if you have certain medical conditions or if there are problems during the pregnancy. FALSE LABOR You may feel small, irregular contractions that   eventually go away. These are called Braxton Hicks contractions, or false labor. Contractions may last for hours, days, or even weeks before true labor sets in. If contractions come at regular intervals, intensify, or become painful, it is best to be seen by your caregiver.  SIGNS OF LABOR   Menstrual-like cramps.  Contractions that are 5 minutes apart or less.  Contractions that start on the top of the uterus and spread down to the lower abdomen and back.  A sense of increased pelvic  pressure or back pain.  A watery or bloody mucus discharge that comes from the vagina. If you have any of these signs before the 37th week of pregnancy, call your caregiver right away. You need to go to the hospital to get checked immediately. HOME CARE INSTRUCTIONS   Avoid all smoking, herbs, alcohol, and unprescribed drugs. These chemicals affect the formation and growth of the baby.  Follow your caregiver's instructions regarding medicine use. There are medicines that are either safe or unsafe to take during pregnancy.  Exercise only as directed by your caregiver. Experiencing uterine cramps is a good sign to stop exercising.  Continue to eat regular, healthy meals.  Wear a good support bra for breast tenderness.  Do not use hot tubs, steam rooms, or saunas.  Wear your seat belt at all times when driving.  Avoid raw meat, uncooked cheese, cat litter boxes, and soil used by cats. These carry germs that can cause birth defects in the baby.  Take your prenatal vitamins.  Try taking a stool softener (if your caregiver approves) if you develop constipation. Eat more high-fiber foods, such as fresh vegetables or fruit and whole grains. Drink plenty of fluids to keep your urine clear or pale yellow.  Take warm sitz baths to soothe any pain or discomfort caused by hemorrhoids. Use hemorrhoid cream if your caregiver approves.  If you develop varicose veins, wear support hose. Elevate your feet for 15 minutes, 3-4 times a day. Limit salt in your diet.  Avoid heavy lifting, wear low heal shoes, and practice good posture.  Rest a lot with your legs elevated if you have leg cramps or low back pain.  Visit your dentist if you have not gone during your pregnancy. Use a soft toothbrush to brush your teeth and be gentle when you floss.  A sexual relationship may be continued unless your caregiver directs you otherwise.  Do not travel far distances unless it is absolutely necessary and only  with the approval of your caregiver.  Take prenatal classes to understand, practice, and ask questions about the labor and delivery.  Make a trial run to the hospital.  Pack your hospital bag.  Prepare the baby's nursery.  Continue to go to all your prenatal visits as directed by your caregiver. SEEK MEDICAL CARE IF:  You are unsure if you are in labor or if your water has broken.  You have dizziness.  You have mild pelvic cramps, pelvic pressure, or nagging pain in your abdominal area.  You have persistent nausea, vomiting, or diarrhea.  You have a bad smelling vaginal discharge.  You have pain with urination. SEEK IMMEDIATE MEDICAL CARE IF:   You have a fever.  You are leaking fluid from your vagina.  You have spotting or bleeding from your vagina.  You have severe abdominal cramping or pain.  You have rapid weight loss or gain.  You have shortness of breath with chest pain.  You notice sudden or extreme swelling   of your face, hands, ankles, feet, or legs.  You have not felt your baby move in over an hour.  You have severe headaches that do not go away with medicine.  You have vision changes. Document Released: 03/25/2001 Document Revised: 04/05/2013 Document Reviewed: 06/01/2012 [employer] Patient Information 2015 [employer]. This information is not intended to replace advice given to you by your health care provider. Make sure you discuss any questions you have with your health care provider.  Breastfeeding Deciding to breastfeed is one of the best choices you can make for you and your baby. A change in hormones during pregnancy causes your breast tissue to grow and increases the number and size of your milk ducts. These hormones also allow proteins, sugars, and fats from your blood supply to make breast milk in your milk-producing glands. Hormones prevent breast milk from being released before your baby is born as well as prompt milk flow after birth. Once  breastfeeding has begun, thoughts of your baby, as well as his or her sucking or crying, can stimulate the release of milk from your milk-producing glands.  BENEFITS OF BREASTFEEDING For Your Baby  Your first milk (colostrum) helps your baby's digestive system function better.   There are antibodies in your milk that help your baby fight off infections.   Your baby has a lower incidence of asthma, allergies, and sudden infant death syndrome.   The nutrients in breast milk are better for your baby than infant formulas and are designed uniquely for your baby's needs.   Breast milk improves your baby's brain development.   Your baby is less likely to develop other conditions, such as childhood obesity, asthma, or type 2 diabetes mellitus.  For You   Breastfeeding helps to create a very special bond between you and your baby.   Breastfeeding is convenient. Breast milk is always available at the correct temperature and costs nothing.   Breastfeeding helps to burn calories and helps you lose the weight gained during pregnancy.   Breastfeeding makes your uterus contract to its prepregnancy size faster and slows bleeding (lochia) after you give birth.   Breastfeeding helps to lower your risk of developing type 2 diabetes mellitus, osteoporosis, and breast or ovarian cancer later in life. SIGNS THAT YOUR BABY IS HUNGRY Early Signs of Hunger  Increased alertness or activity.  Stretching.  Movement of the head from side to side.  Movement of the head and opening of the mouth when the corner of the mouth or cheek is stroked (rooting).  Increased sucking sounds, smacking lips, cooing, sighing, or squeaking.  Hand-to-mouth movements.  Increased sucking of fingers or hands. Late Signs of Hunger  Fussing.  Intermittent crying. Extreme Signs of Hunger Signs of extreme hunger will require calming and consoling before your baby will be able to breastfeed successfully. Do not  wait for the following signs of extreme hunger to occur before you initiate breastfeeding:   Restlessness.  A loud, strong cry.   Screaming. BREASTFEEDING BASICS Breastfeeding Initiation  Find a comfortable place to sit or lie down, with your neck and back well supported.  Place a pillow or rolled up blanket under your baby to bring him or her to the level of your breast (if you are seated). Nursing pillows are specially designed to help support your arms and your baby while you breastfeed.  Make sure that your baby's abdomen is facing your abdomen.   Gently massage your breast. With your fingertips, massage from your chest   wall toward your nipple in a circular motion. This encourages milk flow. You may need to continue this action during the feeding if your milk flows slowly.  Support your breast with 4 fingers underneath and your thumb above your nipple. Make sure your fingers are well away from your nipple and your baby's mouth.   Stroke your baby's lips gently with your finger or nipple.   When your baby's mouth is open wide enough, quickly bring your baby to your breast, placing your entire nipple and as much of the colored area around your nipple (areola) as possible into your baby's mouth.   More areola should be visible above your baby's upper lip than below the lower lip.   Your baby's tongue should be between his or her lower gum and your breast.   Ensure that your baby's mouth is correctly positioned around your nipple (latched). Your baby's lips should create a seal on your breast and be turned out (everted).  It is common for your baby to suck about 2-3 minutes in order to start the flow of breast milk. Latching Teaching your baby how to latch on to your breast properly is very important. An improper latch can cause nipple pain and decreased milk supply for you and poor weight gain in your baby. Also, if your baby is not latched onto your nipple properly, he or she  may swallow some air during feeding. This can make your baby fussy. Burping your baby when you switch breasts during the feeding can help to get rid of the air. However, teaching your baby to latch on properly is still the best way to prevent fussiness from swallowing air while breastfeeding. Signs that your baby has successfully latched on to your nipple:    Silent tugging or silent sucking, without causing you pain.   Swallowing heard between every 3-4 sucks.    Muscle movement above and in front of his or her ears while sucking.  Signs that your baby has not successfully latched on to nipple:   Sucking sounds or smacking sounds from your baby while breastfeeding.  Nipple pain. If you think your baby has not latched on correctly, slip your finger into the corner of your baby's mouth to break the suction and place it between your baby's gums. Attempt breastfeeding initiation again. Signs of Successful Breastfeeding Signs from your baby:   A gradual decrease in the number of sucks or complete cessation of sucking.   Falling asleep.   Relaxation of his or her body.   Retention of a small amount of milk in his or her mouth.   Letting go of your breast by himself or herself. Signs from you:  Breasts that have increased in firmness, weight, and size 1-3 hours after feeding.   Breasts that are softer immediately after breastfeeding.  Increased milk volume, as well as a change in milk consistency and color by the fifth day of breastfeeding.   Nipples that are not sore, cracked, or bleeding. Signs That Your Baby is Getting Enough Milk  Wetting at least 3 diapers in a 24-hour period. The urine should be clear and pale yellow by age 5 days.  At least 3 stools in a 24-hour period by age 5 days. The stool should be soft and yellow.  At least 3 stools in a 24-hour period by age 7 days. The stool should be seedy and yellow.  No loss of weight greater than 10% of birth weight  during the first 3   days of age.  Average weight gain of 4-7 ounces (113-198 g) per week after age 4 days.  Consistent daily weight gain by age 5 days, without weight loss after the age of 2 weeks. After a feeding, your baby may spit up a small amount. This is common. BREASTFEEDING FREQUENCY AND DURATION Frequent feeding will help you make more milk and can prevent sore nipples and breast engorgement. Breastfeed when you feel the need to reduce the fullness of your breasts or when your baby shows signs of hunger. This is called "breastfeeding on demand." Avoid introducing a pacifier to your baby while you are working to establish breastfeeding (the first 4-6 weeks after your baby is born). After this time you may choose to use a pacifier. Research has shown that pacifier use during the first year of a baby's life decreases the risk of sudden infant death syndrome (SIDS). Allow your baby to feed on each breast as long as he or she wants. Breastfeed until your baby is finished feeding. When your baby unlatches or falls asleep while feeding from the first breast, offer the second breast. Because newborns are often sleepy in the first few weeks of life, you may need to awaken your baby to get him or her to feed. Breastfeeding times will vary from baby to baby. However, the following rules can serve as a guide to help you ensure that your baby is properly fed:  Newborns (babies 4 weeks of age or younger) may breastfeed every 1-3 hours.  Newborns should not go longer than 3 hours during the day or 5 hours during the night without breastfeeding.  You should breastfeed your baby a minimum of 8 times in a 24-hour period until you begin to introduce solid foods to your baby at around 6 months of age. BREAST MILK PUMPING Pumping and storing breast milk allows you to ensure that your baby is exclusively fed your breast milk, even at times when you are unable to breastfeed. This is especially important if you are  going back to work while you are still breastfeeding or when you are not able to be present during feedings. Your lactation consultant can give you guidelines on how long it is safe to store breast milk.  A breast pump is a machine that allows you to pump milk from your breast into a sterile bottle. The pumped breast milk can then be stored in a refrigerator or freezer. Some breast pumps are operated by hand, while others use electricity. Ask your lactation consultant which type will work best for you. Breast pumps can be purchased, but some hospitals and breastfeeding support groups lease breast pumps on a monthly basis. A lactation consultant can teach you how to hand express breast milk, if you prefer not to use a pump.  CARING FOR YOUR BREASTS WHILE YOU BREASTFEED Nipples can become dry, cracked, and sore while breastfeeding. The following recommendations can help keep your breasts moisturized and healthy:  Avoid using soap on your nipples.   Wear a supportive bra. Although not required, special nursing bras and tank tops are designed to allow access to your breasts for breastfeeding without taking off your entire bra or top. Avoid wearing underwire-style bras or extremely tight bras.  Air dry your nipples for 3-4minutes after each feeding.   Use only cotton bra pads to absorb leaked breast milk. Leaking of breast milk between feedings is normal.   Use lanolin on your nipples after breastfeeding. Lanolin helps to maintain your skin's   normal moisture barrier. If you use pure lanolin, you do not need to wash it off before feeding your baby again. Pure lanolin is not toxic to your baby. You may also hand express a few drops of breast milk and gently massage that milk into your nipples and allow the milk to air dry. In the first few weeks after giving birth, some women experience extremely full breasts (engorgement). Engorgement can make your breasts feel heavy, warm, and tender to the touch.  Engorgement peaks within 3-5 days after you give birth. The following recommendations can help ease engorgement:  Completely empty your breasts while breastfeeding or pumping. You may want to start by applying warm, moist heat (in the shower or with warm water-soaked hand towels) just before feeding or pumping. This increases circulation and helps the milk flow. If your baby does not completely empty your breasts while breastfeeding, pump any extra milk after he or she is finished.  Wear a snug bra (nursing or regular) or tank top for 1-2 days to signal your body to slightly decrease milk production.  Apply ice packs to your breasts, unless this is too uncomfortable for you.  Make sure that your baby is latched on and positioned properly while breastfeeding. If engorgement persists after 48 hours of following these recommendations, contact your health care provider or a lactation consultant. OVERALL HEALTH CARE RECOMMENDATIONS WHILE BREASTFEEDING  Eat healthy foods. Alternate between meals and snacks, eating 3 of each per day. Because what you eat affects your breast milk, some of the foods may make your baby more irritable than usual. Avoid eating these foods if you are sure that they are negatively affecting your baby.  Drink milk, fruit juice, and water to satisfy your thirst (about 10 glasses a day).   Rest often, relax, and continue to take your prenatal vitamins to prevent fatigue, stress, and anemia.  Continue breast self-awareness checks.  Avoid chewing and smoking tobacco.  Avoid alcohol and drug use. Some medicines that may be harmful to your baby can pass through breast milk. It is important to ask your health care provider before taking any medicine, including all over-the-counter and prescription medicine as well as vitamin and herbal supplements. It is possible to become pregnant while breastfeeding. If birth control is desired, ask your health care provider about options that  will be safe for your baby. SEEK MEDICAL CARE IF:   You feel like you want to stop breastfeeding or have become frustrated with breastfeeding.  You have painful breasts or nipples.  Your nipples are cracked or bleeding.  Your breasts are red, tender, or warm.  You have a swollen area on either breast.  You have a fever or chills.  You have nausea or vomiting.  You have drainage other than breast milk from your nipples.  Your breasts do not become full before feedings by the fifth day after you give birth.  You feel sad and depressed.  Your baby is too sleepy to eat well.  Your baby is having trouble sleeping.   Your baby is wetting less than 3 diapers in a 24-hour period.  Your baby has less than 3 stools in a 24-hour period.  Your baby's skin or the white part of his or her eyes becomes yellow.   Your baby is not gaining weight by 5 days of age. SEEK IMMEDIATE MEDICAL CARE IF:   Your baby is overly tired (lethargic) and does not want to wake up and feed.  Your baby   develops an unexplained fever. Document Released: 03/31/2005 Document Revised: 04/05/2013 Document Reviewed: 09/22/2012 [employer] Patient Information 2015 [employer]. This information is not intended to replace advice given to you by your health care provider. Make sure you discuss any questions you have with your health care provider.  

## 2014-08-24 IMAGING — CR DG WRIST COMPLETE 3+V*L*
3 series · 3 of 3 positions shown · non-contrast
Comparison: None.

CLINICAL DATA: Tingling in the left index finger

EXAM:
LEFT WRIST - COMPLETE 3+ VIEW

[view not recorded (1 of 3)]
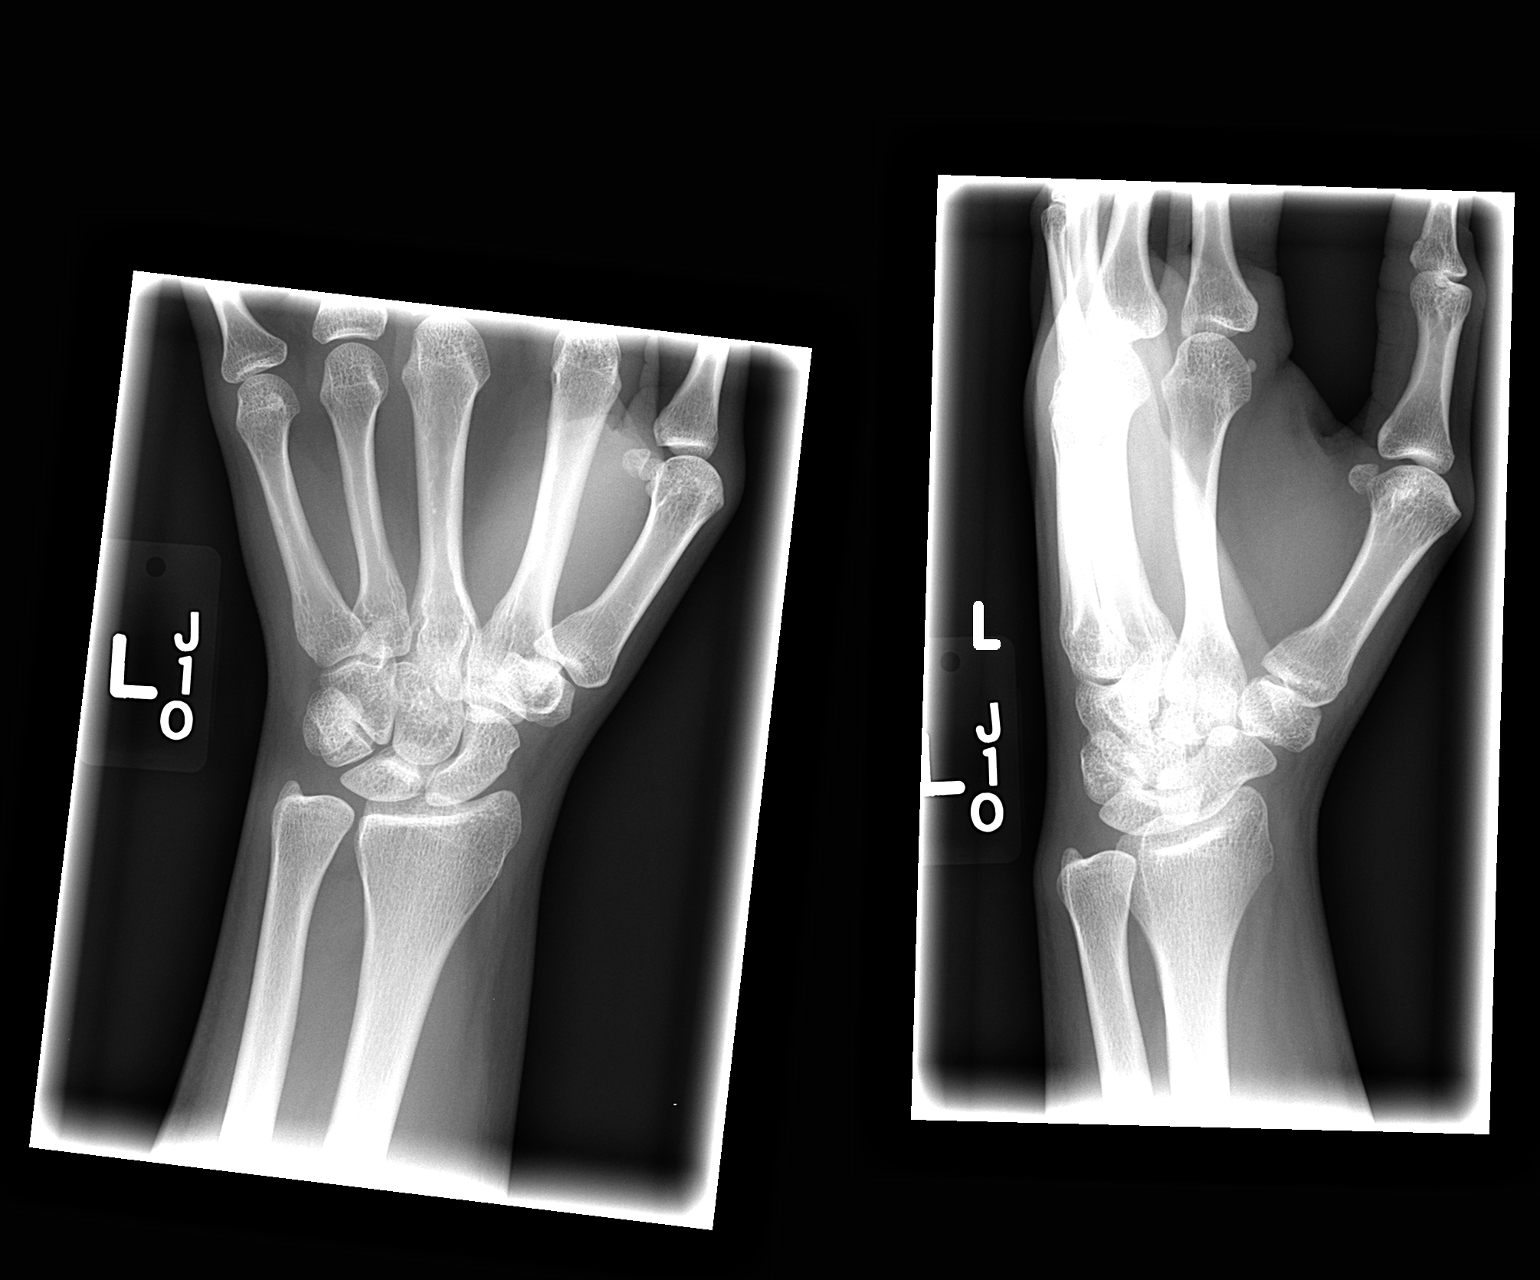

[view not recorded (2 of 3)]
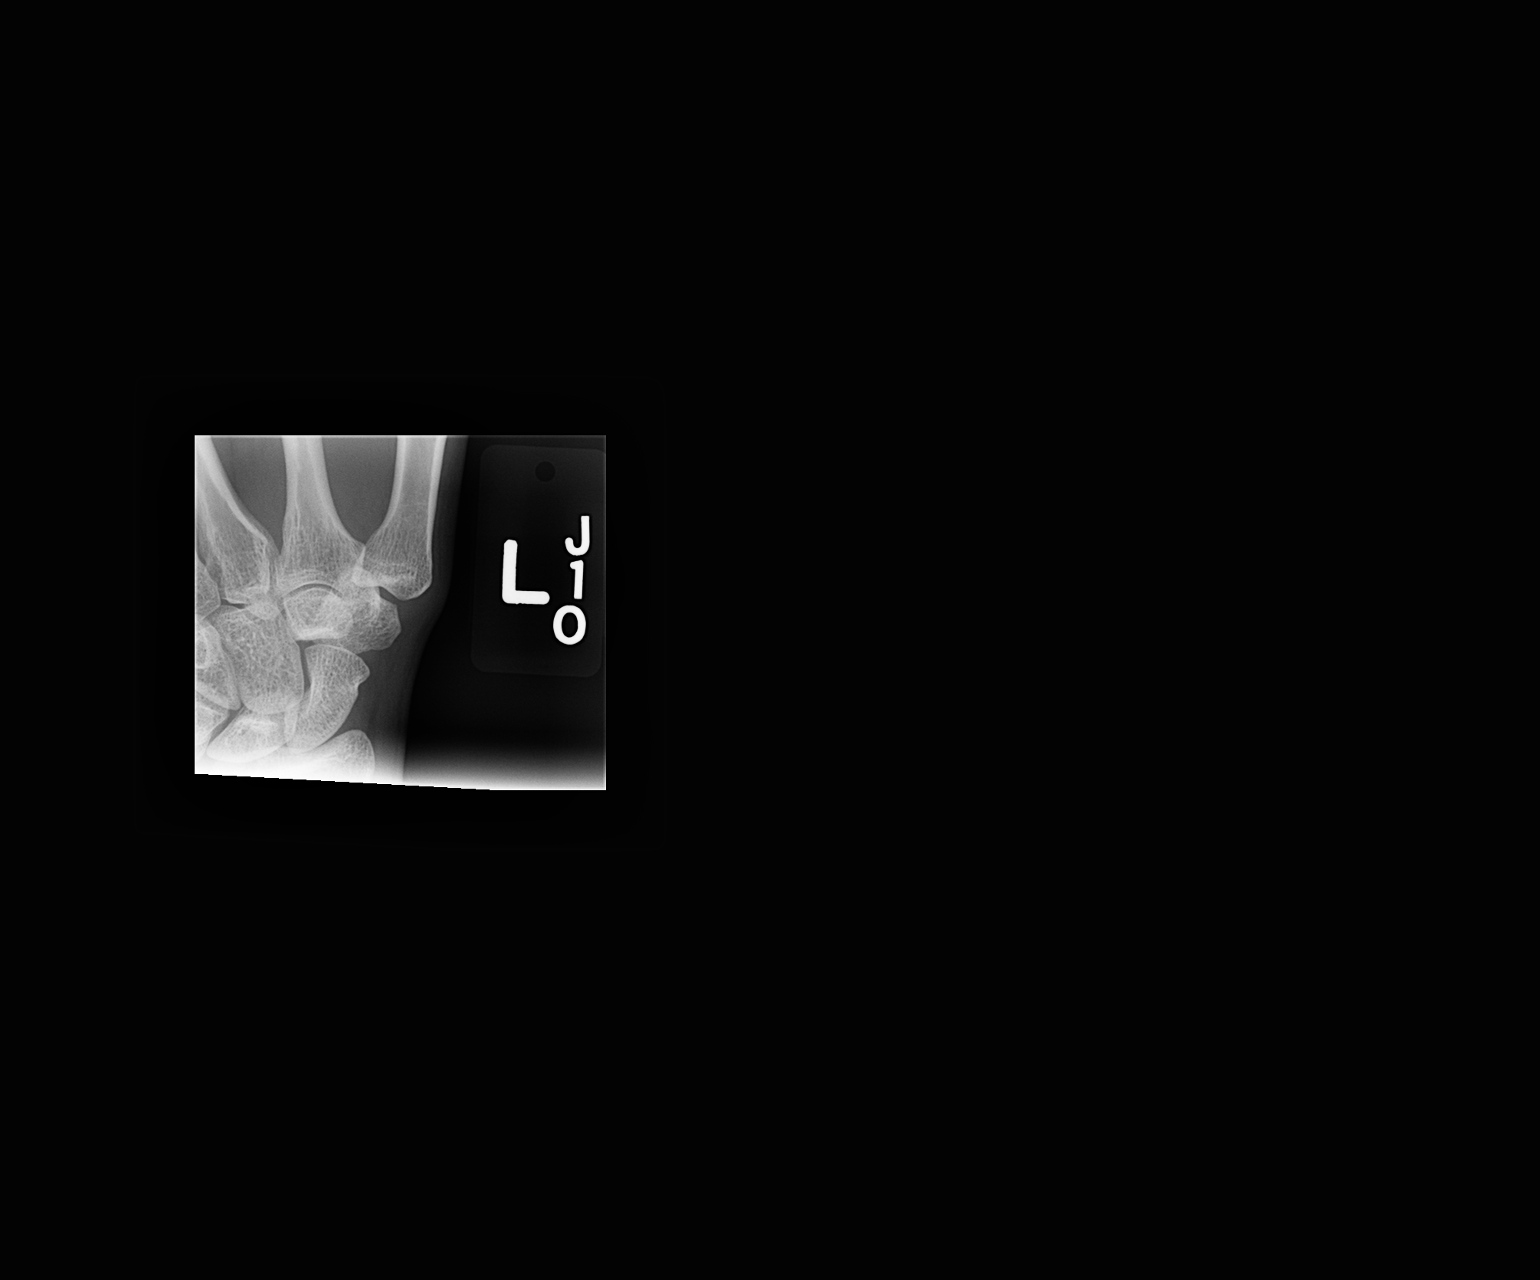

[view not recorded (3 of 3)]
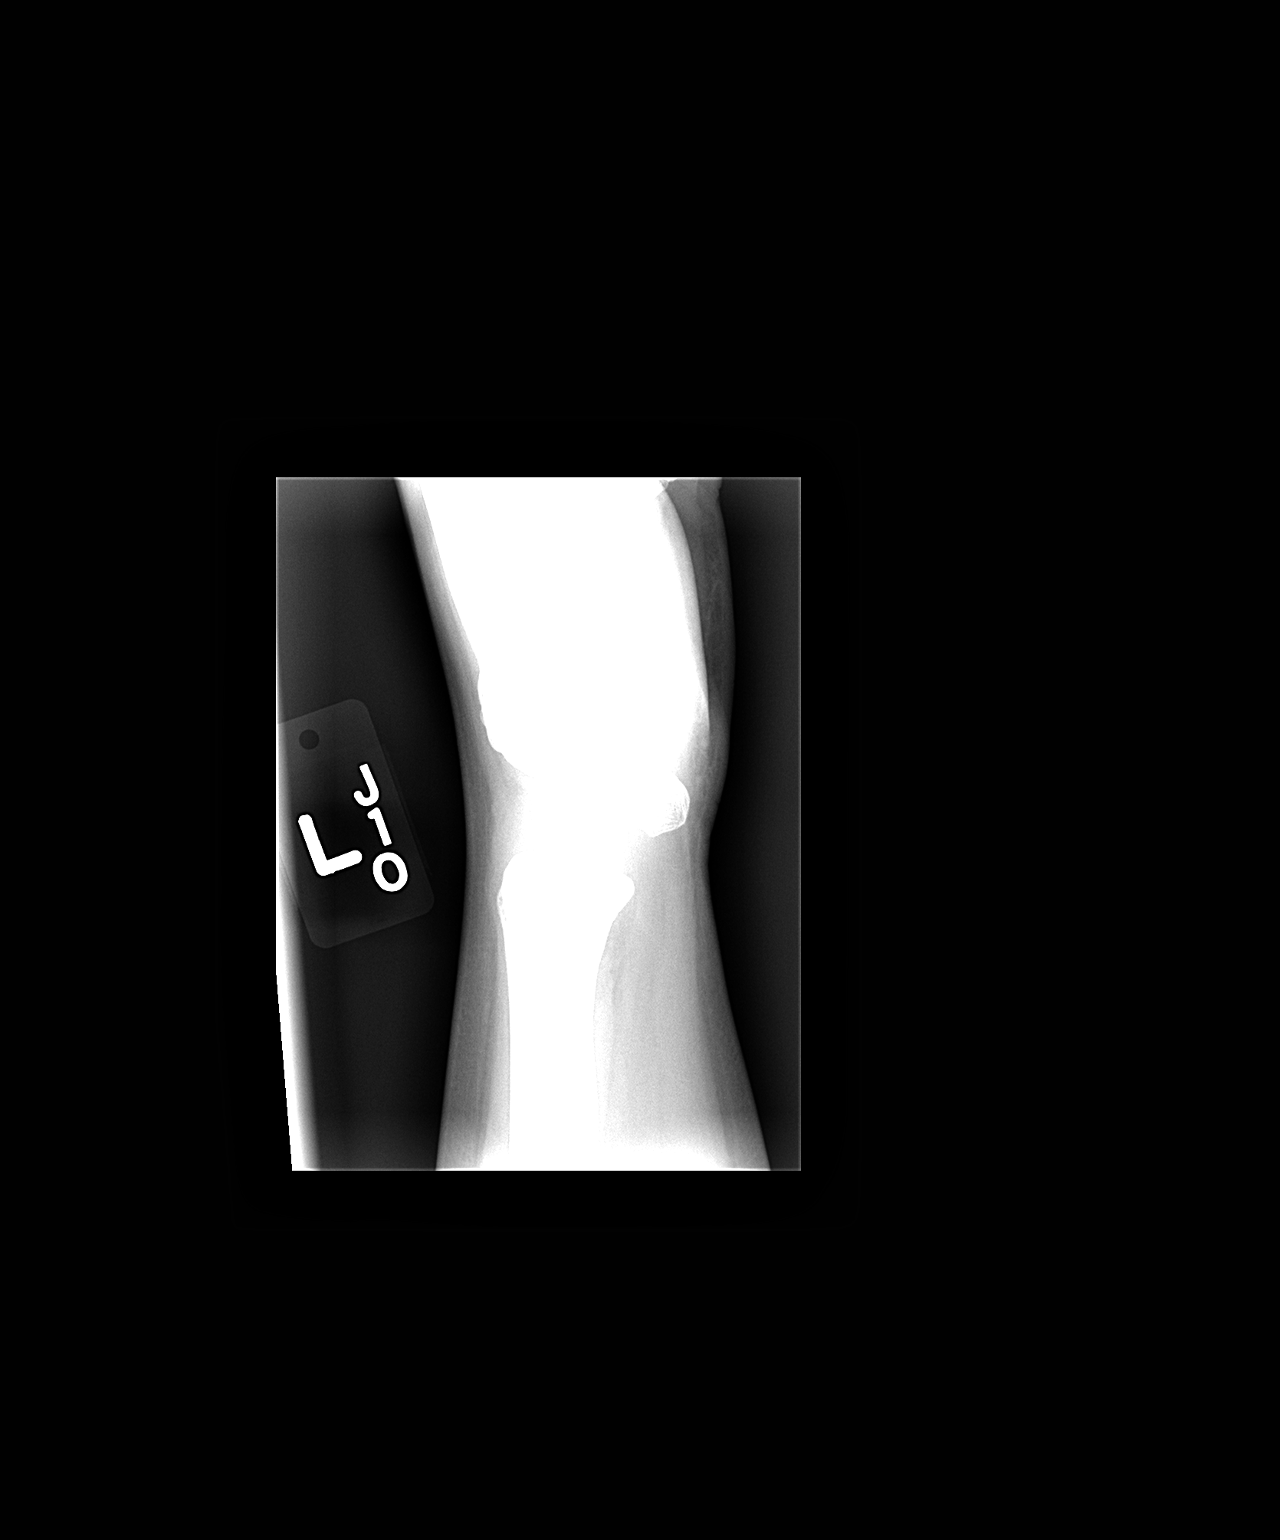

[3 of 3 positions shown; findings below may reference images not displayed]

FINDINGS: There is no evidence of fracture or dislocation. There is no
evidence of arthropathy or other focal bone abnormality. Soft
tissues are unremarkable.
IMPRESSION: Negative.

## 2014-08-25 ENCOUNTER — Encounter (HOSPITAL_COMMUNITY): Payer: Self-pay | Admitting: *Deleted

## 2014-08-25 ENCOUNTER — Telehealth (HOSPITAL_COMMUNITY): Payer: Self-pay | Admitting: *Deleted

## 2014-08-25 NOTE — Telephone Encounter (Signed)
Preadmission screen  

## 2014-08-29 ENCOUNTER — Ambulatory Visit (INDEPENDENT_AMBULATORY_CARE_PROVIDER_SITE_OTHER): Payer: Medicaid Other | Admitting: Obstetrics & Gynecology

## 2014-08-29 ENCOUNTER — Encounter: Payer: Self-pay | Admitting: Obstetrics & Gynecology

## 2014-08-29 VITALS — BP 99/66 | HR 67 | Wt 224.6 lb

## 2014-08-29 DIAGNOSIS — Z3493 Encounter for supervision of normal pregnancy, unspecified, third trimester: Secondary | ICD-10-CM

## 2014-08-29 DIAGNOSIS — O480 Post-term pregnancy: Secondary | ICD-10-CM | POA: Diagnosis not present

## 2014-08-29 NOTE — Patient Instructions (Signed)
Return to clinic for any obstetric concerns or go to [facility] for evaluation  

## 2014-08-29 NOTE — Progress Notes (Signed)
NST performed today was reviewed and was found to be reactive.  AFI normal at 15.8 cm.  Cephalic presentation. IOL scheduled on 09/02/14.    Labor and fetal movement precautions reviewed. Postpartum visit scheduled.

## 2014-08-31 ENCOUNTER — Encounter (HOSPITAL_COMMUNITY): Payer: Self-pay | Admitting: *Deleted

## 2014-08-31 ENCOUNTER — Inpatient Hospital Stay (HOSPITAL_COMMUNITY)
Admission: AD | Admit: 2014-08-31 | Discharge: 2014-09-02 | DRG: 775 | Disposition: A | Discharge status: Home / Self Care | Payer: Medicaid Other | Source: Ambulatory Visit | Attending: Obstetrics & Gynecology | Admitting: Obstetrics & Gynecology

## 2014-08-31 ENCOUNTER — Inpatient Hospital Stay (HOSPITAL_COMMUNITY): Payer: Medicaid Other | Admitting: Anesthesiology

## 2014-08-31 DIAGNOSIS — IMO0001 Reserved for inherently not codable concepts without codable children: Secondary | ICD-10-CM

## 2014-08-31 DIAGNOSIS — O480 Post-term pregnancy: Secondary | ICD-10-CM | POA: Diagnosis present

## 2014-08-31 DIAGNOSIS — Z3483 Encounter for supervision of other normal pregnancy, third trimester: Secondary | ICD-10-CM | POA: Diagnosis present

## 2014-08-31 DIAGNOSIS — Z3A40 40 weeks gestation of pregnancy: Secondary | ICD-10-CM | POA: Diagnosis present

## 2014-08-31 LAB — CBC
HCT: 35.5 % — ABNORMAL LOW (ref 36.0–46.0)
Hemoglobin: 11.8 g/dL — ABNORMAL LOW (ref 12.0–15.0)
MCH: 25.3 pg — ABNORMAL LOW (ref 26.0–34.0)
MCHC: 33.2 g/dL (ref 30.0–36.0)
MCV: 76.2 fL — AB (ref 78.0–100.0)
PLATELETS: 276 10*3/uL (ref 150–400)
RBC: 4.66 MIL/uL (ref 3.87–5.11)
RDW: 15.6 % — AB (ref 11.5–15.5)
WBC: 11.1 10*3/uL — AB (ref 4.0–10.5)

## 2014-08-31 LAB — RPR: RPR Ser Ql: NONREACTIVE

## 2014-08-31 LAB — TYPE AND SCREEN
ABO/RH(D): O POS
ANTIBODY SCREEN: NEGATIVE

## 2014-08-31 MED ORDER — ONDANSETRON HCL 4 MG/2ML IJ SOLN: 4.0000 mg | INTRAMUSCULAR | Status: DC | PRN

## 2014-08-31 MED ORDER — ZOLPIDEM TARTRATE 5 MG PO TABS: 5.0000 mg | ORAL_TABLET | Freq: Every evening | ORAL | Status: DC | PRN

## 2014-08-31 MED ORDER — WITCH HAZEL-GLYCERIN EX PADS: 1.0000 "application " | MEDICATED_PAD | CUTANEOUS | Status: DC | PRN

## 2014-08-31 MED ORDER — PHENYLEPHRINE 40 MCG/ML (10ML) SYRINGE FOR IV PUSH (FOR BLOOD PRESSURE SUPPORT)
80.0000 ug | PREFILLED_SYRINGE | INTRAVENOUS | Status: DC | PRN
  Administered 2014-08-31: 80 ug via INTRAVENOUS
  Filled 2014-08-31: qty 2
  Filled 2014-08-31: qty 20

## 2014-08-31 MED ORDER — TETANUS-DIPHTH-ACELL PERTUSSIS 5-2.5-18.5 LF-MCG/0.5 IM SUSP: 0.5000 mL | Freq: Once | INTRAMUSCULAR | Status: DC

## 2014-08-31 MED ORDER — LANOLIN HYDROUS EX OINT: TOPICAL_OINTMENT | CUTANEOUS | Status: DC | PRN

## 2014-08-31 MED ORDER — DIPHENHYDRAMINE HCL 50 MG/ML IJ SOLN: 12.5000 mg | INTRAMUSCULAR | Status: DC | PRN

## 2014-08-31 MED ORDER — SENNOSIDES-DOCUSATE SODIUM 8.6-50 MG PO TABS
2.0000 | ORAL_TABLET | ORAL | Status: DC
  Administered 2014-09-01: 2 via ORAL
  Filled 2014-08-31 (×2): qty 2

## 2014-08-31 MED ORDER — BENZOCAINE-MENTHOL 20-0.5 % EX AERO
1.0000 "application " | INHALATION_SPRAY | CUTANEOUS | Status: DC | PRN
  Filled 2014-08-31: qty 56

## 2014-08-31 MED ORDER — ACETAMINOPHEN 325 MG PO TABS: 650.0000 mg | ORAL_TABLET | ORAL | Status: DC | PRN

## 2014-08-31 MED ORDER — DIPHENHYDRAMINE HCL 25 MG PO CAPS: 25.0000 mg | ORAL_CAPSULE | Freq: Four times a day (QID) | ORAL | Status: DC | PRN

## 2014-08-31 MED ORDER — OXYCODONE-ACETAMINOPHEN 5-325 MG PO TABS
1.0000 | ORAL_TABLET | ORAL | Status: DC | PRN
  Administered 2014-09-01 (×2): 1 via ORAL
  Filled 2014-08-31 (×2): qty 1

## 2014-08-31 MED ORDER — PRENATAL MULTIVITAMIN CH
1.0000 | ORAL_TABLET | Freq: Every day | ORAL | Status: DC
  Administered 2014-09-01: 1 via ORAL
  Filled 2014-08-31: qty 1

## 2014-08-31 MED ORDER — FENTANYL CITRATE (PF) 100 MCG/2ML IJ SOLN
100.0000 ug | INTRAMUSCULAR | Status: DC | PRN
  Administered 2014-08-31: 100 ug via INTRAVENOUS
  Filled 2014-08-31: qty 2

## 2014-08-31 MED ORDER — ONDANSETRON HCL 4 MG/2ML IJ SOLN: 4.0000 mg | Freq: Four times a day (QID) | INTRAMUSCULAR | Status: DC | PRN

## 2014-08-31 MED ORDER — ACETAMINOPHEN 325 MG PO TABS
650.0000 mg | ORAL_TABLET | ORAL | Status: DC | PRN
Start: 2014-08-31 — End: 2014-09-02

## 2014-08-31 MED ORDER — DIBUCAINE 1 % RE OINT: 1.0000 "application " | TOPICAL_OINTMENT | RECTAL | Status: DC | PRN

## 2014-08-31 MED ORDER — LACTATED RINGERS IV SOLN
500.0000 mL | INTRAVENOUS | Status: DC | PRN
  Administered 2014-08-31: 500 mL via INTRAVENOUS

## 2014-08-31 MED ORDER — CITRIC ACID-SODIUM CITRATE 334-500 MG/5ML PO SOLN: 30.0000 mL | ORAL | Status: DC | PRN

## 2014-08-31 MED ORDER — LIDOCAINE HCL (PF) 1 % IJ SOLN
INTRAMUSCULAR | Status: DC | PRN
  Administered 2014-08-31 (×2): 9 mL

## 2014-08-31 MED ORDER — OXYCODONE-ACETAMINOPHEN 5-325 MG PO TABS: 1.0000 | ORAL_TABLET | ORAL | Status: DC | PRN

## 2014-08-31 MED ORDER — OXYTOCIN BOLUS FROM INFUSION
500.0000 mL | INTRAVENOUS | Status: DC
  Administered 2014-08-31: 500 mL via INTRAVENOUS

## 2014-08-31 MED ORDER — IBUPROFEN 600 MG PO TABS
600.0000 mg | ORAL_TABLET | Freq: Four times a day (QID) | ORAL | Status: DC
  Administered 2014-08-31 – 2014-09-02 (×6): 600 mg via ORAL
  Filled 2014-08-31 (×7): qty 1

## 2014-08-31 MED ORDER — LIDOCAINE HCL (PF) 1 % IJ SOLN
30.0000 mL | INTRAMUSCULAR | Status: DC | PRN
  Filled 2014-08-31: qty 30

## 2014-08-31 MED ORDER — OXYCODONE-ACETAMINOPHEN 5-325 MG PO TABS: 2.0000 | ORAL_TABLET | ORAL | Status: DC | PRN

## 2014-08-31 MED ORDER — FLEET ENEMA 7-19 GM/118ML RE ENEM: 1.0000 | ENEMA | Freq: Every day | RECTAL | Status: DC | PRN

## 2014-08-31 MED ORDER — LACTATED RINGERS IV SOLN
INTRAVENOUS | Status: DC
  Administered 2014-08-31 (×2): via INTRAVENOUS

## 2014-08-31 MED ORDER — EPHEDRINE 5 MG/ML INJ
10.0000 mg | INTRAVENOUS | Status: DC | PRN
  Filled 2014-08-31: qty 2

## 2014-08-31 MED ORDER — ONDANSETRON HCL 4 MG PO TABS: 4.0000 mg | ORAL_TABLET | ORAL | Status: DC | PRN

## 2014-08-31 MED ORDER — SIMETHICONE 80 MG PO CHEW: 80.0000 mg | CHEWABLE_TABLET | ORAL | Status: DC | PRN

## 2014-08-31 MED ORDER — OXYTOCIN 40 UNITS IN LACTATED RINGERS INFUSION - SIMPLE MED
62.5000 mL/h | INTRAVENOUS | Status: DC
  Filled 2014-08-31: qty 1000

## 2014-08-31 MED ORDER — FENTANYL 2.5 MCG/ML BUPIVACAINE 1/10 % EPIDURAL INFUSION (WH - ANES)
14.0000 mL/h | INTRAMUSCULAR | Status: DC | PRN
  Administered 2014-08-31: 14 mL/h via EPIDURAL
  Filled 2014-08-31: qty 125

## 2014-08-31 NOTE — MAU Note (Signed)
Pt reports contractions and lower abd pain.

## 2014-08-31 NOTE — Anesthesia Preprocedure Evaluation (Signed)
Anesthesia Evaluation  Patient identified by MRN, date of birth, ID band Patient awake    Reviewed: Allergy & Precautions, H&P , NPO status , Patient's Chart, lab work & pertinent test results  Airway Mallampati: II  TM Distance: >3 FB Neck ROM: full    Dental no notable dental hx.    Pulmonary neg pulmonary ROS,    Pulmonary exam normal        Cardiovascular negative cardio ROS Normal cardiovascular exam     Neuro/Psych negative neurological ROS  negative psych ROS   GI/Hepatic negative GI ROS, Neg liver ROS,   Endo/Other  negative endocrine ROS  Renal/GU negative Renal ROS     Musculoskeletal   Abdominal (+) + obese,   Peds  Hematology negative hematology ROS (+)   Anesthesia Other Findings   Reproductive/Obstetrics (+) Pregnancy                             Anesthesia Physical Anesthesia Plan  ASA: II  Anesthesia Plan: Epidural   Post-op Pain Management:    Induction:   Airway Management Planned:   Additional Equipment:   Intra-op Plan:   Post-operative Plan:   Informed Consent: I have reviewed the patients History and Physical, chart, labs and discussed the procedure including the risks, benefits and alternatives for the proposed anesthesia with the patient or authorized representative who has indicated his/her understanding and acceptance.     Plan Discussed with:   Anesthesia Plan Comments:         Anesthesia Quick Evaluation  

## 2014-08-31 NOTE — Anesthesia Procedure Notes (Signed)
Epidural Patient location during procedure: OB Start time: 08/31/2014 9:39 AM End time: 08/31/2014 9:43 AM  Staffing Anesthesiologist: Leilani AbleHATCHETT, Carlo Lorson  Preanesthetic Checklist Completed: patient identified, surgical consent, pre-op evaluation, timeout performed, IV checked, risks and benefits discussed and monitors and equipment checked  Epidural Patient position: sitting Prep: site prepped and draped and DuraPrep Patient monitoring: continuous pulse ox and blood pressure Approach: midline Location: L3-L4 Injection technique: LOR air  Needle:  Needle type: Tuohy  Needle gauge: 17 G Needle length: 9 cm and 9 Needle insertion depth: 8 cm Catheter type: closed end flexible Catheter size: 19 Gauge Catheter at skin depth: 13 cm Test dose: negative and Other  Assessment Sensory level: T9 Events: blood not aspirated, injection not painful, no injection resistance, negative IV test and no paresthesia  Additional Notes Reason for block:procedure for pain

## 2014-08-31 NOTE — Anesthesia Postprocedure Evaluation (Signed)
  Anesthesia Post-op Note  Patient: Claudia DesanctisVanessa Garson  Procedure(s) Performed: * No procedures listed *  Patient Location: Mother/Baby  Anesthesia Type:Epidural  Level of Consciousness: awake  Airway and Oxygen Therapy: Patient Spontanous Breathing  Post-op Pain: mild  Post-op Assessment: Patient's Cardiovascular Status Stable and Respiratory Function Stable  Post-op Vital Signs: stable  Last Vitals:  Filed Vitals:   08/31/14 1603  BP: 123/69  Pulse: 88  Temp:   Resp:     Complications: No apparent anesthesia complications

## 2014-08-31 NOTE — Progress Notes (Signed)
Labor Progress Note  S: Pt is doing well. Pain is well-tolerated. Endorses pressure but no pain.  O:  BP 124/66 mmHg  Pulse 94  Temp(Src) 98.2 F (36.8 C) (Oral)  Resp 18  Ht 5' 5.5" (1.664 m)  Wt 101.606 kg (224 lb)  BMI 36.70 kg/m2  LMP 12/03/2013 Cat II (preciously variable decel and late decel between 11AM and noon) CVE:   A&P: 29 y.o. G2P1001 3472w5d  #Prior: variable decel and minimal variability. With O2 and rotation, FTR is cat I (no variable decel, moderate variability) #Expectant management. Plan for SVD. #Pain: controlled with epidural  Forest BeckerEunice Regginald Pask, Med Student 1:07 PM

## 2014-08-31 NOTE — H&P (Signed)
LABOR ADMISSION HISTORY AND PHYSICAL  Barbara Waller is a 29 y.o. female G2P1001 with IUP at 7447w5d by LMP + 7wk US presenting for SOL. She reports that symptoms started last night with pelvic pressure and cramps and some back pain. She went to bed around midnight, continued to wake her up, seemed to be progressing with inc str and frequency, now about q 2-4 min. Mucus plug and bloody show, but no significant leakage of fluids. + regular contractions + FM No LOF, no VB - Denies any blurry vision, headaches or peripheral edema, and RUQ pain. She plans on breast/bottle feeding. She request OCPs for birth control.   Last [place] On 05/30/14 @[redacted]w[redacted]d , CWD, normal anatomy, cephalic presentation, 1246g, 16%69% EFW  Prenatal History/Complications: - [facility] Uncomplicated. Recent NST, scheduled for IOL on Sat 5/21 for post-dates.  Past Medical History: Past Medical History  Diagnosis Date  . 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 Obesity   . Vaginal Pap smear, abnormal     Past Surgical History: Past Surgical History  Procedure Laterality Date  . Colposcopy    . Colostomy    . 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 Marital Status: Married    Spouse Name: N/A  . Number of Children: N/A  . Years of Education: N/A   Social History Main Topics  . Smoking status: Never Smoker   . Smokeless tobacco: Not on file  . Alcohol Use: No  . Drug Use: No  . Sexual Activity:    Partners: Male   Other Topics Concern  . None   Social History Narrative    Family History: Family History  Problem Relation Age of Onset  . Diabetes Mother   . Hypertension Mother   . Diabetes  Maternal Grandmother   . Anesthesia problems Neg Hx     Allergies: No Known Allergies  Prescriptions prior to admission  Medication Sig Dispense Refill Last Dose  . Prenatal Vit-Fe Fumarate-FA (PRENATAL VITAMIN PO) Take by mouth.   08/30/2014 at Unknown time  . acetaminophen (TYLENOL) 500 MG tablet Take 500 mg by mouth every 6 (six) hours as needed.   More than a month at Unknown time  . cyclobenzaprine (FLEXERIL) 10 MG tablet Take 0.5-1 tablets (5-10 mg total) by mouth 3 (three) times daily as needed for muscle spasms. (Patient not taking: Reported on 08/22/2014) 30 tablet 0 Not Taking     Review of Systems   All systems reviewed and negative except as stated in HPI  BP 118/79 mmHg  Pulse 73  Temp(Src) 98.8 F (37.1 C) (Oral)  Resp 20  Ht 5' 5.5" (1.664 m)  Wt 101.606 kg (224 lb)  BMI 36.70 kg/m2  LMP 12/03/2013 General appearance: alert and cooperative, uncomfortable with regular contractions, NAD Lungs: clear to auscultation bilaterally Heart: regular rate and rhythm Abdomen: soft, non-tender; bowel sounds normal. Appropriately gravid for GA Extremities: Homans sign is negative, no sign of DVT, edema DTR's +2 Presentation: cephalic Fetal monitoringBaseline: 140 bpm, Variability: Good {> 6 bpm), Accelerations: Reactive and Decelerations: Absent (initially thought some  variable decels, but more consistent with maternal tracing d/t positioning) Uterine activityFrequency: Every 2-2.5 minutes Dilation: 3 Effacement (%): 90 Station: -1 Exam by:: Weston,RN   Prenatal labs: ABO, Rh: O/POS/-- (10/05 1636) Antibody: NEG (10/05 1636) Rubella:   RPR: NON REAC (03/08 1431)  HBsAg: NEGATIVE (10/05 1636)  HIV: NONREACTIVE (03/08 1431)  GBS: Negative (04/20 0000)  1 hr Glucola early 109, 3rd trimester 124 Genetic screening - quad screen negative Anatomy US normal  Prenatal Transfer Tool  Maternal Diabetes: No Genetic Screening: Normal Maternal Ultrasounds/Referrals:  Normal Fetal Ultrasounds or other Referrals:  None Maternal Substance Abuse:  No Significant Maternal Medications:  None Significant Maternal Lab Results: None  No results found for this or any previous visit (from the past 24 hour(s)).  Patient Active Problem List   Diagnosis Date Noted  . Post term pregnancy, antepartum 08/29/2014  . Obesity in pregnancy 04/11/2014  . Supervision of normal pregnancy 01/16/2014    Assessment: Barbara Waller is a 29 y.o. G2P1001 at 6560w5d here for SOL in setting of post-dates. GBS negative.  #Labor: Expectant management. Seems to be progressing into active labor. #Pain: Fentanyl IV 100mcg q 1 hr PRN. May have epidural #FWB: Cat 1 (no variable decels, actually maternal tracing d/t positioning) #ID:  GBS negative #MOF: breast / bottle #MOC: OCPs #Circ:  undecided  Saralyn PilarAlexander Alonia Dibuono, DO [facility], PGY-2 5/19, [phone]

## 2014-09-01 LAB — CBC
HEMATOCRIT: 29 % — AB (ref 36.0–46.0)
HEMOGLOBIN: 9.6 g/dL — AB (ref 12.0–15.0)
MCH: 25.5 pg — ABNORMAL LOW (ref 26.0–34.0)
MCHC: 33.1 g/dL (ref 30.0–36.0)
MCV: 76.9 fL — ABNORMAL LOW (ref 78.0–100.0)
Platelets: 228 10*3/uL (ref 150–400)
RBC: 3.77 MIL/uL — ABNORMAL LOW (ref 3.87–5.11)
RDW: 15.6 % — ABNORMAL HIGH (ref 11.5–15.5)
WBC: 10 10*3/uL (ref 4.0–10.5)

## 2014-09-01 NOTE — Lactation Note (Signed)
This note was copied from the chart of Boy Claudia DesanctisVanessa Berwick. Lactation Consultation Note  Patient Name: Boy Claudia DesanctisVanessa Coomer [id number]'s Date: 09/01/2014 Reason for consult: Follow-up assessment;Breast/nipple pain;Difficult latch Mom had called out requesting formula. LC went to see Mom to see if she needed help with latch. Baby asleep. Parents asking if they supplemented with formula would baby be more satisfied. Mom only BF on 1 breast past 2 feedings due to nipple pain. Using nipple shield now but has not used on the left breast. Encouraged Mom to BF on both breasts each feeding, using nipple shield to help with nipple pain. Continue to post pump to encourage milk production and have EBM to supplement. Discussed risk of early supplementation to BF success unless medically necessary, but advised if parents continue to feel they need to supplement to advise RN and she would assist them to supplement using curved tipped syringe. Parents agree and willing to see how the next feeding goes and Mom due to pump again at 1600. Call for assist as needed.   Maternal Data Has patient been taught Hand Expression?: Yes Does the patient have breastfeeding experience prior to this delivery?: Yes  Feeding Feeding Type: Breast Milk Length of feed: 10 min  LATCH Score/Interventions Latch: Repeated attempts needed to sustain latch, nipple held in mouth throughout feeding, stimulation needed to elicit sucking reflex. (using #24 nipple shield due to nipple pain) Intervention(s): Adjust position;Assist with latch;Breast massage;Breast compression  Audible Swallowing: A few with stimulation  Type of Nipple: Everted at rest and after stimulation (aerola edema, nipple thick at base)  Comfort (Breast/Nipple): Filling, red/small blisters or bruises, mild/mod discomfort  Problem noted: Mild/Moderate discomfort Interventions  (Cracked/bleeding/bruising/blister): Expressed breast milk to nipple Interventions (Mild/moderate  discomfort): Comfort gels  Hold (Positioning): Assistance needed to correctly position infant at breast and maintain latch. Intervention(s): Breastfeeding basics reviewed;Support Pillows;Position options;Skin to skin  LATCH Score: 6  Lactation Tools Discussed/Used Tools: Nipple Shields;Pump;Comfort gels Nipple shield size: 24 Breast pump type: Double-Electric Breast Pump WIC Program: Yes   Consult Status Consult Status: Follow-up Date: 09/02/14 Follow-up type: In-patient    Alfred LevinsGranger, Ylianna Almanzar Ann 09/01/2014, 4:31 PM

## 2014-09-01 NOTE — Progress Notes (Signed)
Post Partum Day 1  Subjective:  Claudia DesanctisVanessa Belcher is a 29 y.o. [id number] 4742w5d s/p NSVD.  No acute events overnight.  Pt denies problems with ambulating, voiding or po intake.  She denies nausea or vomiting.  Pain is well controlled.  She has had flatus. She has not had bowel movement.  Lochia Moderate.  Plan for birth control is oral contraceptives (estrogen/progesterone).  Method of Feeding: Breast.  Objective: BP 102/57 mmHg  Pulse 82  Temp(Src) 98.4 F (36.9 C) (Oral)  Resp 18  Ht 5' 5.5" (1.664 m)  Wt 224 lb (101.606 kg)  BMI 36.70 kg/m2  SpO2 100%  LMP 12/03/2013  Breastfeeding? Unknown  Physical Exam:  General: alert, cooperative and no distress Lochia:normal flow Chest: CTAB Heart: RRR no m/r/g Abdomen: +BS, soft, nontender, fundus firm at umbilicus DVT Evaluation: No evidence of DVT seen on physical exam. Extremities: no edema   Recent Labs  08/31/14 0730 09/01/14 0535  HGB 11.8* 9.6*  HCT 35.5* 29.0*    Assessment/Plan:  ASSESSMENT: Claudia DesanctisVanessa Gaspari is a 29 y.o. G2P2002 1342w5d ppd #1 s/p NSVD doing well.   Plan for circumcision as an outpatient. [facility] Plan for [facility] tomorrow and Breastfeeding   LOS: 1 day   Caryl AdaJazma Arhaan Chesnut, DO 09/01/2014, 8:06 AM PGY-1, [facility]

## 2014-09-01 NOTE — Lactation Note (Signed)
This note was copied from the chart of Barbara Claudia DesanctisVanessa Timson. Lactation Consultation Note  Patient Name: Barbara Waller EAVWU'JToday's Date: 09/01/2014 Reason for consult: Initial assessment;Difficult latch;Breast/nipple pain Mom is c/o of nipple pain and thinking of pump/bottle to give breasts a break. Baby giving feeding ques and Mom agreed to let this Avala[employer] assist with latch. Mom's right nipple is red, compression line visible. Aerola edema at base of nipple making nipple aerola not very compressible. Pumped with hand pump for few minutes to soften nipple/aerola and attempted latch. Baby could not obtain good depth and Mom wincing with pain. Initiated #24 nipple shield and after few attempts baby was able to obtain depth and develop a good suckling pattern. Baby has been tongue thrusting and humping his tongue, but suck training helped with latch. Advised parents to do suck training to help. Scant amount of colostrum visible in the nipple shield after 10 minutes, Mom reported very little discomfort. Set up DEBP for Mom to post pump on Preemie setting for 15 minutes, flanges changed to size 27. Mom previously pumped approx 1.5 ml of colostrum with hand pump, LC demonstrated how to spoon feed this back to baby. Care for sore nipples reviewed. Comfort gels given by RN. Advised Mom baby should be at the breast 8-12 times in 24 hours and with feeding ques, use nipple shield to help with latch, look for colostrum in nipple shield with feedings. Post pump on preemie setting and give baby back any EBM she receives. Lactation brochure left for review, advised of OP services and support group. Encouraged to call for assist with feedings.   Maternal Data Has patient been taught Hand Expression?: Yes Does the patient have breastfeeding experience prior to this delivery?: Yes  Feeding Feeding Type: Breast Milk Length of feed: 10 min  LATCH Score/Interventions Latch: Repeated attempts needed to sustain latch, nipple  held in mouth throughout feeding, stimulation needed to elicit sucking reflex. (using #24 nipple shield due to nipple pain) Intervention(s): Adjust position;Assist with latch;Breast massage;Breast compression  Audible Swallowing: A few with stimulation  Type of Nipple: Everted at rest and after stimulation (aerola edema, nipple thick at base)  Comfort (Breast/Nipple): Filling, red/small blisters or bruises, mild/mod discomfort  Problem noted: Mild/Moderate discomfort Interventions  (Cracked/bleeding/bruising/blister): Expressed breast milk to nipple Interventions (Mild/moderate discomfort): Comfort gels  Hold (Positioning): Assistance needed to correctly position infant at breast and maintain latch. Intervention(s): Breastfeeding basics reviewed;Support Pillows;Position options;Skin to skin  LATCH Score: 6  Lactation Tools Discussed/Used Tools: Nipple Shields;Pump;Comfort gels Nipple shield size: 24 Breast pump type: Double-Electric Breast Pump WIC Program: Yes   Consult Status Consult Status: Follow-up Date: 09/02/14 Follow-up type: In-patient    Barbara Waller, Barbara Waller 09/01/2014, 2:21 PM

## 2014-09-02 ENCOUNTER — Inpatient Hospital Stay (HOSPITAL_COMMUNITY): Admission: RE | Admit: 2014-09-02 | Payer: Medicaid Other | Source: Ambulatory Visit

## 2014-09-02 MED ORDER — IBUPROFEN 600 MG PO TABS: 600.0000 mg | ORAL_TABLET | Freq: Four times a day (QID) | ORAL | Status: DC

## 2014-09-02 MED ORDER — FERROUS SULFATE 325 (65 FE) MG PO TABS: 325.0000 mg | ORAL_TABLET | Freq: Every day | ORAL | Status: DC

## 2014-09-02 MED ORDER — DOCUSATE SODIUM 100 MG PO CAPS: 100.0000 mg | ORAL_CAPSULE | Freq: Two times a day (BID) | ORAL | Status: DC

## 2014-09-02 NOTE — Discharge Summary (Signed)
Obstetric Discharge Summary Reason for Admission: onset of labor, in setting of post-dates Prenatal Procedures: NST and ultrasound Intrapartum Procedures: spontaneous vaginal delivery Postpartum Procedures: none Complications-Operative and Postpartum: none HEMOGLOBIN  Date Value Ref Range Status  09/01/2014 9.6* 12.0 - 15.0 g/dL Final   HCT  Date Value Ref Range Status  09/01/2014 29.0* 36.0 - 46.0 % Final    Discharge Diagnoses: Post-date pregnancy delivered  Hospital Course:  Claudia DesanctisVanessa Mcclenahan is a 29 y.o. G2P2002 at 462w5d who was admitted on 08/31/14 for SOL in setting of post-dates. Pregnancy was uncomplicated. Maternal GBS negative. Progressed to vaginal delivery within 24 hours (see copied delivery note below). Postpartum course was uncomplicated, tolerating PO and ambulation, pain controlled, bleeding improved, +flatus, and no barriers to discharge. Hgb down to 9.6 from 11.8, add Fe sulfate 325mg  PO daily. Plan for continue breast / bottle feeding, contraception with OCPs, [facility], follow-up in 4-6 weeks for postpartum visit.  Delivery Note At 2:02 PM a viable female was delivered via Vaginal, Spontaneous Delivery (Presentation: Right Occiput Anterior). APGAR: 8, 9; weight .  Placenta status: Intact, Spontaneous. Cord: 3 vessels with the following complications: None.   Anesthesia: Epidural  Episiotomy: None Lacerations: None Est. Blood Loss (mL): 186  Mom to postpartum. Baby to Couplet care / Skin to Skin.  Caryl AdaJazma Phelps, DO 08/31/2014, 3:01 PM PGY-1, [facility]  Patient is a G1P0 at 112w5d who was admitted in active labor, uncomplicated prenatal course. She progressed without augmentation and was AROM'd when complete as she began pushing.  I was gloved and present for delivery in its entirety. Second stage of labor progressed, baby delivered after ~10 contractions. No decels during second stage noted. Complications: thick  mec Lacerations: None EBL: 300  Elita Booneoberts, Caroline C, MD 3:05 PM    Physical Exam:  General: alert and cooperative, well-appearing, NAD Lochia: appropriate Uterine Fundus: firm DVT Evaluation: No evidence of DVT seen on physical exam. Negative Homan's sign. No cords or calf tenderness. No significant calf/ankle edema.  Discharge Information: Date: 09/02/2014 Activity: pelvic rest Diet: routine Medications: PNV, Ibuprofen, Colace and Iron Baby feeding: plans to breastfeed, bottle supplement Contraception: oral contraceptives (estrogen/progesterone) Condition: stable Instructions: refer to practice specific booklet Discharge to: home   Newborn Data: Live born female  Birth Weight: 7 lb 11 oz (3487 g) APGAR: 8, 9  Anticipated discharge home with mother.  Saralyn PilarAlexander Keiton Cosma, DO [facility], PGY-2 09/02/2014, 9:08 AM

## 2014-09-02 NOTE — Lactation Note (Signed)
This note was copied from the chart of Barbara Claudia DesanctisVanessa Cutrone. Lactation Consultation Note  Baby recently bf for 15 min. Mother is hand expressing for nipple soreness and wearing comfort gels. Discussed supply and demand, engorgement care and monitoring voids/stools. Provided mother with volume guidelines.  Suggest she breastfeed or pump to establish her milk supply.  Patient Name: Barbara Waller NWGNF'AToday's Date: 09/02/2014 Reason for consult: Follow-up assessment   Maternal Data    Feeding Feeding Type: Breast Fed Length of feed: 15 min  LATCH Score/Interventions                      Lactation Tools Discussed/Used     Consult Status Consult Status: Complete    Hardie PulleyBerkelhammer, Elizabeht Suto Boschen 09/02/2014, 9:16 AM

## 2014-09-02 NOTE — Discharge Instructions (Signed)

## 2014-09-04 LAB — CCBB MATERNAL DONOR DRAW

## 2014-09-05 ENCOUNTER — Emergency Department (HOSPITAL_COMMUNITY)
Admission: EM | Admit: 2014-09-05 | Discharge: 2014-09-05 | Disposition: A | Discharge status: Home / Self Care | Payer: Medicaid Other | Attending: Emergency Medicine | Admitting: Emergency Medicine

## 2014-09-05 ENCOUNTER — Encounter (HOSPITAL_COMMUNITY): Payer: Self-pay | Admitting: *Deleted

## 2014-09-05 DIAGNOSIS — Z79899 Other long term (current) drug therapy: Secondary | ICD-10-CM | POA: Diagnosis not present

## 2014-09-05 DIAGNOSIS — O99215 Obesity complicating the puerperium: Secondary | ICD-10-CM | POA: Insufficient documentation

## 2014-09-05 DIAGNOSIS — E669 Obesity, unspecified: Secondary | ICD-10-CM | POA: Insufficient documentation

## 2014-09-05 DIAGNOSIS — O9953 Diseases of the respiratory system complicating the puerperium: Secondary | ICD-10-CM | POA: Insufficient documentation

## 2014-09-05 DIAGNOSIS — T7840XA Allergy, unspecified, initial encounter: Secondary | ICD-10-CM

## 2014-09-05 DIAGNOSIS — O9A23 Injury, poisoning and certain other consequences of external causes complicating the puerperium: Secondary | ICD-10-CM | POA: Diagnosis present

## 2014-09-05 DIAGNOSIS — T454X5A Adverse effect of iron and its compounds, initial encounter: Secondary | ICD-10-CM | POA: Diagnosis not present

## 2014-09-05 DIAGNOSIS — J351 Hypertrophy of tonsils: Secondary | ICD-10-CM | POA: Insufficient documentation

## 2014-09-05 MED ORDER — METHYLPREDNISOLONE SODIUM SUCC 125 MG IJ SOLR
125.0000 mg | Freq: Once | INTRAMUSCULAR | Status: AC
  Administered 2014-09-05: 125 mg via INTRAVENOUS
  Filled 2014-09-05: qty 2

## 2014-09-05 MED ORDER — DIPHENHYDRAMINE HCL 50 MG/ML IJ SOLN
25.0000 mg | Freq: Once | INTRAMUSCULAR | Status: AC
  Administered 2014-09-05: 25 mg via INTRAVENOUS
  Filled 2014-09-05: qty 1

## 2014-09-05 NOTE — ED Notes (Signed)
Pt. Started taking ferrous sulfate last night and noticed her tongue swelling . No other symptoms. Pt. States difficulty swallowing.

## 2014-09-05 NOTE — ED Notes (Signed)
Pt states she feels "much better" 

## 2014-09-05 NOTE — Discharge Instructions (Signed)
Please pump and dump your breast milk for 12 hours after your last dose of benadryl. Please stop taking Ferrous sulfate pills- as this was the likely cause of your reaction.   Drug Allergy Allergic reactions to medicines are common. Some allergic reactions are mild. A delayed type of drug allergy that occurs 1 week or more after exposure to a medicine or vaccine is called serum sickness. A life-threatening, sudden (acute) allergic reaction that involves the whole body is called anaphylaxis. CAUSES  "True" drug allergies occur when there is an allergic reaction to a medicine. This is caused by overactivity of the immune system. First, the body becomes sensitized. The immune system is triggered by your first exposure to the medicine. Following this first exposure, future exposure to the same medicine may be life-threatening. Almost any medicine can cause an allergic reaction. Common ones are:  Penicillin.  Sulfonamides (sulfa drugs).  Local anesthetics.  X-ray dyes that contain iodine. SYMPTOMS  Common symptoms of a minor allergic reaction are:  Swelling around the mouth.  An itchy red rash or hives.  Vomiting or diarrhea. Anaphylaxis can cause swelling of the mouth and throat. This makes it difficult to breathe and swallow. Severe reactions can be fatal within seconds, even after exposure to only a trace amount of the drug that causes the reaction. HOME CARE INSTRUCTIONS   If you are unsure of what caused your reaction, keep a diary of foods and medicines used. Include the symptoms that followed. Avoid anything that causes reactions.  You may want to follow up with an allergy specialist after the reaction has cleared in order to be tested to confirm the allergy. It is important to confirm that your reaction is an allergy, not just a side effect to the medicine. If you have a true allergy to a medicine, this may prevent that medicine and related medicines from being given to you when you  are very ill.  If you have hives or a rash:  Take medicines as directed by your caregiver.  You may use an over-the-counter antihistamine (diphenhydramine) as needed.  Apply cold compresses to the skin or take baths in cool water. Avoid hot baths or showers.  If you are severely allergic:  Continuous observation after a severe reaction may be needed. Hospitalization is often required.  Wear a medical alert bracelet or necklace stating your allergy.  You and your family must learn how to use an anaphylaxis kit or give an epinephrine injection to temporarily treat an emergency allergic reaction. If you have had a severe reaction, always carry your epinephrine injection or anaphylaxis kit with you. This can be lifesaving if you have a severe reaction.  Do not drive or perform tasks after treatment until the medicines used to treat your reaction have worn off, or until your caregiver says it is okay. SEEK MEDICAL CARE IF:   You think you had an allergic reaction. Symptoms usually start within 30 minutes after exposure.  Symptoms are getting worse rather than better.  You develop new symptoms.  The symptoms that brought you to your caregiver return. SEEK IMMEDIATE MEDICAL CARE IF:   You have swelling of the mouth, difficulty breathing, or wheezing.  You have a tight feeling in your chest or throat.  You develop hives, swelling, or itching all over your body.  You develop severe vomiting or diarrhea.  You feel faint or pass out. This is an emergency. Use your epinephrine injection or anaphylaxis kit as you have been instructed.  Call for emergency medical help. Even if you improve after the injection, you need to be examined at a hospital emergency department. MAKE SURE YOU:   Understand these instructions.  Will watch your condition.  Will get help right away if you are not doing well or get worse. Document Released: 03/31/2005 Document Revised: 06/23/2011 Document Reviewed:  09/04/2010 [facility] Patient Information 2015 [place], [place]. This information is not intended to replace advice given to you by your health care provider. Make sure you discuss any questions you have with your health care provider. Iron-Rich Diet An iron-rich diet contains foods that are good sources of iron. Iron is an important mineral that helps your body produce hemoglobin. Hemoglobin is a protein in red blood cells that carries oxygen to the body's tissues. Sometimes, the iron level in your blood can be low. This may be caused by: A lack of iron in your diet. Blood loss. Times of growth, such as during pregnancy or during a child's growth and development. Low levels of iron can cause a decrease in the number of red blood cells. This can result in iron deficiency anemia. Iron deficiency anemia symptoms include: Tiredness. Weakness. Irritability. Increased chance of infection. Here are some recommendations for daily iron intake: Males older than 31 years of age need 8 mg of iron per day. Women ages 20 to 49 need 18 mg of iron per day. Pregnant women need 27 mg of iron per day, and women who are over 55 years of age and breastfeeding need 9 mg of iron per day. Women over the age of 54 need 8 mg of iron per day. SOURCES OF IRON There are 2 types of iron that are found in food: heme iron and nonheme iron. Heme iron is absorbed by the body better than nonheme iron. Heme iron is found in meat, poultry, and fish. Nonheme iron is found in grains, beans, and vegetables. Heme Iron Sources Food / Iron (mg) Chicken liver, 3 oz (85 g)/ 10 mg Beef liver, 3 oz (85 g)/ 5.5 mg Oysters, 3 oz (85 g)/ 8 mg Beef, 3 oz (85 g)/ 2 to 3 mg Shrimp, 3 oz (85 g)/ 2.8 mg [place], 3 oz (85 g)/ 2 mg Chicken, 3 oz (85 g) / 1 mg Fish (tuna, halibut), 3 oz (85 g)/ 1 mg Pork, 3 oz (85 g)/ 0.9 mg Nonheme Iron Sources Food / Iron (mg) Ready-to-eat breakfast cereal, iron-fortified / 3.9 to 7 mg Tofu,  cup / 3.4  mg Kidney beans,  cup / 2.6 mg Baked potato with skin / 2.7 mg Asparagus,  cup / 2.2 mg Avocado / 2 mg Dried peaches,  cup / 1.6 mg Raisins,  cup / 1.5 mg Soy milk, 1 cup / 1.5 mg Whole-wheat bread, 1 slice / 1.2 mg Spinach, 1 cup / 0.8 mg Broccoli,  cup / 0.6 mg IRON ABSORPTION Certain foods can decrease the body's absorption of iron. Try to avoid these foods and beverages while eating meals with iron-containing foods: Coffee. Tea. Fiber. Soy. Foods containing vitamin C can help increase the amount of iron your body absorbs from iron sources, especially from nonheme sources. Eat foods with vitamin C along with iron-containing foods to increase your iron absorption. Foods that are high in vitamin C include many fruits and vegetables. Some good sources are: Fresh orange juice. Oranges. Strawberries. Mangoes. Grapefruit. Red bell peppers. Green bell peppers. Broccoli. Potatoes with skin. Tomato juice. Document Released: 11/12/2004 Document Revised: 06/23/2011 Document Reviewed: 09/19/2010 [employer] Patient  Information 2015 [place], [place]. This information is not intended to replace advice given to you by your health care provider. Make sure you discuss any questions you have with your health care provider.

## 2014-09-05 NOTE — ED Provider Notes (Signed)
CSN: [id number]     Arrival date & time 09/05/14  4098 History   First MD Initiated Contact with Patient 09/05/14 [phone]     Chief Complaint  Patient presents with  . Allergic Reaction   Adaeze Better is a 29 y.o. female with recent spontaneous vaginal delivery 4 days ago who presents to the ED complaining of tongue swelling starting around 1 am this morning. She initially reports having some trouble swallowing, but denies this at my evaluation. The patient reports taking her first dose of ferrous sulfate yesterday morning. She reports this is her first does of this medication. She denies any other new medications or new foods. She denies changes to any of her products such as lotions, soaps, perfumes or detergents. She denies any new pets or animals at home. She currently just complains of tongue swelling, that she reports is slowly improving since her arrival. She denies taking any medications for treatment today. She reports being able to swallow her own secretions without difficulty. The patient denies any fevers, chills, dental pain, difficulty swallowing, sore throat, shortness of breath, difficulty breathing, coughing, wheezing, or rashes on her body. The patient is currently breast-feeding. The patient denies any history of allergic reactions. She denies any history of drug allergies. She denies ever having tongue swelling previously.  (Consider location/radiation/quality/duration/timing/severity/associated sxs/prior Treatment) HPI  Past Medical History  Diagnosis Date  . 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 Obesity   . Vaginal Pap smear, abnormal    Past Surgical History  Procedure Laterality Date  . Colposcopy    . Colostomy    . Axilla abscess     Family History  Problem Relation Age of Onset  . Diabetes Mother   . Hypertension Mother   . Diabetes Maternal Grandmother   . Anesthesia problems Neg Hx    History  Substance  Use Topics  . Smoking status: Never Smoker   . Smokeless tobacco: Never Used  . Alcohol Use: No   OB History    Gravida Para Term Preterm AB TAB SAB Ectopic Multiple Living   0 2     Review of Systems  Constitutional: Negative for fever and chills.  HENT: Negative for congestion, dental problem, drooling, ear pain, facial swelling, rhinorrhea, sneezing, sore throat and trouble swallowing.        Tongue swelling  Eyes: Negative for pain and visual disturbance.  Respiratory: Negative for cough, chest tightness, shortness of breath and wheezing.   Cardiovascular: Negative for chest pain and palpitations.  Gastrointestinal: Negative for nausea, vomiting, abdominal pain and diarrhea.  Genitourinary: Negative for dysuria.  Musculoskeletal: Negative for back pain and neck pain.  Skin: Negative for rash.  Allergic/Immunologic: Negative for environmental allergies and food allergies.  Neurological: Negative for light-headedness, numbness and headaches.      Allergies  Review of patient's allergies indicates no known allergies.  Home Medications   Prior to Admission medications   Medication Sig Start Date End Date Taking? Authorizing Provider  ferrous sulfate 325 (65 FE) MG tablet Take 1 tablet (325 mg total) by mouth daily with breakfast. 09/02/14  Yes Smitty Cords, DO  ibuprofen (ADVIL,MOTRIN) 600 MG tablet Take 1 tablet (600 mg total) by mouth every 6 (six) hours. 09/02/14  Yes Smitty Cords, DO  Prenatal Vit-Fe Fumarate-FA (PRENATAL MULTIVITAMIN) TABS tablet Take 1 tablet by mouth daily at 12 noon.  Yes Historical Provider, MD  docusate sodium (COLACE) 100 MG capsule Take 1 capsule (100 mg total) by mouth 2 (two) times daily. 09/02/14   Netta Neat Karamalegos, DO   BP 114/68 mmHg  Pulse 56  Temp(Src) 98.1 F (36.7 C) (Oral)  Resp 20  Ht  (1.651 m)  Wt 217 lb (98.431 kg)  BMI 36.11 kg/m2  SpO2 100%  Breastfeeding? Yes Physical Exam   Constitutional: She is oriented to person, place, and time. She appears well-developed and well-nourished. No distress.  Nontoxic appearing.  HENT:  Head: Normocephalic and atraumatic.  Right Ear: External ear normal.  Left Ear: External ear normal.  Nose: Nose normal.  Mouth/Throat: Oropharynx is clear and moist. No oropharyngeal exudate.  A mild amount of tonsillar hypertrophy without exudates. No posterior oropharyngeal erythema or edema. Uvula is midline without edema. Soft palate rises symmetrically. Tongue appears to be normal in size. No dental tenderness noted. Bilateral tympanic membranes are pearly-gray without erythema or loss of landmarks.   Eyes: Conjunctivae are normal. Pupils are equal, round, and reactive to light. Right eye exhibits no discharge. Left eye exhibits no discharge.  Neck: Normal range of motion. Neck supple. No JVD present. No tracheal deviation present.  Cardiovascular: Normal rate, regular rhythm, normal heart sounds and intact distal pulses.  Exam reveals no gallop and no friction rub.   No murmur heard. Pulmonary/Chest: Effort normal and breath sounds normal. No respiratory distress. She has no wheezes. She has no rales.  Lungs are clear to auscultation bilaterally. No respiratory distress noted.  Abdominal: Soft. There is no tenderness.  Musculoskeletal: She exhibits no edema.  Lymphadenopathy:    She has no cervical adenopathy.  Neurological: She is alert and oriented to person, place, and time. Coordination normal.  Skin: Skin is warm and dry. No rash noted. She is not diaphoretic. No erythema. No pallor.  No rashes noted.  Psychiatric: She has a normal mood and affect. Her behavior is normal.  Nursing note and vitals reviewed.   ED Course  Procedures (including critical care time) Labs Review Labs Reviewed - No data to display  Imaging Review No results found.   EKG Interpretation None      Filed Vitals:   09/05/14 0536 09/05/14 0646  09/05/14 0700 09/05/14 0730  BP: 118/81 118/77 116/87 114/68  Pulse: 62 53 56 56  Temp: 98.1 F (36.7 C)     TempSrc: Oral     Resp: Height:  (1.651 m)     Weight: 217 lb (98.431 kg)     SpO2: 100% 100% 99% 100%     MDM   Meds given in ED:  Medications  methylPREDNISolone sodium succinate (SOLU-MEDROL) 125 mg/2 mL injection 125 mg (125 mg Intravenous Given 09/05/14 0626)  diphenhydrAMINE (BENADRYL) injection 25 mg (25 mg Intravenous Given 09/05/14 0628)    New Prescriptions   No medications on file    Final diagnoses:  Allergic reaction, initial encounter   This is a 29 y.o. female with recent spontaneous vaginal delivery 4 days ago who presents to the ED complaining of tongue swelling starting around 1 am this morning. She initially reports having some trouble swallowing, but denies this at my evaluation. The patient reports taking her first dose of ferrous sulfate yesterday morning. She reports this is her first does of this medication. She denies any other new medications or new foods. She denies any history of any medication allergies. She denies any history  of any problems with drug reactions or allergies. On exam the patient is afebrile and nontoxic appearing. She is in no apparent distress. There is no evidence of tongue swelling. There is some mild tonsillar hypertrophy but no exudates. No oropharyngeal erythema or posterior oropharyngeal edema. Her uvula is midline without edema. She denies any difficulty breathing or swallowing her secretions. We'll provide the patient is Solu-Medrol and Benadryl in the ED. I advised the patient beforehand of the risk of Benadryl passing along her breast milk. Advised her to pump and dump her breast on for 12 hours after her last dose of Benadryl. The patient agrees. At second revaluation, over two hours after her initial evaluation, the patient reports that she no longer feels like her tongue is swelling. She feels back to normal.  She feels ready for discharge. She denies any difficulty swallowing or breathing. Advised patient to stop taking her new ferrous sulfate pills and to adhere to an iron rich diet. Advised to follow-up with her OB/GYN this week. I advised the patient to follow-up with their primary care provider this week. Strict return precautions provided. I advised the patient to return to the emergency department with new or worsening symptoms or new concerns. The patient verbalized understanding and agreement with plan.    This patient was discussed with Dr. Norlene Campbelltter who agrees with assessment and plan.     Everlene FarrierWilliam Fionn Stracke, PA-C 09/05/14 [id number]  Marisa Severinlga Otter, MD 09/05/14 [id number]

## 2014-10-10 ENCOUNTER — Ambulatory Visit: Payer: Medicaid Other | Admitting: Family Medicine

## 2014-10-16 ENCOUNTER — Emergency Department (HOSPITAL_COMMUNITY)
Admission: EM | Admit: 2014-10-16 | Discharge: 2014-10-16 | Disposition: A | Discharge status: Home / Self Care | Payer: Medicaid Other | Attending: Emergency Medicine | Admitting: Emergency Medicine

## 2014-10-16 ENCOUNTER — Encounter (HOSPITAL_COMMUNITY): Payer: Self-pay | Admitting: *Deleted

## 2014-10-16 DIAGNOSIS — Z79899 Other long term (current) drug therapy: Secondary | ICD-10-CM | POA: Insufficient documentation

## 2014-10-16 DIAGNOSIS — X58XXXA Exposure to other specified factors, initial encounter: Secondary | ICD-10-CM | POA: Diagnosis not present

## 2014-10-16 DIAGNOSIS — E669 Obesity, unspecified: Secondary | ICD-10-CM | POA: Insufficient documentation

## 2014-10-16 DIAGNOSIS — Y998 Other external cause status: Secondary | ICD-10-CM | POA: Diagnosis not present

## 2014-10-16 DIAGNOSIS — Y9389 Activity, other specified: Secondary | ICD-10-CM | POA: Insufficient documentation

## 2014-10-16 DIAGNOSIS — R220 Localized swelling, mass and lump, head: Secondary | ICD-10-CM | POA: Diagnosis present

## 2014-10-16 DIAGNOSIS — Y9289 Other specified places as the place of occurrence of the external cause: Secondary | ICD-10-CM | POA: Diagnosis not present

## 2014-10-16 DIAGNOSIS — T783XXA Angioneurotic edema, initial encounter: Secondary | ICD-10-CM

## 2014-10-16 MED ORDER — PREDNISONE 20 MG PO TABS
60.0000 mg | ORAL_TABLET | Freq: Once | ORAL | Status: AC
  Administered 2014-10-16: 60 mg via ORAL
  Filled 2014-10-16: qty 3

## 2014-10-16 MED ORDER — EPINEPHRINE 0.3 MG/0.3ML IJ SOAJ: 0.3000 mg | Freq: Once | INTRAMUSCULAR | Status: DC

## 2014-10-16 MED ORDER — PREDNISONE 10 MG PO TABS: 20.0000 mg | ORAL_TABLET | Freq: Every day | ORAL | Status: DC

## 2014-10-16 MED ORDER — DIPHENHYDRAMINE HCL 25 MG PO CAPS
50.0000 mg | ORAL_CAPSULE | Freq: Once | ORAL | Status: AC
  Administered 2014-10-16: 50 mg via ORAL
  Filled 2014-10-16: qty 2

## 2014-10-16 NOTE — ED Notes (Signed)
Pt c/o swelling to face since 10a this morning. States she has taken benadryl with no relief.  States that [place] went to [employer] yesterday and ate some different foods. Also states that she flossed yesterday and that could have caused some irritation. Denies allergy to foods. Denies difficulty breathing or throat swelling.

## 2014-10-16 NOTE — Discharge Instructions (Signed)
Angioedema °Angioedema is sudden puffiness (swelling), often of the skin. It can happen: °· On your face or privates (genitals). °· In your belly (abdomen) or other body parts. °It usually happens quickly and gets better in 1 or 2 days. It often starts at night and is found when you wake up. You may get red, itchy patches of skin (hives). Attacks can be dangerous if your breathing passages get puffy. °The condition may happen only once, or it can come back at random times. It may happen for several years before it goes away for good. °HOME CARE °· Only take medicines as told by your doctor. °· Always carry your emergency allergy medicines with you. °· Wear a medical bracelet as told by your doctor. °· Avoid things that you know will cause attacks (triggers). °GET HELP IF: °· You have another attack. °· Your attacks happen more often or get worse. °· The condition was passed to you by your parents and you want to have children. °GET HELP RIGHT AWAY IF:  °· Your mouth, tongue, or lips are very puffy. °· You have trouble breathing. °· You have trouble swallowing. °· You pass out (faint). °MAKE SURE YOU:  °· Understand these instructions. °· Will watch your condition. °· Will get help right away if you are not doing well or get worse. °Document Released: 03/19/2009 Document Revised: 01/19/2013 Document Reviewed: 11/22/2012 °[employer]® Patient Information ©2015 [employer]. This information is not intended to replace advice given to you by your health care provider. Make sure you discuss any questions you have with your health care provider. ° °

## 2014-10-16 NOTE — ED Notes (Addendum)
Facial swelling appears to be improving. MD made aware.

## 2014-10-16 NOTE — ED Notes (Signed)
MD at bedside to reassess pt

## 2014-10-16 NOTE — ED Provider Notes (Signed)
CSN: [id number]     Arrival date & time 10/16/14  1525 History   First MD Initiated Contact with Patient 10/16/14 1553     No chief complaint on file.    (Consider location/radiation/quality/duration/timing/severity/associated sxs/prior Treatment) HPI 29 year old female who comes in today complaining of some swelling to the left side of her mouth that began on awakening this morning. She states that she had shrimp last night which is unusual for her. She did not like the taste of it and spit it out. She has not had any previous allergic reaction to shrimp or other food. Began immediately. She has noted this swelling to her face throughout the day and took some Benadryl. It has not significantly worsened. She is not having any difficulty breathing, swallowing, speaking, no nausea, vomiting, or diarrhea. Does not noted any rash. Past Medical History  Diagnosis Date  . 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 Obesity   . Vaginal Pap smear, abnormal    Past Surgical History  Procedure Laterality Date  . Colposcopy    . Colostomy    . Axilla abscess     Family History  Problem Relation Age of Onset  . Diabetes Mother   . Hypertension Mother   . Diabetes Maternal Grandmother   . Anesthesia problems Neg Hx    History  Substance Use Topics  . Smoking status: Never Smoker   . Smokeless tobacco: Never Used  . Alcohol Use: No   OB History    Gravida Para Term Preterm AB TAB SAB Ectopic Multiple Living   0 2     Review of Systems  All other systems reviewed and are negative.     Allergies  Ferrous sulfate and Shellfish allergy  Home Medications   Prior to Admission medications   Medication Sig Start Date End Date Taking? Authorizing Provider  ibuprofen (ADVIL,MOTRIN) 600 MG tablet Take 1 tablet (600 mg total) by mouth every 6 (six) hours. 09/02/14  Yes Smitty Cords, DO  Prenatal Vit-Fe Fumarate-FA  (PRENATAL MULTIVITAMIN) TABS tablet Take 1 tablet by mouth daily at 12 noon.   Yes Historical Provider, MD  docusate sodium (COLACE) 100 MG capsule Take 1 capsule (100 mg total) by mouth 2 (two) times daily. Patient not taking: Reported on 10/16/2014 09/02/14   Smitty Cords, DO  ferrous sulfate 325 (65 FE) MG tablet Take 1 tablet (325 mg total) by mouth daily with breakfast. Patient not taking: Reported on 10/16/2014 09/02/14   Netta Neat Karamalegos, DO   BP 114/79 mmHg  Pulse 82  Temp(Src) 98.9 F (37.2 C) (Oral)  Resp 18  Ht  (1.651 m)  Wt 205 lb 6 oz (93.157 kg)  BMI 34.18 kg/m2  SpO2 98% Physical Exam  Constitutional: She is oriented to person, place, and time. She appears well-developed and well-nourished.  HENT:  Head: Normocephalic and atraumatic.    Right Ear: External ear normal.  Left Ear: External ear normal.  Nose: Nose normal.  Mouth/Throat: Oropharynx is clear and moist.  Swelling noted to the lateral lower left lip and lateral upper left side of foot. No swelling noted in the mouth or oropharynx. Tongue is normal. Floor of mouth is normal. No swelling to the neck. Neck supple.  Eyes: Conjunctivae and EOM are normal. Pupils are equal, round, and reactive to light.  Neck: Normal range of motion. Neck supple.  Cardiovascular:  Normal rate, regular rhythm, normal heart sounds and intact distal pulses.   Pulmonary/Chest: Effort normal and breath sounds normal.  Abdominal: Soft. Bowel sounds are normal.  Musculoskeletal: Normal range of motion. She exhibits no edema.  Neurological: She is alert and oriented to person, place, and time. She has normal reflexes.  Skin: No rash noted.  Nursing note and vitals reviewed.   ED Course  Procedures (including critical care time) Labs Review Labs Reviewed - No data to display  Imaging Review No results found.   EKG Interpretation None      MDM   Final diagnoses:  Angioedema, initial encounter     Patient with angioedema of mouth with uncertain certain cause. She is not on any ACE inhibitor's. Patient treated here with Benadryl and prednisone. She has been observed for 2 hours and 40 minutes. Her swelling is decreasing. I have discussed with her return precautions especially worsening swelling, dyspnea, or lightheadedness. Plan to give her a prescription for epinephrine. She understands if she has any return of symptoms as that time she should use an epinephrine and immediately to the hospital. She is advised follow-up with primary care physician for referral to allergist.   Margarita Grizzleanielle Laneice Meneely, MD 10/17/14 1226

## 2014-10-17 ENCOUNTER — Encounter: Payer: Self-pay | Admitting: Family Medicine

## 2014-10-17 ENCOUNTER — Ambulatory Visit (INDEPENDENT_AMBULATORY_CARE_PROVIDER_SITE_OTHER): Payer: Medicaid Other | Admitting: Family Medicine

## 2014-10-17 VITALS — BP 128/90 | HR 78 | Ht 65.0 in | Wt 205.0 lb

## 2014-10-17 DIAGNOSIS — T783XXD Angioneurotic edema, subsequent encounter: Secondary | ICD-10-CM

## 2014-10-17 DIAGNOSIS — T783XXA Angioneurotic edema, initial encounter: Secondary | ICD-10-CM | POA: Insufficient documentation

## 2014-10-17 DIAGNOSIS — Z30011 Encounter for initial prescription of contraceptive pills: Secondary | ICD-10-CM

## 2014-10-17 MED ORDER — NORETHINDRONE-ETH ESTRADIOL 0.5-35 MG-MCG PO TABS: 1.0000 | ORAL_TABLET | Freq: Every day | ORAL | Status: DC

## 2014-10-17 NOTE — Progress Notes (Signed)
  Subjective:     Barbara Waller is a 29 y.o. female who presents for a postpartum visit. She is 6 weeks postpartum following a spontaneous vaginal delivery. I have fully reviewed the prenatal and intrapartum course. The delivery was at 40 5/[redacted] wks gestational weeks. Outcome: spontaneous vaginal delivery. Anesthesia: epidural. Postpartum course has been remarkable for 2 episodes of angioedema requiring trips to the ED and steroids. Baby's course has been normal. Baby is feeding by bottle - Similac with Iron. Bleeding staining only. Bowel function is normal. Bladder function is normal. Patient is not sexually active. Contraception method is OCP (estrogen/progesterone). Postpartum depression screening: negative.  The following portions of the patient's history were reviewed and updated as appropriate: allergies, current medications, past family history, past medical history, past social history, past surgical history and problem list.  Review of Systems Pertinent items are noted in HPI.   Objective:    BP 128/90 mmHg  Pulse 78  Ht 5\' 5"  (1.651 m)  Wt 205 lb (92.987 kg)  BMI 34.11 kg/m2  LMP 10/09/2014  Breastfeeding? No  General:  alert, cooperative and appears stated age   Vulva:  normal  Vagina: normal vagina, no discharge, exudate, lesion, or erythema  Cervix:  multiparous appearance and no cervical motion tenderness  Corpus: normal size, contour, position, consistency, mobility, non-tender  Adnexa:  normal adnexa        Assessment:     Normal postpartum exam. Pap smear not done at today's visit.   Plan:    Pap due 2018 .    Problem List Items Addressed This Visit      Unprioritized   Angioedema of lips - Primary   Relevant Orders   Ambulatory referral to Allergy    Other Visit Diagnoses    Encounter for initial prescription of contraceptive pills        Relevant Medications    norethindrone-ethinyl estradiol (BREVICON, 28,) 0.5-35 MG-MCG tablet    Routine postpartum  follow-up          3. Follow up in: 1 year or as needed

## 2014-10-17 NOTE — Progress Notes (Signed)
Patient ID: Barbara Waller, female   DOB: 04/13/1986, 29 y.o.   MRN: [id number] Here today for postpartum visit.  Had allergic reaction yesterday and [facility].  Needs referral to allergist per ER MD. No other concerns.  Needs Rx for Brevicon BC.  Husband is considering vasectomy.

## 2014-12-01 ENCOUNTER — Encounter (HOSPITAL_COMMUNITY): Payer: Self-pay | Admitting: Emergency Medicine

## 2014-12-01 ENCOUNTER — Emergency Department (HOSPITAL_COMMUNITY)
Admission: EM | Admit: 2014-12-01 | Discharge: 2014-12-01 | Disposition: A | Discharge status: Home / Self Care | Payer: Medicaid Other | Attending: Emergency Medicine | Admitting: Emergency Medicine

## 2014-12-01 DIAGNOSIS — E669 Obesity, unspecified: Secondary | ICD-10-CM | POA: Diagnosis not present

## 2014-12-01 DIAGNOSIS — T783XXA Angioneurotic edema, initial encounter: Secondary | ICD-10-CM | POA: Insufficient documentation

## 2014-12-01 DIAGNOSIS — Y929 Unspecified place or not applicable: Secondary | ICD-10-CM | POA: Diagnosis not present

## 2014-12-01 DIAGNOSIS — Y998 Other external cause status: Secondary | ICD-10-CM | POA: Insufficient documentation

## 2014-12-01 DIAGNOSIS — Y9389 Activity, other specified: Secondary | ICD-10-CM | POA: Insufficient documentation

## 2014-12-01 DIAGNOSIS — Z79899 Other long term (current) drug therapy: Secondary | ICD-10-CM | POA: Insufficient documentation

## 2014-12-01 DIAGNOSIS — X58XXXA Exposure to other specified factors, initial encounter: Secondary | ICD-10-CM | POA: Insufficient documentation

## 2014-12-01 MED ORDER — PREDNISONE 50 MG PO TABS: 50.0000 mg | ORAL_TABLET | Freq: Every day | ORAL | Status: DC

## 2014-12-01 MED ORDER — DIPHENHYDRAMINE HCL 50 MG/ML IJ SOLN
25.0000 mg | Freq: Once | INTRAMUSCULAR | Status: AC
  Administered 2014-12-01: 25 mg via INTRAVENOUS
  Filled 2014-12-01: qty 1

## 2014-12-01 MED ORDER — EPINEPHRINE 0.3 MG/0.3ML IJ SOAJ: 0.3000 mg | Freq: Once | INTRAMUSCULAR | Status: DC

## 2014-12-01 MED ORDER — METHYLPREDNISOLONE SODIUM SUCC 125 MG IJ SOLR
125.0000 mg | Freq: Once | INTRAMUSCULAR | Status: AC
  Administered 2014-12-01: 125 mg via INTRAVENOUS
  Filled 2014-12-01: qty 2

## 2014-12-01 NOTE — ED Notes (Signed)
Dr. Linker at bedside  

## 2014-12-01 NOTE — Discharge Instructions (Signed)
Return to the ED with any concerns including lips or tongue swelling, difficulty breathing, fainting, decreased level of alertness/lethargy, or any other alarming symptoms

## 2014-12-01 NOTE — ED Provider Notes (Signed)
CSN: [id number]     Arrival date & time 12/01/14  1010 History   First MD Initiated Contact with Patient 12/01/14 1016     Chief Complaint  Patient presents with  . Allergic Reaction     (Consider location/radiation/quality/duration/timing/severity/associated sxs/prior Treatment) HPI  Pt presenting with c/o lip swelling.  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  She does not know of any new or different foods that she has eaten. No difficulty breathing, no tongue swelling.  She states she took her epipen this morning at 9am and lip swelling has improved since then.  She has seen her doctor and allergist and was going for labwork today but with lip swelling came to the ED instead.  There are no other associated systemic symptoms, there are no other alleviating or modifying factors.   Past Medical History  Diagnosis Date  . 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 Obesity   . Vaginal Pap smear, abnormal    Past Surgical History  Procedure Laterality Date  . Colposcopy    . Colostomy    . Axilla abscess     Family History  Problem Relation Age of Onset  . Diabetes Mother   . Hypertension Mother   . Diabetes Maternal Grandmother   . Anesthesia problems Neg Hx    Social History  Substance Use Topics  . Smoking status: Never Smoker   . Smokeless tobacco: Never Used  . Alcohol Use: No   OB History    Gravida Para Term Preterm AB TAB SAB Ectopic Multiple Living   2 2 2       0 2     Review of Systems  ROS reviewed and all otherwise negative except for mentioned in HPI    Allergies  Ferrous sulfate and Shellfish allergy  Home Medications   Prior to Admission medications   Medication Sig Start Date End Date Taking? Authorizing Provider  acetaminophen (TYLENOL) 325 MG tablet Take 650 mg by mouth every 6 (six) hours as needed for headache.   Yes Historical Provider, MD  norethindrone-ethinyl estradiol (BREVICON, 28,)  0.5-35 MG-MCG tablet Take 1 tablet by mouth daily. 10/17/14  Yes Reva Bores, MD  EPINEPHrine (EPIPEN 2-PAK) 0.3 mg/0.3 mL IJ SOAJ injection Inject 0.3 mLs (0.3 mg total) into the muscle once. 12/01/14   Jerelyn Scott, MD  ibuprofen (ADVIL,MOTRIN) 600 MG tablet Take 1 tablet (600 mg total) by mouth every 6 (six) hours. Patient not taking: Reported on 12/01/2014 09/02/14   Smitty Cords, DO  predniSONE (DELTASONE) 50 MG tablet Take 1 tablet (50 mg total) by mouth daily. 12/01/14   Jerelyn Scott, MD   BP 118/95 mmHg  Pulse 89  Temp(Src) 98.7 F (37.1 C) (Oral)  Resp 20  SpO2 100%  Vitals reviewed Physical Exam  Physical Examination: General appearance - alert, well appearing, and in no distress Mental status - alert, oriented to person, place, and time Eyes - no conjunctival injection, no scleral icterus Mouth - mucous membranes moist, pharynx normal without lesions, mild swelling to right side of lower lip, no tongue swelling, no swelling under tongue Neck - supple, no significant adenopathy Chest - clear to auscultation, no wheezes, rales or rhonchi, symmetric air entry Heart - normal rate, regular rhythm, normal S1, S2, no murmurs, rubs, clicks or gallops Abdomen - soft, nontender, nondistended, no masses or organomegaly Neurological - alert, oriented, normal speech, Extremities - peripheral pulses normal,  no pedal edema, no clubbing or cyanosis Skin - normal coloration and turgor, no rashes  ED Course  Procedures (including critical care time) Labs Review Labs Reviewed - No data to display  Imaging Review No results found. I have personally reviewed and evaluated these images and lab results as part of my medical decision-making.   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  She does not take ace inhibitors, no new meds or foods.  Pt  injected her epi pen at 9am.  She has been treated with solumedrol and benadryl, observed for total of 4 hours after epinephrine- lip is continuing to improve.  Discharged with strict return precautions.  Pt agreeable with plan.    Jerelyn Scott, MD 12/02/14 [phone]

## 2014-12-01 NOTE — ED Notes (Signed)
Patient coming from home with c/o of swelling to the right side only of the lip.  Patient noticed a clear bump to the inside of her mouth at the right lip.  Patient has had 2 prior reactions of unknown cause the first after her son was born in May and again July 4th.  Patient took her Epi Pen today at 09:00.

## 2014-12-11 ENCOUNTER — Emergency Department (HOSPITAL_COMMUNITY)
Admission: EM | Admit: 2014-12-11 | Discharge: 2014-12-11 | Disposition: A | Discharge status: Home / Self Care | Payer: Medicaid Other | Attending: Emergency Medicine | Admitting: Emergency Medicine

## 2014-12-11 ENCOUNTER — Encounter (HOSPITAL_COMMUNITY): Payer: Self-pay | Admitting: Emergency Medicine

## 2014-12-11 DIAGNOSIS — Z79899 Other long term (current) drug therapy: Secondary | ICD-10-CM | POA: Diagnosis not present

## 2014-12-11 DIAGNOSIS — R220 Localized swelling, mass and lump, head: Secondary | ICD-10-CM

## 2014-12-11 DIAGNOSIS — E669 Obesity, unspecified: Secondary | ICD-10-CM | POA: Diagnosis not present

## 2014-12-11 DIAGNOSIS — R600 Localized edema: Secondary | ICD-10-CM | POA: Insufficient documentation

## 2014-12-11 MED ORDER — PREDNISONE 20 MG PO TABS: 60.0000 mg | ORAL_TABLET | Freq: Every day | ORAL | Status: AC

## 2014-12-11 MED ORDER — RANITIDINE HCL 150 MG/10ML PO SYRP
150.0000 mg | ORAL_SOLUTION | Freq: Once | ORAL | Status: AC
  Administered 2014-12-11: 150 mg via ORAL
  Filled 2014-12-11 (×2): qty 10

## 2014-12-11 MED ORDER — PREDNISONE 20 MG PO TABS
60.0000 mg | ORAL_TABLET | Freq: Once | ORAL | Status: AC
  Administered 2014-12-11: 60 mg via ORAL
  Filled 2014-12-11: qty 3

## 2014-12-11 NOTE — ED Provider Notes (Signed)
CSN: [id number]     Arrival date & time 12/11/14  4098 History   First MD Initiated Contact with Patient 12/11/14 0930     Chief Complaint  Patient presents with  . Oral Swelling   Patient is a 29 y.o. female presenting with general illness. The history is provided by the patient. No language interpreter was used.  Illness Location:  Lips Quality:  Swelling Severity:  Moderate Onset quality:  Sudden Timing:  Constant Progression:  Partially resolved Chronicity:  Recurrent Context:  Hx of facial swelling presenting with swelling of her upper lip. Patient states that when she woke this morning at 6:00am she noticed lip swelling at which time she administered an epinephrine pen. Denies pruritis, rash, tongue swelling, SOB, difficulty breathing or swallowing. Patient then took Benadryl 25mg  PO at 7:30am. Patient is feeling better but presented to the ED per prior epi pen administration recommendations. Associated symptoms: no abdominal pain, no chest pain, no congestion, no cough, no diarrhea, no fever, no headaches, no loss of consciousness, no nausea, no rash, no rhinorrhea, no shortness of breath, no sore throat, no vomiting and no wheezing     Past Medical History  Diagnosis Date  . 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 Obesity   . Vaginal Pap smear, abnormal    Past Surgical History  Procedure Laterality Date  . Colposcopy    . Colostomy    . Axilla abscess     Family History  Problem Relation Age of Onset  . Diabetes Mother   . Hypertension Mother   . Diabetes Maternal Grandmother   . Anesthesia problems Neg Hx    Social History  Substance Use Topics  . Smoking status: Never Smoker   . Smokeless tobacco: Never Used  . Alcohol Use: No   OB History    Gravida Para Term Preterm AB TAB SAB Ectopic Multiple Living   2 2 2       0 2      Review of Systems  Constitutional: Negative for fever.  HENT: Positive for  facial swelling. Negative for congestion, rhinorrhea and sore throat.   Respiratory: Negative for cough, shortness of breath and wheezing.   Cardiovascular: Negative for chest pain.  Gastrointestinal: Negative for nausea, vomiting, abdominal pain and diarrhea.  Skin: Negative for rash.  Allergic/Immunologic: Negative for environmental allergies, food allergies and immunocompromised state.  Neurological: Negative for loss of consciousness and headaches.  All other systems reviewed and are negative.   Allergies  Ferrous sulfate and Shellfish allergy  Home Medications   Prior to Admission medications   Medication Sig Start Date End Date Taking? Authorizing Provider  acetaminophen (TYLENOL) 325 MG tablet Take 650 mg by mouth every 6 (six) hours as needed for headache.    Historical Provider, MD  EPINEPHrine (EPIPEN 2-PAK) 0.3 mg/0.3 mL IJ SOAJ injection Inject 0.3 mLs (0.3 mg total) into the muscle once. 12/01/14   Jerelyn Scott, MD  ibuprofen (ADVIL,MOTRIN) 600 MG tablet Take 1 tablet (600 mg total) by mouth every 6 (six) hours. Patient not taking: Reported on 12/01/2014 09/02/14   Smitty Cords, DO  norethindrone-ethinyl estradiol (BREVICON, 28,) 0.5-35 MG-MCG tablet Take 1 tablet by mouth daily. 10/17/14   Reva Bores, MD  predniSONE (DELTASONE) 20 MG tablet Take 3 tablets (60 mg total) by mouth daily. 12/11/14 12/15/14  Angelina Ok, MD   BP 130/86 mmHg  Pulse 77  Temp(Src) 98.8 F (37.1  C) (Oral)  Resp 18  Ht  (1.651 m)  SpO2 99%  LMP 12/11/2014 (Exact Date)   Physical Exam  Constitutional: She is oriented to person, place, and time. She appears well-developed. No distress.  HENT:  Head: Normocephalic and atraumatic.  Posterior oropharynx clear without evidence of edema or erythema  Eyes: Conjunctivae are normal. Pupils are equal, round, and reactive to light.  Neck: Normal range of motion. Neck supple.  Cardiovascular: Normal rate and normal heart sounds.    Pulmonary/Chest: Effort normal. No respiratory distress. She has no wheezes.  Abdominal: Soft. Bowel sounds are normal.  Musculoskeletal: Normal range of motion.  Neurological: She is alert and oriented to person, place, and time.  Skin: She is not diaphoretic.    ED Course  Procedures   Labs Review Labs Reviewed - No data to display  Imaging Review No results found. I have personally reviewed and evaluated these images and lab results as part of my medical decision-making.   EKG Interpretation None      MDM  Ms. Schoenberger is a 29 yo female with previous Hx of facial swelling presenting with swelling of her upper lip. Patient states that when she awoke this morning at 6:00am she noticed uper lip swelling at which time she administered an epinephrine pen. Denies pruritis, rash, N/V/D, tongue swelling, SOB, difficulty breathing or swallowing. Patient then took Benadryl  PO at 7:30am. Patient is feeling better but presented to the ED per prior epi pen administration recommendations.  Physical exam above notable for young female lying in bed in NAD. Afebrile. Not tachycardic. Breathing well on room air and maintaining saturations without supplemental O2. Normotensive. Lungs CTAB without evidence of wheezing. Posterior oropharynx is clear without evidence of erythema or edema. Mild swelling noted to upper lip without evidence of lingular swelling. Remainder of examination is benign.  Patient given prednisone 60 mg by mouth as well as Zantac 150 milligrams by mouth. Patient was observed in the emergency department for several hours without any evidence of rebound swelling.  Patient discharged home in stable condition with a prescription for prednisone burst. Patient has Benadryl and an additional EpiPen at home. Patient is currently being followed by an allergist and has an appointment to have blood drawn today for further evaluation a previous history of swelling. Patient understands and  agrees with plan and has no further questions or concerns at this time.  Patient care discussed with followed by my attending Dr. Gwendolyn Grant.   Final diagnoses:  Swelling of upper lip    Angelina Ok, MD 12/11/14 1733  Elwin Mocha, MD 12/12/14 [phone]

## 2014-12-11 NOTE — ED Notes (Signed)
Pt stated she ate tuna last night on 12/10/2014 around 9pm and has swelling of her top lip, as a precaution she took dose from her epi pen around 6am the following morning, followed by a Benadryl at 710am.  She reports no SOB or any other symptoms of anaphylaxis, states she "just came in for a checkup"

## 2015-02-18 ENCOUNTER — Emergency Department (HOSPITAL_COMMUNITY)
Admission: EM | Admit: 2015-02-18 | Discharge: 2015-02-18 | Disposition: A | Discharge status: Home / Self Care | Payer: Medicaid Other | Attending: Emergency Medicine | Admitting: Emergency Medicine

## 2015-02-18 ENCOUNTER — Encounter (HOSPITAL_COMMUNITY): Payer: Self-pay | Admitting: Emergency Medicine

## 2015-02-18 DIAGNOSIS — R220 Localized swelling, mass and lump, head: Secondary | ICD-10-CM | POA: Insufficient documentation

## 2015-02-18 DIAGNOSIS — Z79818 Long term (current) use of other agents affecting estrogen receptors and estrogen levels: Secondary | ICD-10-CM | POA: Insufficient documentation

## 2015-02-18 DIAGNOSIS — E669 Obesity, unspecified: Secondary | ICD-10-CM | POA: Insufficient documentation

## 2015-02-18 DIAGNOSIS — Z8742 Personal history of other diseases of the female genital tract: Secondary | ICD-10-CM | POA: Insufficient documentation

## 2015-02-18 DIAGNOSIS — R51 Headache: Secondary | ICD-10-CM | POA: Insufficient documentation

## 2015-02-18 MED ORDER — DIPHENHYDRAMINE HCL 25 MG PO TABS: 25.0000 mg | ORAL_TABLET | Freq: Four times a day (QID) | ORAL | Status: DC

## 2015-02-18 MED ORDER — PREDNISONE 20 MG PO TABS
60.0000 mg | ORAL_TABLET | Freq: Once | ORAL | Status: AC
  Administered 2015-02-18: 60 mg via ORAL
  Filled 2015-02-18: qty 3

## 2015-02-18 MED ORDER — FAMOTIDINE 20 MG PO TABS: 20.0000 mg | ORAL_TABLET | Freq: Two times a day (BID) | ORAL | Status: DC

## 2015-02-18 MED ORDER — PREDNISONE 20 MG PO TABS: ORAL_TABLET | ORAL | Status: DC

## 2015-02-18 NOTE — ED Notes (Signed)
Pt. Stated, Weve been moving and I think Im allergic to the dust.  Pt has swollen right jaw area swollen

## 2015-02-18 NOTE — Discharge Instructions (Signed)
Angioedema  Angioedema is a sudden swelling of tissues, often of the skin. It can occur on the face or genitals or in the abdomen or other body parts. The swelling usually develops over a short period and gets better in 24 to 48 hours. It often begins during the night and is found when the person wakes up. The person may also get red, itchy patches of skin (hives). Angioedema can be dangerous if it involves swelling of the air passages.   Depending on the cause, episodes of angioedema may only happen once, come back in unpredictable patterns, or repeat for several years and then gradually fade away.   CAUSES   Angioedema can be caused by an allergic reaction to various triggers. It can also result from nonallergic causes, including reactions to drugs, immune system disorders, viral infections, or an abnormal gene that is passed to you from your parents (hereditary). For some people with angioedema, the cause is unknown.   Some things that can trigger angioedema include:    Foods.    Medicines, such as ACE inhibitors, ARBs, nonsteroidal anti-inflammatory agents, or estrogen.    Latex.    Animal saliva.    Insect stings.    Dyes used in X-rays.    Mild injury.    Dental work.   Surgery.   Stress.    Sudden changes in temperature.    Exercise.  SIGNS AND SYMPTOMS    Swelling of the skin.   Hives. If these are present, there is also intense itching.   Redness in the affected area.    Pain in the affected area.   Swollen lips or tongue.   Breathing problems. This may happen if the air passages swell.   Wheezing.  If internal organs are involved, there may be:    Nausea.    Abdominal pain.    Vomiting.    Difficulty swallowing.    Difficulty passing urine.  DIAGNOSIS    Your health care provider will examine the affected area and take a medical and family history.   Various tests may be done to help determine the cause. Tests may include:   Allergy skin tests to see if the problem  is an allergic reaction.    Blood tests to check for hereditary angioedema.    Tests to check for underlying diseases that could cause the condition.    A review of your medicines, including over-the-counter medicines, may be done.  TREATMENT   Treatment will depend on the cause of the angioedema. Possible treatments include:    Removal of anything that triggered the condition (such as stopping certain medicines).    Medicines to treat symptoms or prevent attacks. Medicines given may include:     Antihistamines.     Epinephrine injection.     Steroids.    Hospitalization may be required for severe attacks. If the air passages are affected, it can be an emergency. Tubes may need to be placed to keep the airway open.  HOME CARE INSTRUCTIONS    Take all medicines as directed by your health care provider.   If you were given medicines for emergency allergy treatment, always carry them with you.   Wear a medical bracelet as directed by your health care provider.    Avoid known triggers.  SEEK MEDICAL CARE IF:    You have repeat attacks of angioedema.    Your attacks are more frequent or more severe despite preventive measures.    You have hereditary angioedema   and are considering having children. It is important to discuss with your health care provider the risks of passing the condition on to your children.  SEEK IMMEDIATE MEDICAL CARE IF:    You have severe swelling of the mouth, tongue, or lips.   You have difficulty breathing.    You have difficulty swallowing.    You faint.  MAKE SURE YOU:   Understand these instructions.   Will watch your condition.   Will get help right away if you are not doing well or get worse.     This information is not intended to replace advice given to you by your health care provider. Make sure you discuss any questions you have with your health care provider.     Document Released: 06/09/2001 Document Revised: 04/21/2014 Document Reviewed:  11/22/2012  [employer] Interactive Patient Education 2016 [employer]

## 2015-02-18 NOTE — ED Provider Notes (Signed)
CSN: [id number]     Arrival date & time 02/18/15  0820 History  By signing my name below, I, Barbara Waller, attest that this documentation has been prepared under the direction and in the presence of Fayrene Helper, PA-C. Electronically Signed: Octavia Waller, ED Scribe. 02/18/2015. 9:14 AM.    Chief Complaint  Patient presents with  . Allergic Reaction     The history is provided by the patient. No language interpreter was used.   HPI Comments Barbara Waller is a 29 y.o. female who presents to the Emergency Department complaining of  Pt reports waking up this morning with swelling to the right side of face near her lower jaw. Swelling felt similar to prior angioedema from dust mites she has in the past.  Pt states she has been under a lot of stress and she is unsure what triggered her reaction. Pt states he had a moderate sudden onset headache that was different than her normal headache that she described as a pinching sensation. Pt notes she has not take any medication to alleviate the pain. Pt recently moved into a new house that has just been built and she notes that could be the reason why due to new chemicals and dust. She denies itching, throat swelling, tongue swelling, trouble swallowing, rash, neck pain, chest pain, trouble breathing, and abdominal cramping.  Past Medical History  Diagnosis Date  . 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 Obesity   . Vaginal Pap smear, abnormal    Past Surgical History  Procedure Laterality Date  . Colposcopy    . Colostomy    . Axilla abscess     Family History  Problem Relation Age of Onset  . Diabetes Mother   . Hypertension Mother   . Diabetes Maternal Grandmother   . Anesthesia problems Neg Hx    Social History  Substance Use Topics  . Smoking status: Never Smoker   . Smokeless tobacco: Never Used  . Alcohol Use: No   OB History    Gravida Para Term Preterm AB TAB SAB Ectopic  Multiple Living   0 2     Review of Systems  HENT: Positive for facial swelling. Negative for trouble swallowing.   Respiratory: Negative for shortness of breath and wheezing.   Cardiovascular: Negative for chest pain.  Gastrointestinal: Negative for abdominal pain.  Musculoskeletal: Negative for neck pain.  Skin: Negative for rash.      Allergies  Ferrous sulfate and Shellfish allergy  Home Medications   Prior to Admission medications   Medication Sig Start Date End Date Taking? Authorizing Provider  acetaminophen (TYLENOL) 325 MG tablet Take 650 mg by mouth every 6 (six) hours as needed for headache.    Historical Provider, MD  EPINEPHrine (EPIPEN 2-PAK) 0.3 mg/0.3 mL IJ SOAJ injection Inject 0.3 mLs (0.3 mg total) into the muscle once. 12/01/14   Jerelyn Scott, MD  ibuprofen (ADVIL,MOTRIN) 600 MG tablet Take 1 tablet (600 mg total) by mouth every 6 (six) hours. Patient not taking: Reported on 12/01/2014 09/02/14   Smitty Cords, DO  norethindrone-ethinyl estradiol (BREVICON, 28,) 0.5-35 MG-MCG tablet Take 1 tablet by mouth daily. 10/17/14   Reva Bores, MD   Triage vitals: BP 111/76 mmHg  Pulse 76  Temp(Src) 98.6 F (37 C) (Oral)  Resp 16  Ht  (1.676 m)  Wt 209 lb 9 oz (95.057 kg)  BMI 33.84 kg/m2  SpO2 100%  LMP 02/04/2015 Physical Exam  Constitutional: She appears well-developed and well-nourished. No distress.  HENT:  Head: Normocephalic and atraumatic.  Oral mucosa normal and moist, uvula midline, no tonsillar enlargement or exudates. right sided facial edema noted with mild erythema along the jaw line, right pre-auricular lymphadenopathy, cerumen impaction in left and right ear unable to visualize TM's  Eyes: Right eye exhibits no discharge. Left eye exhibits no discharge.  Pulmonary/Chest: Effort normal. No respiratory distress. She has no wheezes.  Abdominal: Soft.  Neurological: She is alert. Coordination normal.  Skin: No rash noted.  She is not diaphoretic.  Psychiatric: She has a normal mood and affect. Her behavior is normal.  Nursing note and vitals reviewed.   ED Course  Procedures  DIAGNOSTIC STUDIES: Oxygen Saturation is 100% on RA, normal by my interpretation.  COORDINATION OF CARE:  9:13 AM mild facial swelling, likely allergic reaction, unknown sources.  No anaphylaxis. No airway compromise.  Discussed treatment plan which includes prednisone with pt at bedside and pt agreed to plan.    MDM   Final diagnoses:  Right facial swelling   BP 111/76 mmHg  Pulse 76  Temp(Src) 98.6 F (37 C) (Oral)  Resp 16  Ht 5\' 6"  (1.676 m)  Wt 209 lb 9 oz (95.057 kg)  BMI 33.84 kg/m2  SpO2 100%  LMP 02/04/2015  I personally performed the services described in this documentation, which was scribed in my presence. The recorded information has been reviewed and is accurate.     Fayrene HelperBowie Fredia Chittenden, PA-C 02/18/15 [id number]  Eber HongBrian Miller, MD 02/18/15 2043

## 2015-02-18 NOTE — ED Notes (Signed)
Declined W/C at D/C and was escorted to lobby by RN. 

## 2015-02-18 NOTE — ED Notes (Signed)
No swallowing difficulties.

## 2015-05-02 ENCOUNTER — Emergency Department (INDEPENDENT_AMBULATORY_CARE_PROVIDER_SITE_OTHER)
Admission: EM | Admit: 2015-05-02 | Discharge: 2015-05-02 | Disposition: A | Discharge status: Home / Self Care | Payer: Self-pay | Source: Home / Self Care | Attending: Emergency Medicine | Admitting: Emergency Medicine

## 2015-05-02 ENCOUNTER — Encounter (HOSPITAL_COMMUNITY): Payer: Self-pay | Admitting: Emergency Medicine

## 2015-05-02 DIAGNOSIS — N6012 Diffuse cystic mastopathy of left breast: Secondary | ICD-10-CM

## 2015-05-02 NOTE — ED Provider Notes (Signed)
CSN: [id number]     Arrival date & time 05/02/15  1930 History   First MD Initiated Contact with Patient 05/02/15 2011     Chief Complaint  Patient presents with  . Breast Mass   (Consider location/radiation/quality/duration/timing/severity/associated sxs/prior Treatment) HPI  She is a 30 year old woman here for evaluation of breast cyst. She states she first noticed it back in November. It is underneath the left breast. She states it will show up at the end of her period and be quite painful and tender. Once her period ends, the cyst resolves. She states today is the last day of her menstrual period and she has the cyst under her left breast. It is quite painful. She takes Tylenol and ibuprofen as needed. No family history of breast cancer that she is aware of.  Past Medical History  Diagnosis Date  . 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 Obesity   . Vaginal Pap smear, abnormal    Past Surgical History  Procedure Laterality Date  . Colposcopy    . Colostomy    . Axilla abscess     Family History  Problem Relation Age of Onset  . Diabetes Mother   . Hypertension Mother   . Diabetes Maternal Grandmother   . Anesthesia problems Neg Hx    Social History  Substance Use Topics  . Smoking status: Never Smoker   . Smokeless tobacco: Never Used  . Alcohol Use: No   OB History    Gravida Para Term Preterm AB TAB SAB Ectopic Multiple Living   0 2     Review of Systems As in history of present illness Allergies  Ferrous sulfate and Shellfish allergy  Home Medications   Prior to Admission medications   Medication Sig Start Date End Date Taking? Authorizing Provider  acetaminophen (TYLENOL) 325 MG tablet Take 650 mg by mouth every 6 (six) hours as needed for headache.    Historical Provider, MD  diphenhydrAMINE (BENADRYL) 25 MG tablet Take 1 tablet (25 mg total) by mouth every 6 (six) hours. 02/18/15   Fayrene Helper,  PA-C  EPINEPHrine (EPIPEN 2-PAK) 0.3 mg/0.3 mL IJ SOAJ injection Inject 0.3 mLs (0.3 mg total) into the muscle once. 12/01/14   Jerelyn Scott, MD  famotidine (PEPCID) 20 MG tablet Take 1 tablet (20 mg total) by mouth 2 (two) times daily. 02/18/15   Fayrene Helper, PA-C  ibuprofen (ADVIL,MOTRIN) 600 MG tablet Take 1 tablet (600 mg total) by mouth every 6 (six) hours. Patient not taking: Reported on 12/01/2014 09/02/14   Smitty Cords, DO  norethindrone-ethinyl estradiol (BREVICON, 28,) 0.5-35 MG-MCG tablet Take 1 tablet by mouth daily. 10/17/14   Reva Bores, MD  predniSONE (DELTASONE) 20 MG tablet 2 tabs po daily x 4 days 02/18/15   Fayrene Helper, PA-C   Meds Ordered and Administered this Visit  Medications - No data to display  BP 133/78 mmHg  Pulse 68  Temp(Src) 98.6 F (37 C) (Oral)  SpO2 100%  LMP 04/27/2015 (Exact Date) No data found.   Physical Exam  Constitutional: She is oriented to person, place, and time. She appears well-nourished. No distress.  Cardiovascular: Normal rate.   Pulmonary/Chest: Effort normal.  Genitourinary:  She has a 1 cm smooth, tender, mobile cyst underneath the left breast.  Neurological: She is alert and oriented to person, place, and time.    ED Course  Procedures (including critical care time)  Labs Review Labs Reviewed - No data to display  Imaging Review No results found.     MDM   1. Fibrocystic breast, left    Exam findings and history are consistent with hormonally mediated fibrocystic changes. Recommended symptomatic treatment with Tylenol, ibuprofen, ice or heat. She does have an appointment with a new PCP scheduled in February. I recommended discussing this with her PCP as well. No need for a mammogram at this time. If the cyst becomes present all the time or changes, she should come back for recheck.     Charm Rings, MD 05/02/15 2032

## 2015-05-02 NOTE — ED Notes (Signed)
The patient presented to the [facility] with a complaint of a lump under her left breast that has been present since the end of November 2016.

## 2015-05-02 NOTE — Discharge Instructions (Signed)
You have fibrocystic changes in your breast. These typically fluctuate with your menstrual cycle. You can take Tylenol or ibuprofen as needed for discomfort. You can also do warm or cold compresses. Please follow-up with your new doctor as scheduled in February. If things change or get worse, please come back so we can recheck you.

## 2015-05-29 ENCOUNTER — Ambulatory Visit: Payer: Medicaid Other | Admitting: Family Medicine

## 2015-06-20 IMAGING — US US OB FOLLOW-UP
1 series · 12 of 28 positions shown · non-contrast
Comparison: none

[Series 1: us ob follow up · 74 acquisitions, 12 frames shown]
[im 3/74]
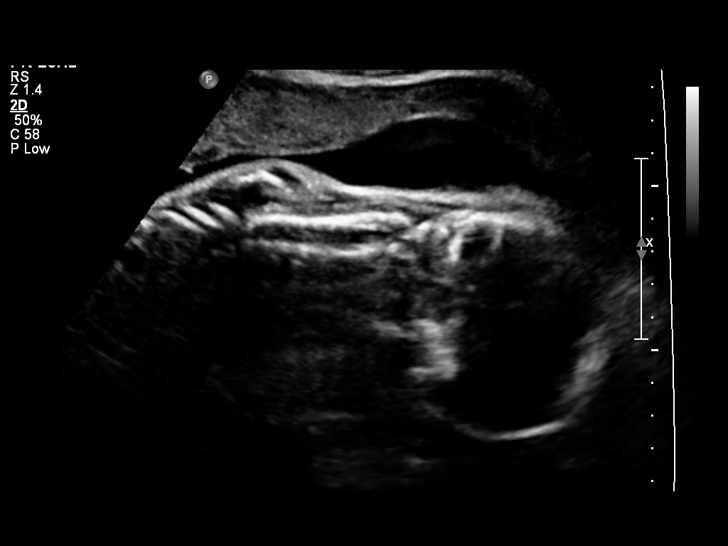
[im 9/74]
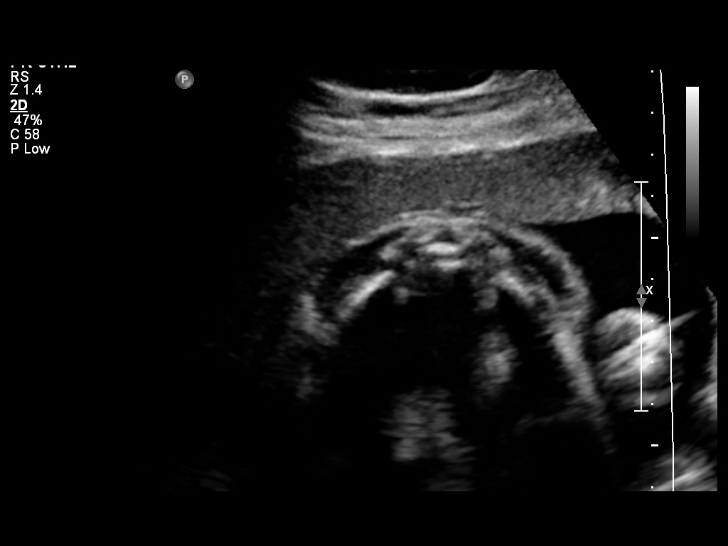
[im 14/74]
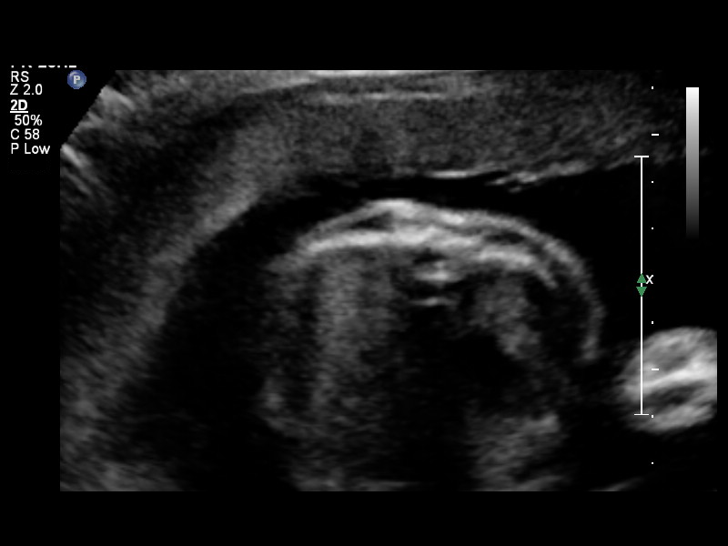
[im 22/74]
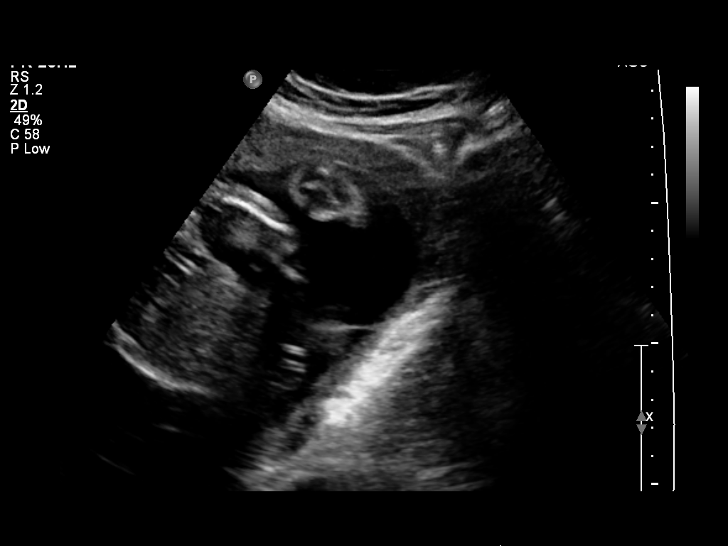
[im 28/74]
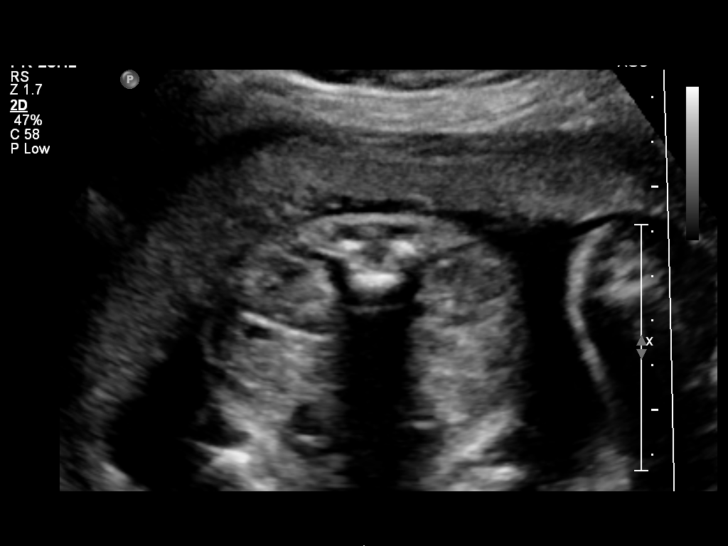
[im 33/74]
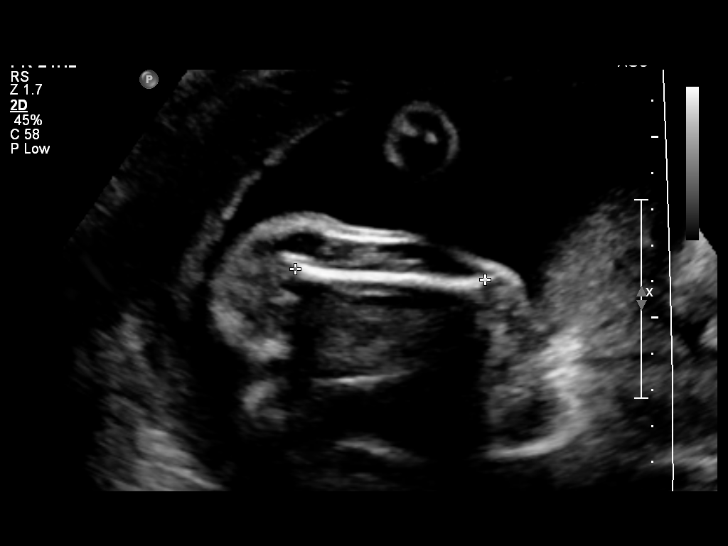
[im 41/74]
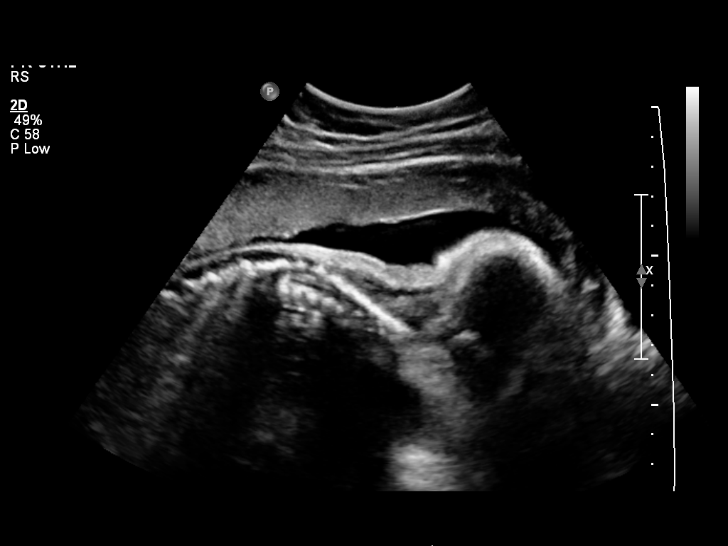
[im 46/74]
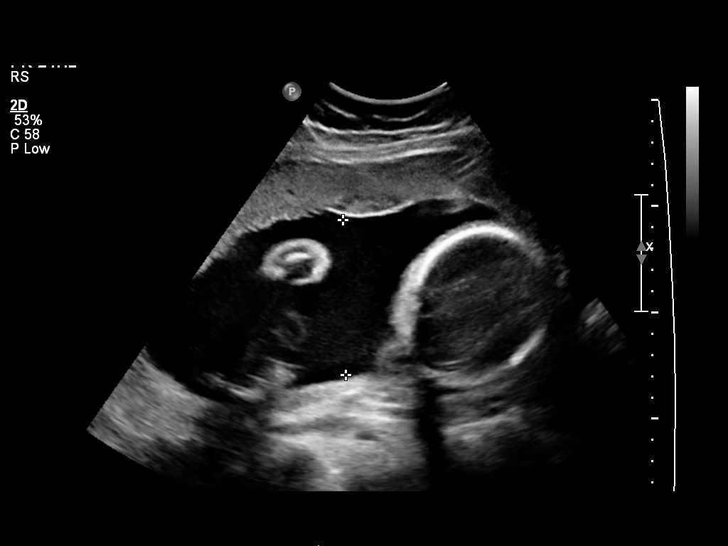
[im 52/74]
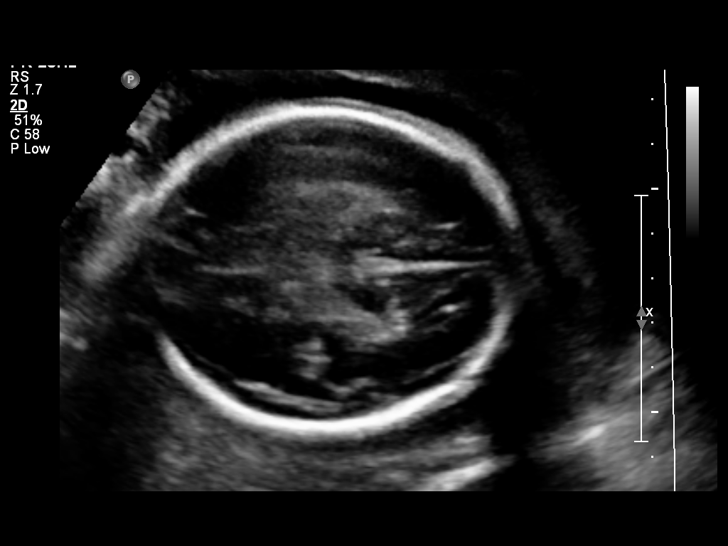
[im 60/74]
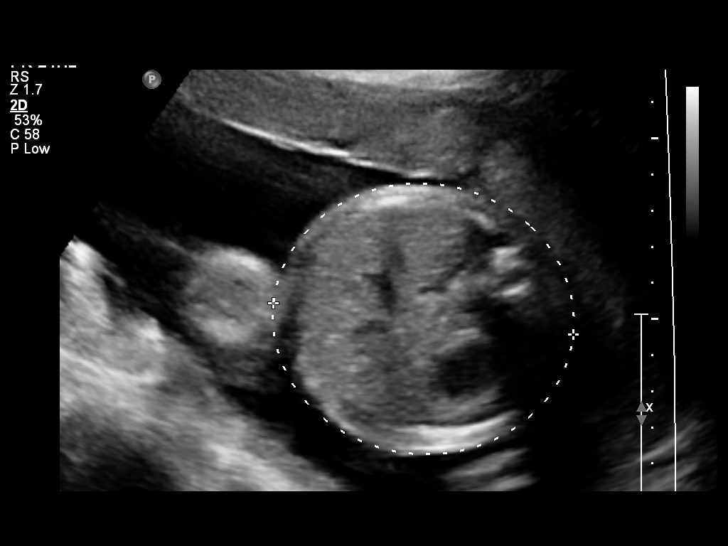
[im 65/74]
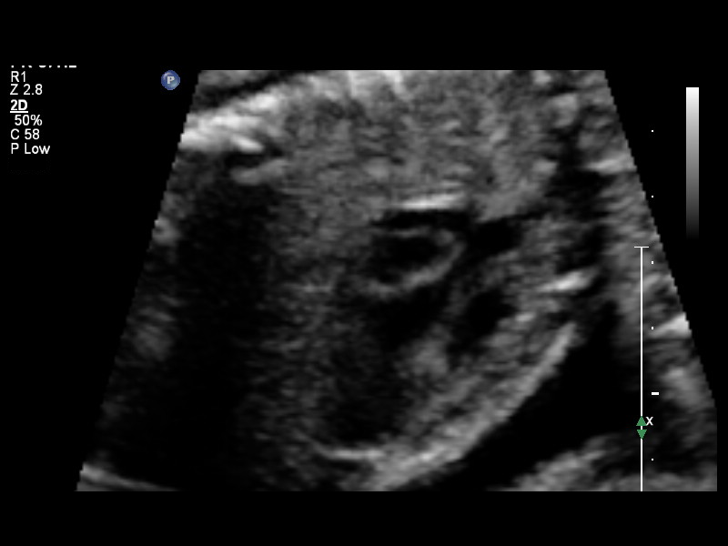
[im 71/74]
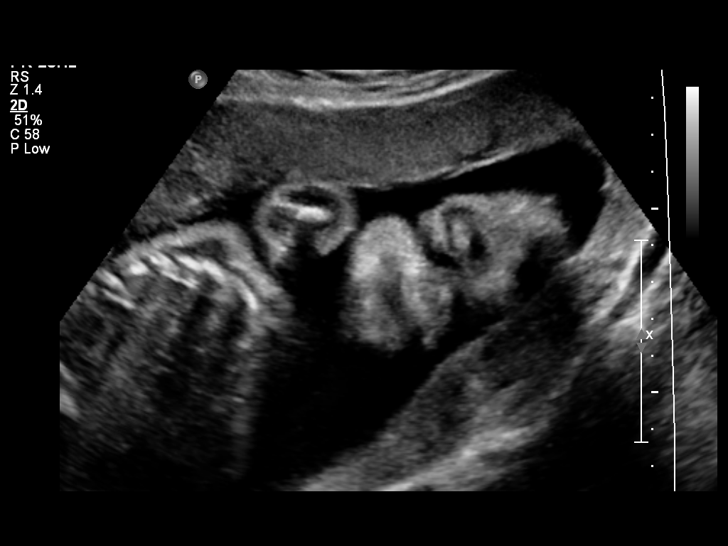

[12 of 28 positions shown; findings below may reference images not displayed]

OBSTETRICS REPORT
                      (Signed Final 05/30/2014 [DATE])

Service(s) Provided

 US OB FOLLOW UP                                       76816.1
Indications

 Maternal morbid obesity
 Follow-up incomplete fetal anatomic evaluation        Z36
 27 weeks gestation of pregnancy
Fetal Evaluation

 Num Of Fetuses:    1
 Fetal Heart Rate:  143                          bpm
 Cardiac Activity:  Observed
 Presentation:      Cephalic
 Placenta:          Anterior, above cervical os
 P. Cord            Previously Visualized
 Insertion:

 Amniotic Fluid
 AFI FV:      Subjectively within normal limits
                                             Larg Pckt:     7.3  cm
Biometry

 BPD:     70.1  mm     G. Age:  28w 1d                CI:        73.01   70 - 86
                                                      FL/HC:      20.7   18.8 -

 HC:     260.8  mm     G. Age:  28w 3d       41  %    HC/AC:      1.07   1.05 -

 AC:     243.3  mm     G. Age:  28w 4d       69  %    FL/BPD:     77.2   71 - 87
 FL:      54.1  mm     G. Age:  28w 4d       62  %    FL/AC:      22.2   20 - 24
 Est. FW:    8005  gm    2 lb 12 oz      69  %
Gestational Age

 LMP:           25w 3d        Date:  12/03/13                 EDD:   09/09/14
 U/S Today:     28w 3d                                        EDD:   08/19/14
 Best:          27w 5d     Det. By:  U/S (04/18/14)           EDD:   08/24/14
Anatomy

 Cranium:          Appears normal         Aortic Arch:      Appears normal
 Fetal Cavum:      Appears normal         Ductal Arch:      Appears normal
 Ventricles:       Appears normal         Diaphragm:        Appears normal
 Choroid Plexus:   Appears normal         Stomach:          Appears normal, left
                                                            sided
 Cerebellum:       Appears normal         Abdomen:          Appears normal
 Posterior Fossa:  Previously seen        Abdominal Wall:   Previously seen
 Nuchal Fold:      Not applicable (>20    Cord Vessels:     Previously seen
                   wks GA)
 Face:             Orbits and profile     Kidneys:          Appear normal
                   previously seen
 Lips:             Previously seen        Bladder:          Appears normal
 Heart:            Appears normal         Spine:            Appears normal
                   (4CH, axis, and
                   situs)
 RVOT:             Appears normal         Lower             Previously seen
                                          Extremities:
 LVOT:             Appears normal         Upper             Previously seen
                                          Extremities:

 Other:  Male gender. Heels previously visualized. Technically difficult due to
         maternal habitus and fetal position.
Cervix Uterus Adnexa

 Cervical Length:    3.2      cm

 Cervix:       Normal appearance by transabdominal scan.

 Adnexa:     No abnormality visualized.
Impression

 SIUP at 27+5 weeks
 Normal interval anatomy; anatomic survey complete
 Normal amniotic fluid volume
 Appropriate interval growth with EFW at the 69th %tile
Recommendations

 Follow-up ultrasound for growth in 6 weeks (BMI)

## 2015-06-25 ENCOUNTER — Encounter: Payer: Self-pay | Admitting: Family Medicine

## 2015-06-25 ENCOUNTER — Ambulatory Visit (INDEPENDENT_AMBULATORY_CARE_PROVIDER_SITE_OTHER): Payer: Self-pay | Admitting: Family Medicine

## 2015-06-25 VITALS — BP 118/75 | HR 82 | Temp 98.4°F | Resp 14 | Ht 66.0 in | Wt 202.0 lb

## 2015-06-25 DIAGNOSIS — Z6832 Body mass index (BMI) 32.0-32.9, adult: Secondary | ICD-10-CM

## 2015-06-25 DIAGNOSIS — E669 Obesity, unspecified: Secondary | ICD-10-CM | POA: Insufficient documentation

## 2015-06-25 DIAGNOSIS — N611 Abscess of the breast and nipple: Secondary | ICD-10-CM

## 2015-06-25 LAB — CBC WITH DIFFERENTIAL/PLATELET
BASOS ABS: 0.1 10*3/uL (ref 0.0–0.1)
Basophils Relative: 1 % (ref 0–1)
Eosinophils Absolute: 0.1 10*3/uL (ref 0.0–0.7)
Eosinophils Relative: 2 % (ref 0–5)
HEMATOCRIT: 36.1 % (ref 36.0–46.0)
HEMOGLOBIN: 11.3 g/dL — AB (ref 12.0–15.0)
LYMPHS ABS: 2.1 10*3/uL (ref 0.7–4.0)
LYMPHS PCT: 32 % (ref 12–46)
MCH: 24.8 pg — AB (ref 26.0–34.0)
MCHC: 31.3 g/dL (ref 30.0–36.0)
MCV: 79.2 fL (ref 78.0–100.0)
MONOS PCT: 8 % (ref 3–12)
MPV: 9.8 fL (ref 8.6–12.4)
Monocytes Absolute: 0.5 10*3/uL (ref 0.1–1.0)
NEUTROS ABS: 3.8 10*3/uL (ref 1.7–7.7)
NEUTROS PCT: 57 % (ref 43–77)
PLATELETS: 402 10*3/uL — AB (ref 150–400)
RBC: 4.56 MIL/uL (ref 3.87–5.11)
RDW: 14.5 % (ref 11.5–15.5)
WBC: 6.6 10*3/uL (ref 4.0–10.5)

## 2015-06-25 LAB — COMPLETE METABOLIC PANEL WITH GFR
ALT: 39 U/L — ABNORMAL HIGH (ref 6–29)
AST: 21 U/L (ref 10–30)
Albumin: 3.7 g/dL (ref 3.6–5.1)
Alkaline Phosphatase: 71 U/L (ref 33–115)
BILIRUBIN TOTAL: 0.4 mg/dL (ref 0.2–1.2)
BUN: 9 mg/dL (ref 7–25)
CHLORIDE: 106 mmol/L (ref 98–110)
CO2: 26 mmol/L (ref 20–31)
Calcium: 8.8 mg/dL (ref 8.6–10.2)
Creat: 0.74 mg/dL (ref 0.50–1.10)
GLUCOSE: 90 mg/dL (ref 65–99)
POTASSIUM: 3.6 mmol/L (ref 3.5–5.3)
SODIUM: 140 mmol/L (ref 135–146)
TOTAL PROTEIN: 6.7 g/dL (ref 6.1–8.1)

## 2015-06-25 LAB — TSH: TSH: 0.94 m[IU]/L

## 2015-06-25 LAB — HEMOGLOBIN A1C
Hgb A1c MFr Bld: 5.9 % — ABNORMAL HIGH (ref <5.7)
MEAN PLASMA GLUCOSE: 123 mg/dL — AB (ref <117)

## 2015-06-25 MED ORDER — SULFAMETHOXAZOLE-TRIMETHOPRIM 800-160 MG PO TABS: 1.0000 | ORAL_TABLET | Freq: Two times a day (BID) | ORAL | Status: DC

## 2015-06-25 NOTE — Progress Notes (Signed)
Subjective:    Patient ID: Barbara Waller, female    DOB: 02-13-1986, 30 y.o.   MRN: [id number]  HPI Ms. Barbara Waller, a 30 year old female presents to establish care. She states that she has not had a primary provider  In many years. She maintains that she has not seen a provider since her son was born greater than 1 year ago. She is currently complaining of an abscess to left breast. She states that she frequently gets abscesses to skin folds and breast. She states that abscess has been present for 2 weeks. She maintains that it is not draining.  Symptoms have been intermittent. Abscess has associated symptoms of tenderness and increased warmth to area. She denies fever, fatigue, dysuria, polydipsia, polyuria, or polyphagia. She reports a family history of diabetes, but denies a personal history.  Past Medical History  Diagnosis Date  . 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 Obesity   . Vaginal Pap smear, abnormal    Immunization History  Administered Date(s) Administered  . Influenza Split 03/24/2011  . Tdap 06/20/2014   Social History   Social History  . Marital Status: Married    Spouse Name: N/A  . Number of Children: N/A  . Years of Education: N/A   Occupational History  . Not on file.   Social History Main Topics  . Smoking status: Never Smoker   . Smokeless tobacco: Never Used  . Alcohol Use: No  . Drug Use: No  . Sexual Activity:    Partners: Male   Other Topics Concern  . Not on file   Social History Narrative   Past Surgical History  Procedure Laterality Date  . Colposcopy    . Colostomy    . Axilla abscess     Review of Systems  Constitutional: Negative for fever and fatigue.  HENT: Negative.   Eyes: Negative.  Negative for photophobia and visual disturbance.  Respiratory: Negative.   Cardiovascular: Negative for chest pain, palpitations and leg swelling.  Gastrointestinal: Negative.   Negative for constipation and blood in stool.  Endocrine: Negative for polydipsia, polyphagia and polyuria.  Genitourinary: Negative.   Musculoskeletal: Negative.   Skin: Negative for wound.       Warm, tender, abscess to left breast  Allergic/Immunologic: Positive for environmental allergies.  Neurological: Negative.   Hematological: Negative.   Psychiatric/Behavioral: Negative.  Negative for suicidal ideas and sleep disturbance.       Objective:   Physical Exam  Constitutional: She is oriented to person, place, and time.  HENT:  Head: Normocephalic and atraumatic.  Right Ear: External ear normal.  Left Ear: External ear normal.  Nose: Nose normal.  Mouth/Throat: Oropharynx is clear and moist.  Eyes: Conjunctivae and EOM are normal. Pupils are equal, round, and reactive to light.  Neck: Normal range of motion. Neck supple.  Abdominal: Soft. Bowel sounds are normal.  Neurological: She is alert and oriented to person, place, and time. She has normal reflexes.  Skin: Skin is warm and dry.  1 cmX 1 , round, raised, warm/tender to touch, hyperpigmented abscess underneath left breast.   Psychiatric: She has a normal mood and affect. Her behavior is normal. Judgment and thought content normal.      BP 118/75 mmHg  Pulse 82  Temp(Src) 98.4 F (36.9 C) (Oral)  Resp 14  Ht 5\' 6"  (1.676 m)  Wt 202 lb (91.627 kg)  BMI 32.62 kg/m2  LMP 06/19/2015 Assessment & Plan:  1. Left breast abscess Recommend applying warm, moist compresses to abscess.  - sulfamethoxazole-trimethoprim (BACTRIM DS) 800-160 MG tablet; Take 1 tablet by mouth 2 (two) times daily.  Dispense: 20 tablet; Refill: 0 - CBC with Differential  2. Obesity - Hemoglobin A1c - TSH - COMPLETE METABOLIC PANEL WITH GFR - POCT urinalysis dipstick  3. BMI 32.0-32.9,adult Recommend a lowfat, low carbohydrate diet divided over 5-6 small meals, increase water intake to 6-8 glasses, and 150 minutes per week of cardiovascular  exercise.     RTC:  Patient is up to date on immunizations Recommend a pap smear in 1 year    Kanda Deluna M, FNP The patient was given clear instructions to go to ER or return to medical center if symptoms do not improve, worsen or new problems develop. The patient verbalized understanding. Will notify patient with laboratory results.

## 2015-06-26 DIAGNOSIS — Z6832 Body mass index (BMI) 32.0-32.9, adult: Secondary | ICD-10-CM | POA: Insufficient documentation

## 2015-06-26 DIAGNOSIS — N611 Abscess of the breast and nipple: Secondary | ICD-10-CM | POA: Insufficient documentation

## 2015-06-26 HISTORY — DX: Abscess of the breast and nipple: N61.1

## 2015-06-26 HISTORY — DX: Body mass index (BMI) 32.0-32.9, adult: Z68.32

## 2015-06-26 MED FILL — SULFAMETHOXAZOLE/TMP DS TAB: 800-160 | 10 days supply | Qty: 20 | Fill #0

## 2015-06-27 ENCOUNTER — Telehealth: Payer: Self-pay

## 2015-06-27 NOTE — Telephone Encounter (Signed)
Called and left message for patient to call back regarding lab results. Will try later. Thanks!

## 2015-06-27 NOTE — Telephone Encounter (Signed)
-----   Message from Massie MaroonLachina M Hollis, [place] sent at 06/26/2015  6:40 AM EDT ----- Regarding: lab results Please inform patient that hemoglobin a1C is 5.9 %, which is consistent with prediabetes. Goal is to be less than 5.7%. Recommend a lowfat, low carbohydrate diet divided over 5-6 small meals, increase water intake to 6-8 glasses, and 150 minutes per week of cardiovascular exercise.  Schedule an appointment to follow up in 6 months for prediabetes  Thanks  ----- Message -----    From: Lab in [facility]    Sent: 06/25/2015  10:55 PM      To: Massie MaroonLachina M Hollis, FNP

## 2015-06-28 NOTE — Telephone Encounter (Signed)
Patient called and I advised of labs and of elevated hgba1c. Advised patient to eat low fat/ low carb diet over 5 to 6 meals daily, drink 6 to 8 glasses of water daily, and exercise 150 minutes weekly. Advised patient to follow up in 6 months and appointment was scheduled for 01/03/2016 @3 :30pm. Thanks!

## 2015-06-28 NOTE — Telephone Encounter (Signed)
Called and left message for patient to call back regarding labs. Thanks!  

## 2015-07-22 ENCOUNTER — Emergency Department (HOSPITAL_COMMUNITY)
Admission: EM | Admit: 2015-07-22 | Discharge: 2015-07-23 | Disposition: A | Discharge status: Home / Self Care | Payer: Self-pay | Attending: Emergency Medicine | Admitting: Emergency Medicine

## 2015-07-22 ENCOUNTER — Encounter (HOSPITAL_COMMUNITY): Payer: Self-pay

## 2015-07-22 DIAGNOSIS — E669 Obesity, unspecified: Secondary | ICD-10-CM | POA: Insufficient documentation

## 2015-07-22 DIAGNOSIS — Z792 Long term (current) use of antibiotics: Secondary | ICD-10-CM | POA: Insufficient documentation

## 2015-07-22 DIAGNOSIS — J020 Streptococcal pharyngitis: Secondary | ICD-10-CM | POA: Insufficient documentation

## 2015-07-22 DIAGNOSIS — Z79899 Other long term (current) drug therapy: Secondary | ICD-10-CM | POA: Insufficient documentation

## 2015-07-22 LAB — RAPID STREP SCREEN (MED CTR MEBANE ONLY): Streptococcus, Group A Screen (Direct): POSITIVE — AB

## 2015-07-22 MED ORDER — ONDANSETRON HCL 4 MG/2ML IJ SOLN
4.0000 mg | Freq: Once | INTRAMUSCULAR | Status: AC
  Administered 2015-07-22: 4 mg via INTRAVENOUS
  Filled 2015-07-22: qty 2

## 2015-07-22 MED ORDER — PENICILLIN G BENZATHINE 1200000 UNIT/2ML IM SUSP
1.2000 10*6.[IU] | Freq: Once | INTRAMUSCULAR | Status: AC
  Administered 2015-07-22: 1.2 10*6.[IU] via INTRAMUSCULAR
  Filled 2015-07-22: qty 2

## 2015-07-22 MED ORDER — KETOROLAC TROMETHAMINE 30 MG/ML IJ SOLN
30.0000 mg | Freq: Once | INTRAMUSCULAR | Status: AC
  Administered 2015-07-22: 30 mg via INTRAVENOUS
  Filled 2015-07-22: qty 1

## 2015-07-22 MED ORDER — SODIUM CHLORIDE 0.9 % IV BOLUS (SEPSIS)
1000.0000 mL | Freq: Once | INTRAVENOUS | Status: AC
  Administered 2015-07-22: 1000 mL via INTRAVENOUS

## 2015-07-22 MED ORDER — ACETAMINOPHEN 160 MG/5ML PO SOLN
650.0000 mg | Freq: Once | ORAL | Status: AC
  Administered 2015-07-22: 650 mg via ORAL
  Filled 2015-07-22: qty 20.3

## 2015-07-22 MED ORDER — DEXAMETHASONE SODIUM PHOSPHATE 10 MG/ML IJ SOLN
10.0000 mg | Freq: Once | INTRAMUSCULAR | Status: AC
  Administered 2015-07-22: 10 mg via INTRAVENOUS
  Filled 2015-07-22: qty 1

## 2015-07-22 NOTE — ED Provider Notes (Addendum)
By signing my name below, I, Lorenza Chickharli Hall, attest that this documentation has been prepared under the direction and in the presence of Raelyn NumberKristen N Ward, DO Electronically Signed: Lorenza Chickharli Hall, ED Scribe. 07/23/2015. 12:01 AM.  TIME SEEN: 11:18 PM   CHIEF COMPLAINT:  Chief Complaint  Patient presents with  . Oral Swelling     HPI:   HPI Comments: Barbara Waller is a 30 y.o. female with no significant past medical history who presents to the Emergency Department complaining of a sore throat started today. She notes recent sick contact with husband who was diagnosed with strep last week. She attempted to take a pain pill and could not swallow it, prompting her to come to the ED tonight. Pt notes an associated mild dry cough. Pt denies nausea, vomiting, diarrhea, swelling of her lips and tongue. No difficulty breathing. She is able to swallow her own saliva. She has not had a flu shot this year. Her LNMP was one week ago.  ROS: See HPI Constitutional:  fever  Eyes: no drainage  ENT: no runny nose   Cardiovascular:  no chest pain  Resp: no SOB  GI: no vomiting GU: no dysuria Integumentary: no rash  Allergy: no hives  Musculoskeletal: no leg swelling  Neurological: no slurred speech ROS otherwise negative  PAST MEDICAL HISTORY/PAST SURGICAL HISTORY:  Past Medical History  Diagnosis Date  . 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 Obesity   . Vaginal Pap smear, abnormal     MEDICATIONS:  Prior to Admission medications   Medication Sig Start Date End Date Taking? Authorizing Provider  acetaminophen (TYLENOL) 325 MG tablet Take 650 mg by mouth every 6 (six) hours as needed for headache.    Historical Provider, MD  diphenhydrAMINE (BENADRYL) 25 MG tablet Take 1 tablet (25 mg total) by mouth every 6 (six) hours. 02/18/15   Fayrene HelperBowie Tran, PA-C  EPINEPHrine (EPIPEN 2-PAK) 0.3 mg/0.3 mL IJ SOAJ injection Inject 0.3 mLs (0.3 mg total) into the  muscle once. 12/01/14   Jerelyn ScottMartha Linker, MD  famotidine (PEPCID) 20 MG tablet Take 1 tablet (20 mg total) by mouth 2 (two) times daily. 02/18/15   Fayrene HelperBowie Tran, PA-C  norethindrone-ethinyl estradiol (BREVICON, 28,) 0.5-35 MG-MCG tablet Take 1 tablet by mouth daily. Patient not taking: Reported on 06/25/2015 10/17/14   Reva Boresanya S Pratt, MD  sulfamethoxazole-trimethoprim (BACTRIM DS) 800-160 MG tablet Take 1 tablet by mouth 2 (two) times daily. 06/25/15   Massie MaroonLachina M Hollis, FNP    ALLERGIES:  Allergies  Allergen Reactions  . Ferrous Sulfate Swelling  . Shellfish Allergy Swelling    SOCIAL HISTORY:  Social History  Substance Use Topics  . Smoking status: Never Smoker   . Smokeless tobacco: Never Used  . Alcohol Use: No    FAMILY HISTORY: Family History  Problem Relation Age of Onset  . Diabetes Mother   . Hypertension Mother   . Diabetes Maternal Grandmother   . Anesthesia problems Neg Hx     EXAM: BP 138/97 mmHg  Pulse 134  Temp(Src) 101 F (38.3 C) (Oral)  Resp 18  Ht 5\' 6"  (1.676 m)  Wt 201 lb 6 oz (91.343 kg)  BMI 32.52 kg/m2  SpO2 97%  LMP 07/16/2015 CONSTITUTIONAL:  Febrile but non toxic apperaing. Alert and oriented and responds appropriately to questions. Well-appearing; well-nourished HEAD: Normocephalic EYES: Conjunctivae clear, PERRL ENT: normal nose; no rhinorrhea; moist mucous membranes; patient has posterior pharyngeal erythema without  petechiae, bilateral tonsillar hypertrophy with exudate, no uvular deviation, no trismus or drooling, normal phonation, no stridor, no dental caries or abscess noted, no Ludwig's angina, tongue sits flat in the bottom of the mouth; no angioedema NECK: Supple, no meningismus, anterior cervical LAD.  CARD: regular rhythm and tachycardic S1 and S2 appreciated; no murmurs, no clicks, no rubs, no gallops RESP: Normal chest excursion without splinting or tachypnea; breath sounds clear and equal bilaterally; no wheezes, no rhonchi, no rales, no  hypoxia or respiratory distress, speaking full sentences ABD/GI: Normal bowel sounds; non-distended; soft, non-tender, no rebound, no guarding, no peritoneal signs BACK:  The back appears normal and is non-tender to palpation, there is no CVA tenderness EXT: Normal ROM in all joints; non-tender to palpation; no edema; normal capillary refill; no cyanosis, no calf tenderness or swelling    SKIN: Normal color for age and race; warm; no rash NEURO: Moves all extremities equally, sensation to light touch intact diffusely, cranial nerves II through XII intact PSYCH: The patient's mood and manner are appropriate. Grooming and personal hygiene are appropriate.  MEDICAL DECISION MAKING: Patient here with fever, enlarged tonsils with exudate.  No angioedema on exam. Swallowing her saliva. She is febrile to feel is causing her tachycardia. She is nontoxic appearing. We'll give her liquid Tylenol, IV Toradol, IV Decadron, IV fluids. We'll send strep test. Suspect that she does have strep pharyngitis based on exam and recent exposure to her husband. Have offered her oral amoxicillin versus IM penicillin. She agrees to IM penicillin.  No sign of deep space neck infection, peritonsillar abscess, meningitis, pneumonia on exam.  ED PROGRESS: Patient reports feeling better. Her heart rate is now in the low 100s. She is able to swallow. I feel she is safe to be discharged home. Discussed supportive care instructions including alternating Tylenol and Motrin. Recommended rest and increased fluid intake. She does not need to be discharged with antibiotics.    At this time, I do not feel there is any life-threatening condition present. I have reviewed and discussed all results (EKG, imaging, lab, urine as appropriate), exam findings with patient. I have reviewed nursing notes and appropriate previous records.  I feel the patient is safe to be discharged home without further emergent workup. Discussed usual and customary  return precautions. Patient and family (if present) verbalize understanding and are comfortable with this plan.  Patient will follow-up with their primary care provider. If they do not have a primary care provider, information for follow-up has been provided to them. All questions have been answered.    I personally performed the services described in this documentation, which was scribed in my presence. The recorded information has been reviewed and is accurate.   Layla Maw Ward, DO 07/23/15 0030  Layla Maw Ward, DO 07/23/15 0030

## 2015-07-22 NOTE — ED Notes (Signed)
Pt reports this morning sore throat.  Pt tried to swallow pill PTA and unable to.  Throat swollen.  Pt able to swallow saliva.

## 2015-07-23 NOTE — ED Notes (Signed)
Patient given water,

## 2015-07-23 NOTE — Discharge Instructions (Signed)
You may alternate between Tylenol 1000 mg every 6 hours as needed for fever and pain and ibuprofen 800 mg every 8 hours as needed for fever and pain. You may use warm salt water to gargle with which may help with your pain. If you develop difficulty speaking, breathing or cannot swallow your own saliva, please return to the hospital.   Strep Throat Strep throat is a bacterial infection of the throat. Your health care provider may call the infection tonsillitis or pharyngitis, depending on whether there is swelling in the tonsils or at the back of the throat. Strep throat is most common during the cold months of the year in children who are 575-32 years of age, but it can happen during any season in people of any age. This infection is spread from person to person (contagious) through coughing, sneezing, or close contact. CAUSES Strep throat is caused by the bacteria called Streptococcus pyogenes. RISK FACTORS This condition is more likely to develop in:  People who spend time in crowded places where the infection can spread easily.  People who have close contact with someone who has strep throat. SYMPTOMS Symptoms of this condition include:  Fever or chills.   Redness, swelling, or pain in the tonsils or throat.  Pain or difficulty when swallowing.  White or yellow spots on the tonsils or throat.  Swollen, tender glands in the neck or under the jaw.  Red rash all over the body (rare). DIAGNOSIS This condition is diagnosed by performing a rapid strep test or by taking a swab of your throat (throat culture test). Results from a rapid strep test are usually ready in a few minutes, but throat culture test results are available after one or two days. TREATMENT This condition is treated with antibiotic medicine. HOME CARE INSTRUCTIONS Medicines  Take over-the-counter and prescription medicines only as told by your health care provider.  Take your antibiotic as told by your health care  provider. Do not stop taking the antibiotic even if you start to feel better.  Have family members who also have a sore throat or fever tested for strep throat. They may need antibiotics if they have the strep infection. Eating and Drinking  Do not share food, drinking cups, or personal items that could cause the infection to spread to other people.  If swallowing is difficult, try eating soft foods until your sore throat feels better.  Drink enough fluid to keep your urine clear or pale yellow. General Instructions  Gargle with a salt-water mixture 3-4 times per day or as needed. To make a salt-water mixture, completely dissolve -1 tsp of salt in 1 cup of warm water.  Make sure that all household members wash their hands well.  Get plenty of rest.  Stay home from school or work until you have been taking antibiotics for 24 hours.  Keep all follow-up visits as told by your health care provider. This is important. SEEK MEDICAL CARE IF:  The glands in your neck continue to get bigger.  You develop a rash, cough, or earache.  You cough up a thick liquid that is green, yellow-brown, or bloody.  You have pain or discomfort that does not get better with medicine.  Your problems seem to be getting worse rather than better.  You have a fever. SEEK IMMEDIATE MEDICAL CARE IF:  You have new symptoms, such as vomiting, severe headache, stiff or painful neck, chest pain, or shortness of breath.  You have severe throat pain, drooling,  or changes in your voice.  You have swelling of the neck, or the skin on the neck becomes red and tender.  You have signs of dehydration, such as fatigue, dry mouth, and decreased urination.  You become increasingly sleepy, or you cannot wake up completely.  Your joints become red or painful.   This information is not intended to replace advice given to you by your health care provider. Make sure you discuss any questions you have with your health  care provider.   Document Released: 03/28/2000 Document Revised: 12/20/2014 Document Reviewed: 07/24/2014 Elsevier Interactive Patient Education [employer]

## 2015-07-23 NOTE — ED Notes (Signed)
Patient states she is able to swallow the water.

## 2016-01-03 ENCOUNTER — Encounter: Payer: Self-pay | Admitting: Family Medicine

## 2016-01-03 ENCOUNTER — Ambulatory Visit (INDEPENDENT_AMBULATORY_CARE_PROVIDER_SITE_OTHER): Payer: Self-pay | Admitting: Family Medicine

## 2016-01-03 VITALS — BP 117/75 | HR 90 | Temp 98.5°F | Resp 16 | Ht 66.0 in | Wt 205.0 lb

## 2016-01-03 DIAGNOSIS — T7840XD Allergy, unspecified, subsequent encounter: Secondary | ICD-10-CM

## 2016-01-03 DIAGNOSIS — L0293 Carbuncle, unspecified: Secondary | ICD-10-CM

## 2016-01-03 DIAGNOSIS — R7303 Prediabetes: Secondary | ICD-10-CM

## 2016-01-03 DIAGNOSIS — N611 Abscess of the breast and nipple: Secondary | ICD-10-CM

## 2016-01-03 LAB — POCT GLYCOSYLATED HEMOGLOBIN (HGB A1C): Hemoglobin A1C: 5.5

## 2016-01-03 MED ORDER — EPINEPHRINE 0.3 MG/0.3ML IJ SOAJ: 0.3000 mg | Freq: Once | INTRAMUSCULAR | 1 refills | Status: AC

## 2016-01-03 MED ORDER — SULFAMETHOXAZOLE-TRIMETHOPRIM 800-160 MG PO TABS: 1.0000 | ORAL_TABLET | Freq: Two times a day (BID) | ORAL | 0 refills | Status: DC

## 2016-01-03 MED FILL — SULFAMETHOXAZOLE-TMP DS TAB: 800-160 | 10 days supply | Qty: 20 | Fill #0

## 2016-01-04 NOTE — Patient Instructions (Signed)
Continue working on low carb, low fat diet, with increased vegetables. Try to increase exercise.

## 2016-01-04 NOTE — Progress Notes (Signed)
Barbara Waller, is a 30 y.o. female  YQM:[id number]  BMW:[id number]  DOB - 1985/08/17  CC:  Chief Complaint  Patient presents with  . Follow-up    PRE DIABETES   . Recurrent Skin Infections    IN GROIN AREA   . Contraception       HPI: Barbara Waller is a 30 y.o. female here to follow-up up on prediabetes. She was diagnosed in March with an A1C of 5.9.She reports working on following a low carb, low fat diet but does not exercise regularly. She reports feeling well. Her on complaint is of recurrent small boils under her breast and in her groin. She has been treated with Bactrim in the past. The last time about 6 months. Ago. She also needs a refill on an epi pen for allergic reactions. A1C today is 5.7 Allergies  Allergen Reactions  . Ferrous Sulfate Swelling  . Shellfish Allergy Swelling   Past Medical History:  Diagnosis Date  . 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 Obesity   . Vaginal Pap smear, abnormal    Current Outpatient Prescriptions on File Prior to Visit  Medication Sig Dispense Refill  . acetaminophen (TYLENOL) 325 MG tablet Take 650 mg by mouth every 6 (six) hours as needed for headache.    . loratadine (CLARITIN) 10 MG tablet Take 10 mg by mouth daily as needed for allergies.    Marland Kitchen norethindrone-ethinyl estradiol (BREVICON, 28,) 0.5-35 MG-MCG tablet Take 1 tablet by mouth daily. (Patient not taking: Reported on 01/03/2016) 1 Package 11   No current facility-administered medications on file prior to visit.    Family History  Problem Relation Age of Onset  . Diabetes Mother   . Hypertension Mother   . Diabetes Maternal Grandmother   . Anesthesia problems Neg Hx    Social History   Social History  . Marital status: Married    Spouse name: N/A  . Number of children: N/A  . Years of education: N/A   Occupational History  . Not on file.   Social History Main Topics  . Smoking status: Never Smoker   . Smokeless tobacco: Never Used  . Alcohol use No  . Drug use: No  . Sexual activity: Yes    Partners: Male   Other Topics Concern  . Not on file   Social History Narrative  . No narrative on file    Review of Systems: Constitutional: Negative  Skin: Positive for small boils HENT: Negative  Eyes: Negative  Neck: Negative  Respiratory: Negative  Cardiovascular: Negative  Gastrointestinal: Negative  Genitourinary: Negative  Musculoskeletal: Negative Neurological: Negative  numbness  Hematological: Negative  Psychiatric/Behavioral: Negative     Objective:   Vitals:   01/03/16 1532  BP: 117/75  Pulse: 90  Resp: 16  Temp: 98.5 F (36.9 C)    Physical Exam: Constitutional: Patient appears well-developed and well-nourished. No distress. HENT: Normocephalic, atraumatic, External right and left ear normal. Oropharynx is clear and moist.  Eyes: Conjunctivae and EOM are normal. PERRLA, no scleral icterus. Neck: Normal ROM. Neck supple. No lymphadenopathy, No thyromegaly. CVS: RRR, S1/S2 +, no murmurs, no gallops, no rubs Pulmonary: Effort and breath sounds normal, no stridor, rhonchi, wheezes, rales.  Abdominal: Soft. Normoactive BS,, no distension, tenderness, rebound or guarding.  Musculoskeletal: Normal range of motion. No edema and no tenderness.  Neuro: Alert.Normal muscle tone coordination. Non-focal Skin: Skin is warm and dry. No rash noted. Not  diaphoretic. No erythema. No pallor.There are a few small papule/pustules in the groin area Psychiatric: Normal mood and affect. Behavior, judgment, thought content normal.  Lab Results  Component Value Date   WBC 6.6 06/25/2015   HGB 11.3 (L) 06/25/2015   HCT 36.1 06/25/2015   MCV 79.2 06/25/2015   PLT 402 (H) 06/25/2015   Lab Results  Component Value Date   CREATININE 0.74 06/25/2015   BUN 9 06/25/2015   NA 140 06/25/2015   K 3.6 06/25/2015   CL 106 06/25/2015   CO2 26 06/25/2015    Lab Results   Component Value Date   HGBA1C 5.5 01/03/2016   Lipid Panel  No results found for: CHOL, TRIG, HDL, CHOLHDL, VLDL, LDLCALC     Assessment and plan:   1. Prediabetes  - POCT glycosylated hemoglobin (Hb A1C)   2. Boils - sulfamethoxazole-trimethoprim (BACTRIM DS) 800-160 MG tablet; Take 1 tablet by mouth 2 (two) times daily.  Dispense: 20 tablet; Refill: 0  3. Allergy, subsequent encounter  - EPINEPHrine (EPIPEN 2-PAK) 0.3 mg/0.3 mL IJ SOAJ injection; Inject 0.3 mLs (0.3 mg total) into the muscle once.  Dispense: 1 Device; Refill: 1   No Follow-up on file.  The patient was given clear instructions to go to ER or return to medical center if symptoms don't improve, worsen or new problems develop. The patient verbalized understanding.      Henrietta HooverLinda C. Ave Scharnhorst, MSN, FNP-BC   01/04/2016, 7:58 AM

## 2016-03-13 ENCOUNTER — Ambulatory Visit (INDEPENDENT_AMBULATORY_CARE_PROVIDER_SITE_OTHER): Payer: Self-pay | Admitting: Family Medicine

## 2016-03-13 ENCOUNTER — Encounter: Payer: Self-pay | Admitting: Family Medicine

## 2016-03-13 VITALS — BP 111/71 | HR 72 | Temp 98.1°F | Resp 16 | Ht 66.0 in | Wt 203.0 lb

## 2016-03-13 DIAGNOSIS — R05 Cough: Secondary | ICD-10-CM

## 2016-03-13 DIAGNOSIS — R059 Cough, unspecified: Secondary | ICD-10-CM

## 2016-03-13 MED ORDER — GUAIFENESIN-CODEINE 100-10 MG/5ML PO SOLN: 5.0000 mL | Freq: Three times a day (TID) | ORAL | 0 refills | Status: DC | PRN

## 2016-03-13 NOTE — Progress Notes (Signed)
Claudia DesanctisVanessa Rogacki, is a 30 y.o. female  OZH:[id number]  GEX:[id number]  DOB - [birth year]  CC:  Chief Complaint  Patient presents with  . Cyst    on left arm   . Cough    coughing up green mucus x 1 week. pain in right side of chest all the time since coughing started.        HPI: Claudia DesanctisVanessa Colao is a 30 y.o. female here for sick visit. She complains of a cough that has been going on for about a week. It is improving. Her main concern is some soreness in the right upper chest. She denies ear ache, nasal congestion, ST, fever, chills. The cough is disturbing her sleep.    Allergies  Allergen Reactions  . Ferrous Sulfate Swelling  . Shellfish Allergy Swelling   Past Medical History:  Diagnosis Date  . 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 Obesity   . Vaginal Pap smear, abnormal    Current Outpatient Prescriptions on File Prior to Visit  Medication Sig Dispense Refill  . acetaminophen (TYLENOL) 325 MG tablet Take 650 mg by mouth every 6 (six) hours as needed for headache.    . ibuprofen (ADVIL,MOTRIN) 200 MG tablet Take 200 mg by mouth every 6 (six) hours as needed.    . loratadine (CLARITIN) 10 MG tablet Take 10 mg by mouth daily as needed for allergies.     No current facility-administered medications on file prior to visit.    Family History  Problem Relation Age of Onset  . Diabetes Mother   . Hypertension Mother   . Diabetes Maternal Grandmother   . Anesthesia problems Neg Hx    Social History   Social History  . Marital status: Married    Spouse name: N/A  . Number of children: N/A  . Years of education: N/A   Occupational History  . Not on file.   Social History Main Topics  . Smoking status: Never Smoker  . Smokeless tobacco: Never Used  . Alcohol use No  . Drug use: No  . Sexual activity: Yes    Partners: Male   Other Topics Concern  . Not on file   Social History Narrative  . No narrative  on file    Review of Systems: See HPI  Objective:   Vitals:   03/13/16 1052  BP: 111/71  Pulse: 72  Resp: 16  Temp: 98.1 F (36.7 C)    Physical Exam: Constitutional: Patient appears well-developed and well-nourished. No distress. HENT: Normocephalic, atraumatic, External right and left ear normal. Oropharynx is clear and moist.  Eyes: Conjunctivae and EOM are normal. PERRLA, no scleral icterus. Neck: Normal ROM. Neck supple. No lymphadenopathy, No thyromegaly. CVS: RRR, S1/S2 +, no murmurs, no gallops, no rubs Pulmonary: Effort and breath sounds normal, no stridor, rhonchi, wheezes, rales.  Neuro: Alert.Normal muscle tone coordination. Non-focal Skin: Skin is warm and dry. No rash noted. Not diaphoretic. No erythema. No pallor. Psychiatric: Normal mood and affect. Behavior, judgment, thought content normal.  Lab Results  Component Value Date   WBC 6.6 06/25/2015   HGB 11.3 (L) 06/25/2015   HCT 36.1 06/25/2015   MCV 79.2 06/25/2015   PLT 402 (H) 06/25/2015   Lab Results  Component Value Date   CREATININE 0.74 06/25/2015   BUN 9 06/25/2015   NA 140 06/25/2015   K 3.6 06/25/2015   CL 106 06/25/2015   CO2 26 06/25/2015  Lab Results  Component Value Date   HGBA1C 5.5 01/03/2016   Lipid Panel  No results found for: CHOL, TRIG, HDL, CHOLHDL, VLDL, LDLCALC      Assessment and plan:   1. Cough  - guaiFENesin-codeine 100-10 MG/5ML syrup; Take 5 mLs by mouth 3 (three) times daily as needed for cough.  Dispense: 120 mL; Refill: 0 -If not continuing to improve or gets worse after this point, will call in antibiotic.   Return if symptoms worsen or fail to improve.  The patient was given clear instructions to go to ER or return to medical center if symptoms don't improve, worsen or new problems develop. The patient verbalized understanding.    Henrietta HooverLinda C Ajene Carchi FNP  03/13/2016, 11:50 AM

## 2016-03-13 NOTE — Patient Instructions (Signed)
Lots of liquids Cough syrup as directed.

## 2016-04-04 ENCOUNTER — Other Ambulatory Visit: Payer: Self-pay | Admitting: *Deleted

## 2016-04-04 MED ORDER — EPINEPHRINE 0.3 MG/0.3ML IJ SOAJ: 0.3000 mg | Freq: Once | INTRAMUSCULAR | 1 refills | Status: AC

## 2016-04-04 NOTE — Telephone Encounter (Signed)
PRINTED FOR PASS PROGRAM 

## 2016-05-13 ENCOUNTER — Encounter: Payer: Self-pay | Admitting: Family Medicine

## 2016-05-13 ENCOUNTER — Other Ambulatory Visit: Payer: Self-pay | Admitting: Family Medicine

## 2016-05-13 DIAGNOSIS — Z91013 Allergy to seafood: Secondary | ICD-10-CM

## 2016-05-13 MED ORDER — EPINEPHRINE 0.3 MG/0.3ML IJ SOAJ: 0.3000 mg | Freq: Once | INTRAMUSCULAR | 0 refills | Status: AC

## 2016-05-16 ENCOUNTER — Ambulatory Visit (INDEPENDENT_AMBULATORY_CARE_PROVIDER_SITE_OTHER): Payer: Self-pay | Admitting: Family Medicine

## 2016-05-16 ENCOUNTER — Encounter: Payer: Self-pay | Admitting: Family Medicine

## 2016-05-16 ENCOUNTER — Ambulatory Visit (HOSPITAL_COMMUNITY)
Admission: RE | Admit: 2016-05-16 | Discharge: 2016-05-16 | Disposition: A | Discharge status: Home / Self Care | Payer: Self-pay | Source: Ambulatory Visit | Attending: Family Medicine | Admitting: Family Medicine

## 2016-05-16 VITALS — BP 113/75 | HR 79 | Temp 98.4°F | Resp 14 | Ht 66.0 in | Wt 199.0 lb

## 2016-05-16 DIAGNOSIS — M4186 Other forms of scoliosis, lumbar region: Secondary | ICD-10-CM | POA: Insufficient documentation

## 2016-05-16 DIAGNOSIS — M545 Low back pain, unspecified: Secondary | ICD-10-CM | POA: Insufficient documentation

## 2016-05-16 DIAGNOSIS — G8929 Other chronic pain: Secondary | ICD-10-CM

## 2016-05-16 HISTORY — DX: Other chronic pain: G89.29

## 2016-05-16 HISTORY — DX: Low back pain, unspecified: M54.50

## 2016-05-16 LAB — POCT URINE PREGNANCY: Preg Test, Ur: NEGATIVE

## 2016-05-16 MED ORDER — CYCLOBENZAPRINE HCL 5 MG PO TABS: 5.0000 mg | ORAL_TABLET | Freq: Every day | ORAL | 0 refills | Status: DC

## 2016-05-16 MED FILL — CYCLOBENZAPRINE 5 MG TABLET: 5 | 30 days supply | Qty: 30 | Fill #0

## 2016-05-16 NOTE — Patient Instructions (Signed)
Chronic Back Pain When back pain lasts longer than 3 months, it is called chronic back pain.The cause of your back pain may not be known. Some common causes include:  Wear and tear (degenerative disease) of the bones, ligaments, or disks in your back.  Inflammation and stiffness in your back (arthritis). People who have chronic back pain often go through certain periods in which the pain is more intense (flare-ups). Many people can learn to manage the pain with home care. Follow these instructions at home: Pay attention to any changes in your symptoms. Take these actions to help with your pain: Activity  Avoid bending and activities that make the problem worse.  Do not sit or stand in one place for long periods of time.  Take brief periods of rest throughout the day. This will reduce your pain. Resting in a lying or standing position is usually better than sitting to rest.  When you are resting for longer periods, mix in some mild activity or stretching between periods of rest. This will help to prevent stiffness and pain.  Get regular exercise. Ask your health care provider what activities are safe for you.  Do not lift anything that is heavier than 10 lb (4.5 kg). Always use proper lifting technique, which includes:  Bending your knees.  Keeping the load close to your body.  Avoiding twisting. Managing pain  If directed, apply ice to the painful area. Your health care provider may recommend applying ice during the first 24-48 hours after a flare-up begins.  Put ice in a plastic bag.  Place a towel between your skin and the bag.  Leave the ice on for 20 minutes, 2-3 times per day.  After icing, apply heat to the affected area as often as told by your health care provider. Use the heat source that your health care provider recommends, such as a moist heat pack or a heating pad.  Place a towel between your skin and the heat source.  Leave the heat on for 20-30  minutes.  Remove the heat if your skin turns bright red. This is especially important if you are unable to feel pain, heat, or cold. You may have a greater risk of getting burned.  Try soaking in a warm tub.  Take over-the-counter and prescription medicines only as told by your health care provider.  Keep all follow-up visits as told by your health care provider. This is important. Contact a health care provider if:  You have pain that is not relieved with rest or medicine. Get help right away if:  You have weakness or numbness in one or both of your legs or feet.  You have trouble controlling your bladder or your bowels.  You have nausea or vomiting.  You have pain in your abdomen.  You have shortness of breath or you faint. This information is not intended to replace advice given to you by your health care provider. Make sure you discuss any questions you have with your health care provider. Document Released: 05/08/2004 Document Revised: 08/09/2015 Document Reviewed: 09/18/2014 Elsevier Interactive Patient Education  2017 [employer]  Back Exercises Introduction If you have pain in your back, do these exercises 2-3 times each day or as told by your doctor. When the pain goes away, do the exercises once each day, but repeat the steps more times for each exercise (do more repetitions). If you do not have pain in your back, do these exercises once each day or as told by  your doctor. Exercises Single Knee to Chest  Do these steps 3-5 times in a row for each leg: 1. Lie on your back on a firm bed or the floor with your legs stretched out. 2. Bring one knee to your chest. 3. Hold your knee to your chest by grabbing your knee or thigh. 4. Pull on your knee until you feel a gentle stretch in your lower back. 5. Keep doing the stretch for 10-30 seconds. 6. Slowly let go of your leg and straighten it. Pelvic Tilt  Do these steps 5-10 times in a row: 1. Lie on your back on a firm  bed or the floor with your legs stretched out. 2. Bend your knees so they point up to the ceiling. Your feet should be flat on the floor. 3. Tighten your lower belly (abdomen) muscles to press your lower back against the floor. This will make your tailbone point up to the ceiling instead of pointing down to your feet or the floor. 4. Stay in this position for 5-10 seconds while you gently tighten your muscles and breathe evenly. Cat-Cow  Do these steps until your lower back bends more easily: 1. Get on your hands and knees on a firm surface. Keep your hands under your shoulders, and keep your knees under your hips. You may put padding under your knees. 2. Let your head hang down, and make your tailbone point down to the floor so your lower back is round like the back of a cat. 3. Stay in this position for 5 seconds. 4. Slowly lift your head and make your tailbone point up to the ceiling so your back hangs low (sags) like the back of a cow. 5. Stay in this position for 5 seconds. Press-Ups  Do these steps 5-10 times in a row: 1. Lie on your belly (face-down) on the floor. 2. Place your hands near your head, about shoulder-width apart. 3. While you keep your back relaxed and keep your hips on the floor, slowly straighten your arms to raise the top half of your body and lift your shoulders. Do not use your back muscles. To make yourself more comfortable, you may change where you place your hands. 4. Stay in this position for 5 seconds. 5. Slowly return to lying flat on the floor. Bridges  Do these steps 10 times in a row: 1. Lie on your back on a firm surface. 2. Bend your knees so they point up to the ceiling. Your feet should be flat on the floor. 3. Tighten your butt muscles and lift your butt off of the floor until your waist is almost as high as your knees. If you do not feel the muscles working in your butt and the back of your thighs, slide your feet 1-2 inches farther away from your  butt. 4. Stay in this position for 3-5 seconds. 5. Slowly lower your butt to the floor, and let your butt muscles relax. If this exercise is too easy, try doing it with your arms crossed over your chest. Belly Crunches  Do these steps 5-10 times in a row: 1. Lie on your back on a firm bed or the floor with your legs stretched out. 2. Bend your knees so they point up to the ceiling. Your feet should be flat on the floor. 3. Cross your arms over your chest. 4. Tip your chin a little bit toward your chest but do not bend your neck. 5. Tighten your belly muscles and slowly  raise your chest just enough to lift your shoulder blades a tiny bit off of the floor. 6. Slowly lower your chest and your head to the floor. Back Lifts  Do these steps 5-10 times in a row: 1. Lie on your belly (face-down) with your arms at your sides, and rest your forehead on the floor. 2. Tighten the muscles in your legs and your butt. 3. Slowly lift your chest off of the floor while you keep your hips on the floor. Keep the back of your head in line with the curve in your back. Look at the floor while you do this. 4. Stay in this position for 3-5 seconds. 5. Slowly lower your chest and your face to the floor. Contact a doctor if:  Your back pain gets a lot worse when you do an exercise.  Your back pain does not lessen 2 hours after you exercise. If you have any of these problems, stop doing the exercises. Do not do them again unless your doctor says it is okay. Get help right away if:  You have sudden, very bad back pain. If this happens, stop doing the exercises. Do not do them again unless your doctor says it is okay. This information is not intended to replace advice given to you by your health care provider. Make sure you discuss any questions you have with your health care provider. Document Released: 05/03/2010 Document Revised: 09/06/2015 Document Reviewed: 05/25/2014  2017 Elsevier

## 2016-05-16 NOTE — Progress Notes (Signed)
Subjective:    Patient ID: Barbara Waller, female    DOB: [birth year], 31 y.o.   MRN: [id number]  Back Pain  This is a chronic problem. The current episode started more than 1 month ago. The problem occurs intermittently. The problem has been waxing and waning since onset. The pain is present in the lumbar spine. The pain is at a severity of 3/10. The pain is mild. The pain is worse during the night. The symptoms are aggravated by lying down, bending and twisting. Pertinent negatives include no abdominal pain, bladder incontinence, bowel incontinence, chest pain, dysuria, fever, headaches, leg pain, numbness, paresis, paresthesias, pelvic pain, perianal numbness, tingling, weakness or weight loss. Risk factors include poor posture, lack of exercise and obesity. She has tried analgesics for the symptoms. The treatment provided mild relief.    BP 113/75 (BP Location: Right Arm, Patient Position: Sitting, Cuff Size: Normal)   Pulse 79   Temp 98.4 F (36.9 C) (Oral)   Resp 14   Ht 5\' 6"  (1.676 m)   Wt 199 lb (90.3 kg)   LMP 04/28/2016   SpO2 100%   BMI 32.12 kg/m    Immunization History  Administered Date(s) Administered  . Influenza Split 03/24/2011  . Tdap 06/20/2014   Social History   Social History  . Marital status: Married    Spouse name: N/A  . Number of children: N/A  . Years of education: N/A   Occupational History  . Not on file.   Social History Main Topics  . Smoking status: Never Smoker  . Smokeless tobacco: Never Used  . Alcohol use No  . Drug use: No  . Sexual activity: Yes    Partners: Male   Other Topics Concern  . Not on file   Social History Narrative  . No narrative on file   Review of Systems  Constitutional: Negative.  Negative for fever and weight loss.  HENT: Negative.   Eyes: Negative.   Respiratory: Negative.   Cardiovascular: Negative.  Negative for chest pain.  Gastrointestinal: Negative.  Negative for abdominal pain and bowel  incontinence.  Endocrine: Negative.   Genitourinary: Negative.  Negative for bladder incontinence, dysuria and pelvic pain.  Musculoskeletal: Positive for back pain.  Allergic/Immunologic: Negative.   Neurological: Negative.  Negative for tingling, weakness, numbness, headaches and paresthesias.  Psychiatric/Behavioral: Negative.       Objective:   Physical Exam  Constitutional: She is oriented to person, place, and time.  HENT:  Head: Normocephalic and atraumatic.  Right Ear: External ear normal.  Left Ear: External ear normal.  Nose: Nose normal.  Mouth/Throat: Oropharynx is clear and moist.  Eyes: Conjunctivae and EOM are normal. Pupils are equal, round, and reactive to light.  Neck: Normal range of motion. Neck supple.  Cardiovascular: Normal rate, regular rhythm, normal heart sounds and intact distal pulses.   Pulmonary/Chest: Effort normal and breath sounds normal.  Abdominal: Soft. Bowel sounds are normal.  Musculoskeletal:       Lumbar back: She exhibits pain and spasm. She exhibits normal range of motion.  Neurological: She is alert and oriented to person, place, and time. She has normal reflexes.  Psychiatric: She has a normal mood and affect. Her behavior is normal. Thought content normal.      BP 113/75 (BP Location: Right Arm, Patient Position: Sitting, Cuff Size: Normal)   Pulse 79   Temp 98.4 F (36.9 C) (Oral)   Resp 14   Ht 5\' 6"  (1.676 m)  Wt 199 lb (90.3 kg)   LMP 04/28/2016   SpO2 100%   BMI 32.12 kg/m  Assessment & Plan:  1. Chronic midline low back pain without sciatica Given written information on back exercises and stretches. Patient expressed understanding.  - DG Lumbar Spine Complete; Future - POCT urine pregnancy - cyclobenzaprine (FLEXERIL) 5 MG tablet; Take 1 tablet (5 mg total) by mouth at bedtime.  Dispense: 30 tablet; Refill: 0  Recommend a lowfat, low carbohydrate diet divided over 5-6 small meals, increase water intake to 6-8 glasses,  and 150 minutes per week of cardiovascular exercise.   RTC: Will schedule follow up following image review   Barbara Waller M, FNP  The patient was given clear instructions to go to ER or return to medical center if symptoms do not improve, worsen or new problems develop. The patient verbalized understanding. Will notify patient with laboratory results.

## 2016-05-27 ENCOUNTER — Encounter: Payer: Self-pay | Admitting: Family Medicine

## 2016-05-27 ENCOUNTER — Telehealth: Payer: Self-pay

## 2016-05-27 NOTE — Telephone Encounter (Signed)
Reviewed letter

## 2016-06-16 MED FILL — $Epipen 2-pak: 30 days supply | Qty: 2 | Fill #0

## 2016-07-02 ENCOUNTER — Ambulatory Visit: Payer: Self-pay | Admitting: Family Medicine

## 2016-07-08 ENCOUNTER — Ambulatory Visit (INDEPENDENT_AMBULATORY_CARE_PROVIDER_SITE_OTHER): Payer: Self-pay | Admitting: Family Medicine

## 2016-07-08 ENCOUNTER — Encounter: Payer: Self-pay | Admitting: Family Medicine

## 2016-07-08 VITALS — HR 72 | Temp 98.6°F | Ht 66.0 in | Wt 200.0 lb

## 2016-07-08 DIAGNOSIS — R7303 Prediabetes: Secondary | ICD-10-CM

## 2016-07-08 DIAGNOSIS — N611 Abscess of the breast and nipple: Secondary | ICD-10-CM

## 2016-07-08 DIAGNOSIS — L732 Hidradenitis suppurativa: Secondary | ICD-10-CM

## 2016-07-08 DIAGNOSIS — E8881 Metabolic syndrome: Secondary | ICD-10-CM

## 2016-07-08 LAB — POCT URINE PREGNANCY: Preg Test, Ur: NEGATIVE

## 2016-07-08 LAB — TSH: TSH: 0.52 mIU/L

## 2016-07-08 MED ORDER — SULFAMETHOXAZOLE-TRIMETHOPRIM 800-160 MG PO TABS: 1.0000 | ORAL_TABLET | Freq: Two times a day (BID) | ORAL | 0 refills | Status: DC

## 2016-07-08 MED FILL — SULFAMETHOXAZOLE/TMP DS TAB: 800-160 | 10 days supply | Qty: 20 | Fill #0

## 2016-07-08 NOTE — Patient Instructions (Addendum)
Follow a daily skin-care routine. Gently wash your body with a nonsoap cleanser such as Cetaphil. Use only your hands, not washcloths, loofahs or other items that might irritate the skin. If odor is a concern, try an antibacterial body wash. Then apply an over-the-counter antibiotic cream. It might also help to apply extra absorbent powder or zinc oxide. Using antiperspirants may help keep the skin dry. Stop using any product that irritates your skin. Manage your pain. Gently applying a wet, warm washcloth, teabag or other sort of compress can help reduce swelling and ease pain. Keep it on for about 10 minutes. Ask your doctor to recommend the most appropriate pain reliever. And talk with your doctor about how to properly dress and care for your wounds at home.  Avoid tight clothes and irritating products. Wear loose, lightweight clothes to reduce friction. Some women find that using tampons rather than sanitary pads causes less friction with the skin. Use detergents and other products that are free of perfumes, dyes and enzymes. Avoid injuring the skin. For example, don't squeeze the pimples and sores. And stop shaving affected skin.  Keep a healthy weight and stay active. Not being at a healthy weight can make symptoms worse. Try to find activities that don't irritate your skin.  Consider altering your diet. In an informal study, 4847 people with hidradenitis suppurativa gave up dairy products and processed sugar and flour. Of those, 83 percent experienced reduced symptoms. Also, a study reported on 12 people being treated for hidradenitis suppurativa who avoided beer and other foods containing brewer's yeast or wheat. They all saw their symptoms clear up within a year. Avoid all tobacco products. If you smoke, try to quit. Smoking and other tobacco use may play a role in making hidradenitis suppurativa worse.       Diet for Metabolic Syndrome Metabolic syndrome is a disorder that includes at least three  of these conditions:  Abdominal obesity.  Too much sugar in your blood.  High blood pressure.  Higher than normal amount of fat (lipids) in your blood.  Lower than normal level of "good" cholesterol (HDL). Following a healthy diet can help to keep metabolic syndrome under control. It can also help to prevent the development of conditions that are associated with metabolic syndrome, such as diabetes, heart disease, and stroke. Along with exercise, a healthy diet:  Helps to improve the way that the body uses insulin.  Promotes weight loss. A common goal for people with this condition is to lose at least 7 to 10 percent of their starting weight. What do I need to know about this diet?  Use the glycemic index (GI) to plan your meals. The index tells you how quickly a food will raise your blood sugar. Choose foods that have low GI values. These foods take a longer time to raise blood sugar.  Keep track of how many calories you take in. Eating the right amount of calories will help your achieve a healthy weight.  You may want to follow a Mediterranean diet. This diet includes lots of vegetables, lean meats or fish, whole grains, fruits, and healthy oils and fats. What foods can I eat? Grains  Stone-ground whole wheat. Pumpernickel bread. Whole-grain bread, crackers, tortillas, cereal, and pasta. Unsweetened oatmeal.Bulgur.Barley.Quinoa.Brown rice or wild rice. Vegetables  Lettuce. Spinach. Peas. Beets. Cauliflower. Cabbage. Broccoli. Carrots. Tomatoes. Squash. Eggplant. Herbs. Peppers. Onions. Cucumbers. Brussels sprouts. Sweet potatoes. Yams. Beans. Lentils. Fruits  Berries. Apples. Oranges. Grapes. Mango. Pomegranate. Kiwi. Cherries. Meats and  Other Protein Sources  Seafood and shellfish. Lean meats.Poultry. Tofu. Dairy  Low-fat or fat-free dairy products, such as milk, yogurt, and cheese. Beverages  Water. Low-fat milk. Milk alternatives, like soy milk or almond milk. Real fruit  juice. Condiments  Low-sugar or sugar-free ketchup, barbecue sauce, and mayonnaise. Mustard. Relish. Fats and Oils  Avocado. Canola or olive oil. Nuts and nut butters.Seeds. The items listed above may not be a complete list of recommended foods or beverages. Contact your dietitian for more options.  What foods are not recommended? Red meat. Palm oil and coconut oil. Processed foods. Fried foods. Alcohol. Sweetened drinks, such as iced tea and soda. Sweets. Salty foods. The items listed above may not be a complete list of foods and beverages to avoid. Contact your dietitian for more information.  This information is not intended to replace advice given to you by your health care provider. Make sure you discuss any questions you have with your health care provider. Document Released: 08/15/2014 Document Revised: 08/10/2015 Document Reviewed: 04/12/2014 Elsevier Interactive Patient Education  2017 [employer].

## 2016-07-08 NOTE — Progress Notes (Signed)
Subjective:    Patient ID: Barbara Waller, female    DOB: 03-16-1986, 31 y.o.   MRN: [id number]  HPI  Barbara Waller, a 31 year old female with a history of hidradenitis suppurativa presents complaining of a draining wound to right groin and left axilla. She says that wounds have been present for 10 days. She says that she has been getting recurring wounds since she was 31 years old. She has not utilized any OTC interventions to alleviate current symptoms . Symptoms have gradually worsened.  [name] denies fever, fatigue or odor. Pain intensity is 3/10 described as intermittent and sore.   Past Medical History:  Diagnosis Date  . 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 Obesity   . Vaginal Pap smear, abnormal    Social History   Social History  . Marital status: Married    Spouse name: N/A  . Number of children: N/A  . Years of education: N/A   Occupational History  . Not on file.   Social History Main Topics  . Smoking status: Never Smoker  . Smokeless tobacco: Never Used  . Alcohol use No  . Drug use: No  . Sexual activity: Yes    Partners: Male   Other Topics Concern  . Not on file   Social History Narrative  . No narrative on file   Review of Systems  Constitutional: Negative for fatigue.  HENT: Negative.   Eyes: Negative.   Respiratory: Negative.   Cardiovascular: Negative.  Negative for chest pain and leg swelling.  Gastrointestinal: Negative.   Endocrine: Negative.   Genitourinary: Negative.   Musculoskeletal: Negative.   Skin: Positive for wound (left breast abscess, left groin abscess).  Allergic/Immunologic: Negative.   Neurological: Negative.   Psychiatric/Behavioral: Negative.        Objective:   Physical Exam  Constitutional: She is oriented to person, place, and time.  HENT:  Head: Normocephalic and atraumatic.  Right Ear: External ear normal.  Left Ear: External ear normal.    Nose: Nose normal.  Mouth/Throat: Oropharynx is clear and moist.  Eyes: Conjunctivae and EOM are normal. Pupils are equal, round, and reactive to light.  Neck: Normal range of motion. Neck supple.  Cardiovascular: Normal rate, regular rhythm, normal heart sounds and intact distal pulses.   Pulmonary/Chest: Effort normal and breath sounds normal.  Abdominal: Soft. Bowel sounds are normal.  Musculoskeletal: Normal range of motion.  Neurological: She is alert and oriented to person, place, and time. She has normal reflexes.  Skin: Skin is warm and dry. There is erythema.     Psychiatric: She has a normal mood and affect. Her behavior is normal. Thought content normal.      Pulse 72   Temp 98.6 F (37 C) (Oral)   Ht 5\' 6"  (1.676 m)   Wt 200 lb (90.7 kg)   LMP 06/24/2016   SpO2 100%   BMI 32.28 kg/m  Assessment & Plan:  1. Left breast abscess Follow a daily skin-care routine. Gently wash your body with a nonsoap cleanser such as Cetaphil. Use only your hands, not washcloths, loofahs or other items that might irritate the skin. If odor is a concern, try an antibacterial body wash. Then apply an over-the-counter antibiotic cream. It might also help to apply extra absorbent powder or zinc oxide. Using antiperspirants may help keep the skin dry. Stop using any product that irritates your skin.  Manage your pain.  Gently applying a wet, warm washcloth, teabag or other sort of compress can help reduce swelling and ease pain. Keep it on for about 10 minutes. Ask your doctor to recommend the most appropriate pain reliever. And talk with your doctor about how to properly dress and care for your wounds at home.  Avoid tight clothes and irritating products. Wear loose, lightweight clothes to reduce friction. Some women find that using tampons rather than sanitary pads causes less friction with the skin. Use detergents and other products that are free of perfumes, dyes and enzymes.  Avoid injuring the  skin. For example, don't squeeze the pimples and sores. And stop shaving affected skin.  Keep a healthy weight and stay active. Not being at a healthy weight can make symptoms worse. Try to find activities that don't irritate your skin.  Consider altering your diet. In an informal study, 3447 people with hidradenitis suppurativa gave up dairy products and processed sugar and flour. Of those, 83 percent experienced reduced symptoms. Also, a study reported on 12 people being treated for hidradenitis suppurativa who avoided beer and other foods containing brewer's yeast or wheat. They all saw their symptoms clear up within a year.  Avoid all tobacco products. If you smoke, try to quit. Smoking and other tobacco use may play a role in making hidradenitis suppurativa worse.  - sulfamethoxazole-trimethoprim (BACTRIM DS,SEPTRA DS) 800-160 MG tablet; Take 1 tablet by mouth 2 (two) times daily.  Dispense: 20 tablet; Refill: 0 - POCT urine pregnancy  2. Suppurative hidradenitis - sulfamethoxazole-trimethoprim (BACTRIM DS,SEPTRA DS) 800-160 MG tablet; Take 1 tablet by mouth 2 (two) times daily.  Dispense: 20 tablet; Refill: 0  3. Prediabetes - Hemoglobin A1c  4. Metabolic syndrome Recommend a lowfat, low carbohydrate diet divided over 5-6 small meals, increase water intake to 6-8 glasses, and 150 minutes per week of cardiovascular exercise.   - TSH   [name] [name]  MSN, FNP-C [facility] [address]  [place], [place] [id number] [phone]

## 2016-07-09 LAB — HEMOGLOBIN A1C
HEMOGLOBIN A1C: 5.5 % (ref <5.7)
Mean Plasma Glucose: 111 mg/dL

## 2017-01-08 ENCOUNTER — Encounter: Payer: Self-pay | Admitting: Family Medicine

## 2017-01-08 ENCOUNTER — Ambulatory Visit (INDEPENDENT_AMBULATORY_CARE_PROVIDER_SITE_OTHER): Payer: Self-pay | Admitting: Family Medicine

## 2017-01-08 VITALS — BP 117/80 | HR 80 | Temp 98.7°F | Resp 14 | Ht 66.0 in | Wt 196.0 lb

## 2017-01-08 DIAGNOSIS — Z30011 Encounter for initial prescription of contraceptive pills: Secondary | ICD-10-CM

## 2017-01-08 DIAGNOSIS — G8929 Other chronic pain: Secondary | ICD-10-CM

## 2017-01-08 DIAGNOSIS — M545 Low back pain: Secondary | ICD-10-CM

## 2017-01-08 LAB — POCT URINE PREGNANCY: Preg Test, Ur: NEGATIVE

## 2017-01-08 MED ORDER — CYCLOBENZAPRINE HCL 5 MG PO TABS: 5.0000 mg | ORAL_TABLET | Freq: Every day | ORAL | 3 refills | Status: DC

## 2017-01-08 MED ORDER — NORGESTIMATE-ETH ESTRADIOL 0.25-35 MG-MCG PO TABS: 1.0000 | ORAL_TABLET | Freq: Every day | ORAL | 11 refills | Status: DC

## 2017-01-08 NOTE — Progress Notes (Signed)
Patient ID: Barbara Waller, female    DOB: [birth year], 31 y.o.   MRN: [id number]  PCP: Massie Maroon, FNP  Chief Complaint  Patient presents with  . Follow-up    6 MONTH    Subjective:  HPI Barbara Waller is a 31 y.o. female presents for family planning visit today and would like to initiate oral contraceptives today. Reports tolerating oral contraceptives in the past. Previously had IUD in place , reports issues with device and therefore had it removed. Denies a history of DVT, stroke, chest pain, chronic headaches or hypertension.Patient's last menstrual period was 12/30/2016. She request a refill of cyclobenzaprine for intermittent chronic low back pain for which she has been previously evaluated and treated.  Social History   Social History  . Marital status: Married    Spouse name: N/A  . Number of children: N/A  . Years of education: N/A   Occupational History  . Not on file.   Social History Main Topics  . Smoking status: Never Smoker  . Smokeless tobacco: Never Used  . Alcohol use No  . Drug use: No  . Sexual activity: Yes    Partners: Male   Other Topics Concern  . Not on file   Social History Narrative  . No narrative on file    Family History  Problem Relation Age of Onset  . Diabetes Mother   . Hypertension Mother   . Diabetes Maternal Grandmother   . Anesthesia problems Neg Hx    Review of Systems See HPI Patient Active Problem List   Diagnosis Date Noted  . Chronic midline low back pain without sciatica 05/16/2016  . 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 Prediabetes 01/03/2016  . Left breast abscess 06/26/2015  . BMI 32.0-32.9,adult 06/26/2015  . Obesity 06/25/2015  . Angioedema of lips 10/17/2014    Allergies  Allergen Reactions  . Ferrous Sulfate Swelling  . Shellfish Allergy Swelling    Prior to Admission medications   Medication Sig Start Date End Date Taking? Authorizing Provider  acetaminophen (TYLENOL) 325 MG tablet Take 650  mg by mouth every 6 (six) hours as needed for headache.   Yes [provider]  cyclobenzaprine (FLEXERIL) 5 MG tablet Take 1 tablet (5 mg total) by mouth at bedtime. 05/16/16  Yes Massie Maroon, FNP  ibuprofen (ADVIL,MOTRIN) 200 MG tablet Take 200 mg by mouth every 6 (six) hours as needed.   Yes [provider]  loratadine (CLARITIN) 10 MG tablet Take 10 mg by mouth daily as needed for allergies.   Yes [provider]    Past Medical, Surgical Family and Social History reviewed and updated.    Objective:   Today's Vitals   01/08/17 0903  BP: 117/80  Pulse: 80  Resp: 14  Temp: 98.7 F (37.1 C)  TempSrc: Oral  SpO2: 100%  Weight: 196 lb (88.9 kg)  Height:  (1.676 m)    Wt Readings from Last 3 Encounters:  01/08/17 196 lb (88.9 kg)  07/08/16 200 lb (90.7 kg)  05/16/16 199 lb (90.3 kg)    Physical Exam  Constitutional: She is oriented to person, place, and time. She appears well-developed and well-nourished.  HENT:  Head: Normocephalic and atraumatic.  Eyes: Pupils are equal, round, and reactive to light. Conjunctivae and EOM are normal.  Neck: Normal range of motion. Neck supple.  Cardiovascular: Normal rate, regular rhythm, normal heart sounds and intact distal pulses.   Pulmonary/Chest: Effort normal and breath sounds normal.  Abdominal: Soft. Bowel sounds are normal.  Neurological: She is alert and oriented to person, place, and time.  Skin: Skin is warm and dry.  Psychiatric: She has a normal mood and affect. Her behavior is normal. Judgment and thought content normal.   Assessment & Plan:  1. Encounter for initial prescription of contraceptive pills - POCT urine pregnancy -Will trial ortho-cyclen as oral contraceptive. Advised to continue barrier protection for at least 3 weeks to allow time for medication to be therapeutic and effective.  2. Chronic midline low back pain without sciatica - cyclobenzaprine (FLEXERIL) 5 MG tablet;  Take 1 tablet (5 mg total) by mouth at bedtime.   RTC: 12 months for annual CPE   Kong Packett S. Tiburcio Pea, MSN, FNP-C The Patient Care [facility]  [address] Maroon [place], [place] [address] [phone]

## 2017-01-08 NOTE — Patient Instructions (Signed)
Continue to use second method of birth control for at least 3 weeks. If you experience any unwarranted symptoms or side effects, contact us here at the office.  Nice meeting you!  Godfrey Pick. Tiburcio Pea, MSN, FNP-C The Patient Care [facility]  [address] [place], [place] [address] [phone]        Ethinyl Estradiol; Norgestimate tablets What is this medicine? ETHINYL ESTRADIOL; NORGESTIMATE (ETH in il es tra DYE ole; nor JES ti mate) is an oral contraceptive. The products combine two types of female hormones, an estrogen and a progestin. They are used to prevent ovulation and pregnancy. Some products are also used to treat acne in females. This medicine may be used for other purposes; ask your health care provider or pharmacist if you have questions. COMMON BRAND NAME(S): Estarylla, MONO-LINYAH, MonoNessa, Norgestimate/Ethinyl Estradiol, Ortho Tri-Cyclen, Ortho Tri-Cyclen Lo, Ortho-Cyclen, Previfem, Sprintec, Tri-Estarylla, TRI-LINYAH, Tri-Lo-Estarylla, Tri-Lo-Marzia, Tri-Lo-Sprintec, Tri-Previfem, Tri-Sprintec, Tri-VyLibra, Trinessa, Rachell Cipro What should I tell my health care provider before I take this medicine? They need to know if you have or ever had any of these conditions: -abnormal vaginal bleeding -blood vessel disease or blood clots -breast, cervical, endometrial, ovarian, liver, or uterine cancer -diabetes -gallbladder disease -heart disease or recent heart attack -high blood pressure -high cholesterol -kidney disease -liver disease -migraine headaches -stroke -systemic lupus erythematosus (SLE) -tobacco smoker -an unusual or allergic reaction to estrogens, progestins, other medicines, foods, dyes, or preservatives -pregnant or trying to get pregnant -breast-feeding How should I use this medicine? Take this medicine by mouth. To reduce nausea, this medicine may be taken with food. Follow the directions on the prescription label. Take  this medicine at the same time each day and in the order directed on the package. Do not take your medicine more often than directed. Contact your pediatrician regarding the use of this medicine in children. Special care may be needed. This medicine has been used in female children who have started having menstrual periods. A patient package insert for the product will be given with each prescription and refill. Read this sheet carefully each time. The sheet may change frequently. Overdosage: If you think you have taken too much of this medicine contact a poison control center or emergency room at once. NOTE: This medicine is only for you. Do not share this medicine with others. What if I miss a dose? If you miss a dose, refer to the patient information sheet you received with your medicine for direction. If you miss more than one pill, this medicine may not be as effective and you may need to use another form of birth control. What may interact with this medicine? Do not take this medicine with the following medication: -dasabuvir; ombitasvir; paritaprevir; ritonavir -ombitasvir; paritaprevir; ritonavir This medicine may also interact with the following medications: -acetaminophen -antibiotics or medicines for infections, especially rifampin, rifabutin, rifapentine, and griseofulvin, and possibly penicillins or tetracyclines -aprepitant -ascorbic acid (vitamin C) -atorvastatin -barbiturate medicines, such as phenobarbital -bosentan -carbamazepine -caffeine -clofibrate -cyclosporine -dantrolene -doxercalciferol -felbamate -grapefruit juice -hydrocortisone -medicines for anxiety or sleeping problems, such as diazepam or temazepam -medicines for diabetes, including pioglitazone -mineral oil -modafinil -mycophenolate -nefazodone -oxcarbazepine -phenytoin -prednisolone -ritonavir or other medicines for HIV infection or AIDS -rosuvastatin -selegiline -soy isoflavones  supplements -St. John's wort -tamoxifen or raloxifene -theophylline -thyroid hormones -topiramate -warfarin This list may not describe all possible interactions. Give your health care provider a list of all the medicines, herbs, non-prescription drugs, or dietary supplements you use.  Also tell them if you smoke, drink alcohol, or use illegal drugs. Some items may interact with your medicine. What should I watch for while using this medicine? Visit your doctor or health care professional for regular checks on your progress. You will need a regular breast and pelvic exam and Pap smear while on this medicine. You should also discuss the need for regular mammograms with your health care professional, and follow his or her guidelines for these tests. This medicine can make your body retain fluid, making your fingers, hands, or ankles swell. Your blood pressure can go up. Contact your doctor or health care professional if you feel you are retaining fluid. Use an additional method of contraception during the first cycle that you take these tablets. If you have any reason to think you are pregnant, stop taking this medicine right away and contact your doctor or health care professional. If you are taking this medicine for hormone related problems, it may take several cycles of use to see improvement in your condition. Do not use this product if you smoke and are over 67 years of age. Smoking increases the risk of getting a blood clot or having a stroke while you are taking birth control pills, especially if you are more than 33 years old. If you are a smoker who is 48 years of age or younger, you are strongly advised not to smoke while taking birth control pills. This medicine can make you more sensitive to the sun. Keep out of the sun. If you cannot avoid being in the sun, wear protective clothing and use sunscreen. Do not use sun lamps or tanning beds/booths. If you wear contact lenses and notice visual  changes, or if the lenses begin to feel uncomfortable, consult your eye care specialist. In some women, tenderness, swelling, or minor bleeding of the gums may occur. Notify your dentist if this happens. Brushing and flossing your teeth regularly may help limit this. See your dentist regularly and inform your dentist of the medicines you are taking. If you are going to have elective surgery, you may need to stop taking this medicine before the surgery. Consult your health care professional for advice. This medicine does not protect you against HIV infection (AIDS) or any other sexually transmitted diseases. What side effects may I notice from receiving this medicine? Side effects that you should report to your doctor or health care professional as soon as possible: -breast tissue changes or discharge -changes in vaginal bleeding during your period or between your periods -chest pain -coughing up blood -dizziness or fainting spells -headaches or migraines -leg, arm or groin pain -severe or sudden headaches -stomach pain (severe) -sudden shortness of breath -sudden loss of coordination, especially on one side of the body -speech problems -symptoms of vaginal infection like itching, irritation or unusual discharge -tenderness in the upper abdomen -vomiting -weakness or numbness in the arms or legs, especially on one side of the body -yellowing of the eyes or skin Side effects that usually do not require medical attention (report to your doctor or health care professional if they continue or are bothersome): -breakthrough bleeding and spotting that continues beyond the 3 initial cycles of pills -breast tenderness -mood changes, anxiety, depression, frustration, anger, or emotional outbursts -increased sensitivity to sun or ultraviolet light -nausea -skin rash, acne, or brown spots on the skin -weight gain (slight) This list may not describe all possible side effects. Call your doctor for  medical advice about side effects. You may  report side effects to FDA at 1-800-FDA-1088. Where should I keep my medicine? Keep out of the reach of children. Store at room temperature between 15 and 30 degrees C (59 and 86 degrees F). Throw away any unused medicine after the expiration date. NOTE: This sheet is a summary. It may not cover all possible information. If you have questions about this medicine, talk to your doctor, pharmacist, or health care provider.  2018 Elsevier/Gold Standard (2015-12-10 08:09:09)

## 2017-01-12 MED FILL — CYCLOBENZAPRINE 5 MG TABLET: 5 | 30 days supply | Qty: 30 | Fill #0

## 2017-01-12 MED FILL — MONO-LINYAH 28 TABLET: 0.25-35 | 28 days supply | Qty: 28 | Fill #0

## 2017-02-11 ENCOUNTER — Telehealth: Payer: Self-pay

## 2017-02-11 MED ORDER — NORGESTIMATE-ETH ESTRADIOL 0.25-35 MG-MCG PO TABS: 1.0000 | ORAL_TABLET | Freq: Every day | ORAL | 11 refills | Status: DC

## 2017-02-11 NOTE — Telephone Encounter (Signed)
Kim patient last saw you for this in 12/2016. Is this ok to refill? Please advise. Thanks!

## 2017-02-11 NOTE — Telephone Encounter (Signed)
Patient has several refill on birth control pills provided during her last office visit. I have e-prescribed the medication again to [facility].   Godfrey PickKimberly S. Tiburcio PeaHarris, MSN, FNP-C The Patient Care [facility]  [address] [place], [place] [address] [phone]

## 2017-02-13 MED FILL — MONO-LINYAH 28 TABLET: 0.25-35 | 28 days supply | Qty: 28 | Fill #0

## 2017-04-08 ENCOUNTER — Ambulatory Visit (INDEPENDENT_AMBULATORY_CARE_PROVIDER_SITE_OTHER): Payer: Self-pay | Admitting: Family Medicine

## 2017-04-08 ENCOUNTER — Encounter: Payer: Self-pay | Admitting: Family Medicine

## 2017-04-08 VITALS — BP 103/70 | HR 125 | Temp 99.8°F | Resp 18 | Ht 66.0 in | Wt 176.0 lb

## 2017-04-08 DIAGNOSIS — R059 Cough, unspecified: Secondary | ICD-10-CM

## 2017-04-08 DIAGNOSIS — R6889 Other general symptoms and signs: Secondary | ICD-10-CM

## 2017-04-08 DIAGNOSIS — R05 Cough: Secondary | ICD-10-CM

## 2017-04-08 DIAGNOSIS — J029 Acute pharyngitis, unspecified: Secondary | ICD-10-CM

## 2017-04-08 DIAGNOSIS — R509 Fever, unspecified: Secondary | ICD-10-CM

## 2017-04-08 LAB — POCT INFLUENZA A/B
INFLUENZA A, POC: NEGATIVE
INFLUENZA B, POC: NEGATIVE

## 2017-04-08 LAB — POCT RAPID STREP A (OFFICE): RAPID STREP A SCREEN: NEGATIVE

## 2017-04-08 MED ORDER — ACETAMINOPHEN 500 MG PO TABS: 500.0000 mg | ORAL_TABLET | Freq: Four times a day (QID) | ORAL | 0 refills | Status: DC | PRN

## 2017-04-08 MED ORDER — GUAIFENESIN-DM 100-10 MG/5ML PO SYRP: 5.0000 mL | ORAL_SOLUTION | ORAL | 0 refills | Status: DC | PRN

## 2017-04-08 MED ORDER — OSELTAMIVIR PHOSPHATE 75 MG PO CAPS: 75.0000 mg | ORAL_CAPSULE | Freq: Two times a day (BID) | ORAL | 0 refills | Status: DC

## 2017-04-08 MED FILL — OSELTAMIVIR PHOS 75 MG CAP: 75 | 5 days supply | Qty: 10 | Fill #0

## 2017-04-08 MED FILL — ROBAFEN-DM SYRUP: 100-10 | 4 days supply | Qty: 118 | Fill #0

## 2017-04-08 NOTE — Progress Notes (Signed)
Subjective:    Patient ID: Barbara Waller, female    DOB: 1985-12-07, 31 y.o.   MRN: [id number]  HPI Barbara Waller, a 31 year old female that presents complaining of flu like symptoms over the past 4 days.  Symptoms include congestion, cough, fever, irritability, sore throat and swollen glands. Onset of symptoms was 4 days ago, and have been worsening since that time. Patient has been taking Thera Flu and OTC analgesics without satisfactory relief. She also c/o achiness, congestion, lightheadedness, post nasal drip and productive cough with  white colored sputum.   Past Medical History:  Diagnosis Date  . 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 Obesity   . Vaginal Pap smear, abnormal     Social History   Socioeconomic History  . Marital status: Married    Spouse name: Not on file  . Number of children: Not on file  . Years of education: Not on file  . Highest education level: Not on file  Social Needs  . Financial resource strain: Not on file  . Food insecurity - worry: Not on file  . Food insecurity - inability: Not on file  . Transportation needs - medical: Not on file  . Transportation needs - non-medical: Not on file  Occupational History  . Not on file  Tobacco Use  . Smoking status: Never Smoker  . Smokeless tobacco: Never Used  Substance and Sexual Activity  . Alcohol use: No  . Drug use: No  . Sexual activity: Yes    Partners: Male  Other Topics Concern  . Not on file  Social History Narrative  . Not on file   Immunization History  Administered Date(s) Administered  . Influenza Split 03/24/2011  . Tdap 06/20/2014    Review of Systems  Constitutional: Positive for fatigue and fever.  HENT: Positive for congestion, facial swelling, postnasal drip, rhinorrhea and sinus pain.   Eyes: Negative.   Respiratory: Negative.   Cardiovascular: Negative.  Negative for chest pain, palpitations and leg swelling.   Gastrointestinal: Negative.  Negative for constipation and diarrhea.  Endocrine: Negative.   Genitourinary: Negative.   Musculoskeletal: Negative.   Allergic/Immunologic: Negative.   Neurological: Negative.   Psychiatric/Behavioral: Negative.        Objective:   Physical Exam  Constitutional: She has a sickly appearance.  HENT:  Right Ear: Tympanic membrane is erythematous.  Left Ear: Tympanic membrane is erythematous.  Nose: Mucosal edema present.  Mouth/Throat: Oropharyngeal exudate present.  Eyes: Pupils are equal, round, and reactive to light.  Neck: Normal range of motion. Neck supple.  Cardiovascular: Normal rate and regular rhythm.  Pulmonary/Chest: Effort normal and breath sounds normal.  Abdominal: Soft. Normal appearance.      BP 103/70 (BP Location: Left Arm, Patient Position: Sitting, Cuff Size: Normal)   Pulse (!) 125 Comment: manually  Temp 99.8 F (37.7 C) (Oral)   Resp 18   Ht 5\' 6"  (1.676 m)   Wt 176 lb (79.8 kg)   LMP 04/06/2017   SpO2 100%   BMI 28.41 kg/m  Assessment & Plan:  Sore throat Rinse with warm salt water gargles as needed - Culture, Group A Strep - acetaminophen (TYLENOL) 500 MG tablet; Take 1 tablet (500 mg total) by mouth every 6 (six) hours as needed.  Dispense: 30 tablet; Refill: 0 - Influenza A/B - Rapid Strep A  Fever and chills - acetaminophen (TYLENOL) 500 MG tablet; Take 1 tablet (500  mg total) by mouth every 6 (six) hours as needed.  Dispense: 30 tablet; Refill: 0 - Influenza A/B - Rapid Strep A   Flu-like symptoms - oseltamivir (TAMIFLU) 75 MG capsule; Take 1 capsule (75 mg total) by mouth 2 (two) times daily.  Dispense: 10 capsule; Refill: 0 - Influenza A/B - Rapid Strep A  Cough - guaiFENesin-dextromethorphan (ROBITUSSIN DM) 100-10 MG/5ML syrup; Take 5 mLs by mouth every 4 (four) hours as needed for cough.  Dispense: 118 mL; Refill: 0 - Influenza A/B - Rapid Strep A  RTC:  The patient was given clear instructions  to go to ER or return to medical center if symptoms do not improve, worsen or new problems develop. The patient verbalized understanding.    Nolon NationsLachina Moore Raelan Burgoon  MSN, FNP-C Patient Care [facility] [address]  [place], [place] [address] [phone]

## 2017-04-08 NOTE — Patient Instructions (Signed)
Flu like symptoms: will start Tamiflu 75 mg every  Minimize contact with others Saline nose drops for nasal congestion Increase fluid intake and rest Humidify air to prevent drying of respiratory secretions  Cough, Adult A cough helps to clear your throat and lungs. A cough may last only 2-3 weeks (acute), or it may last longer than 8 weeks (chronic). Many different things can cause a cough. A cough may be a sign of an illness or another medical condition. Follow these instructions at home:  Pay attention to any changes in your cough.  Take medicines only as told by your doctor. ? If you were prescribed an antibiotic medicine, take it as told by your doctor. Do not stop taking it even if you start to feel better. ? Talk with your doctor before you try using a cough medicine.  Drink enough fluid to keep your pee (urine) clear or pale yellow.  If the air is dry, use a cold steam vaporizer or humidifier in your home.  Stay away from things that make you cough at work or at home.  If your cough is worse at night, try using extra pillows to raise your head up higher while you sleep.  Do not smoke, and try not to be around smoke. If you need help quitting, ask your doctor.  Do not have caffeine.  Do not drink alcohol.  Rest as needed. Contact a doctor if:  You have new problems (symptoms).  You cough up yellow fluid (pus).  Your cough does not get better after 2-3 weeks, or your cough gets worse.  Medicine does not help your cough and you are not sleeping well.  You have pain that gets worse or pain that is not helped with medicine.  You have a fever.  You are losing weight and you do not know why.  You have night sweats. Get help right away if:  You cough up blood.  You have trouble breathing.  Your heartbeat is very fast. This information is not intended to replace advice given to you by your health care provider. Make sure you discuss any questions you have with  your health care provider. Document Released: 12/12/2010 Document Revised: 09/06/2015 Document Reviewed: 06/07/2014 Elsevier Interactive Patient Education  2018 Elsevier Inc.  Sore Throat When you have a sore throat, your throat may:  Hurt.  Burn.  Feel irritated.  Feel scratchy.  Many things can cause a sore throat, including:  An infection.  Allergies.  Dryness in the air.  Smoke or pollution.  Gastroesophageal reflux disease (GERD).  A tumor.  A sore throat can be the first sign of another sickness. It can happen with other problems, like coughing or a fever. Most sore throats go away without treatment. Follow these instructions at home:  Take over-the-counter medicines only as told by your doctor.  Drink enough fluids to keep your pee (urine) clear or pale yellow.  Rest when you feel you need to.  To help with pain, try: ? Sipping warm liquids, such as broth, herbal tea, or warm water. ? Eating or drinking cold or frozen liquids, such as frozen ice pops. ? Gargling with a salt-water mixture 3-4 times a day or as needed. To make a salt-water mixture, add -1 tsp of salt in 1 cup of warm water. Mix it until you cannot see the salt anymore. ? Sucking on hard candy or throat lozenges. ? Putting a cool-mist humidifier in your bedroom at night. ? Sitting in the  bathroom with the door closed for 5-10 minutes while you run hot water in the shower.  Do not use any tobacco products, such as cigarettes, chewing tobacco, and e-cigarettes. If you need help quitting, ask your doctor. Contact a doctor if:  You have a fever for more than 2-3 days.  You keep having symptoms for more than 2-3 days.  Your throat does not get better in 7 days.  You have a fever and your symptoms suddenly get worse. Get help right away if:  You have trouble breathing.  You cannot swallow fluids, soft foods, or your saliva.  You have swelling in your throat or neck that gets worse.  You  keep feeling like you are going to throw up (vomit).  You keep throwing up. This information is not intended to replace advice given to you by your health care provider. Make sure you discuss any questions you have with your health care provider. Document Released: 01/08/2008 Document Revised: 11/25/2015 Document Reviewed: 01/19/2015 Elsevier Interactive Patient Education  [employer]2018 Elsevier Inc.

## 2017-04-11 ENCOUNTER — Telehealth: Payer: Self-pay | Admitting: Family Medicine

## 2017-04-11 ENCOUNTER — Other Ambulatory Visit: Payer: Self-pay | Admitting: Family Medicine

## 2017-04-11 DIAGNOSIS — J020 Streptococcal pharyngitis: Secondary | ICD-10-CM

## 2017-04-11 LAB — CULTURE, GROUP A STREP: STREP A CULTURE: POSITIVE — AB

## 2017-04-11 MED ORDER — AMOXICILLIN 500 MG PO CAPS: 500.0000 mg | ORAL_CAPSULE | Freq: Two times a day (BID) | ORAL | 0 refills | Status: AC

## 2017-04-11 NOTE — Progress Notes (Signed)
Reviewed throat culture. Positive for strep A. Will treat as follows:  Meds ordered this encounter  Medications  . amoxicillin (AMOXIL) 500 MG capsule    Sig: Take 1 capsule (500 mg total) by mouth 2 (two) times daily for 10 days.    Dispense:  20 capsule    Refill:  0    Nolon NationsLachina Moore Sammi Stolarz  MSN, FNP-C Patient [facility] [address]  [place], [place] [address] [phone]

## 2017-04-11 NOTE — Telephone Encounter (Signed)
Barbara DraftsVanessa Waller, a 31 year old female presented on 04/08/2017 for sore throat. Rapid Strep test was negative and swab was sent for strep culture. Strep culture positive. Will start Amoxicillin 500 mg BID for 10 days. Patient expressed understanding.    [name]  MSN, FNP-C Patient Care [facility] [address]  [place], [place] [address] [phone]

## 2017-06-23 ENCOUNTER — Telehealth: Payer: Self-pay

## 2017-06-23 MED ORDER — NORGESTIMATE-ETH ESTRADIOL 0.25-35 MG-MCG PO TABS: 1.0000 | ORAL_TABLET | Freq: Every day | ORAL | 11 refills | Status: DC

## 2017-06-23 MED FILL — MONO-LINYAH 28 TABLET: 0.25-35 | 28 days supply | Qty: 28 | Fill #0

## 2017-06-23 NOTE — Telephone Encounter (Signed)
Refill sent into pharmacy. Thanks!  

## 2017-08-03 ENCOUNTER — Ambulatory Visit (INDEPENDENT_AMBULATORY_CARE_PROVIDER_SITE_OTHER): Payer: Self-pay | Admitting: Family Medicine

## 2017-08-03 VITALS — BP 116/75 | HR 77 | Temp 98.4°F | Resp 16 | Ht 66.0 in | Wt 180.0 lb

## 2017-08-03 DIAGNOSIS — Z0000 Encounter for general adult medical examination without abnormal findings: Secondary | ICD-10-CM

## 2017-08-03 LAB — POCT URINALYSIS DIPSTICK
Bilirubin, UA: NEGATIVE
Blood, UA: NEGATIVE
Glucose, UA: NEGATIVE
Ketones, UA: 15
LEUKOCYTES UA: NEGATIVE
NITRITE UA: NEGATIVE
PROTEIN UA: NEGATIVE
Spec Grav, UA: >=1.03 — AB (ref 1.010–1.025)
Urobilinogen, UA: 0.2 E.U./dL
pH, UA: 5.5 (ref 5.0–8.0)

## 2017-08-03 LAB — POCT GLYCOSYLATED HEMOGLOBIN (HGB A1C): Hemoglobin A1C: 5.3

## 2017-08-03 NOTE — Progress Notes (Signed)
ANNUAL PREVENTATIVE VISIT   Subjective:  Barbara Waller, a 32 year old female that presents for annual physical exam.  Patient says that she has not had a physical in greater than a year.  She is up-to-date with Pap smear and vaccinations. Patient has not had eyes examined in greater than 1 year. She does not have regular dental follow up due to insurance constraints. Barbara Waller does not exercise routinely. Body mass index is 29.05 kg/m. She generally feels well and is without complaint.  Lab Results  Component Value Date   HGBA1C 5.5 07/08/2016   Patient is on Vitamin D supplement.   No results found for: VD25OH     Names of Other Physician/Practitioners you currently use: 1. [facility] here for primary care  Patient Care Team: Massie MaroonHollis, Arlean Thies M, FNP as PCP - General (Family Medicine)   Medication Review: Current Outpatient Medications on File Prior to Visit  Medication Sig Dispense Refill  . ibuprofen (ADVIL,MOTRIN) 200 MG tablet Take 200 mg by mouth every 6 (six) hours as needed.    . norgestimate-ethinyl estradiol (ORTHO-CYCLEN, 28,) 0.25-35 MG-MCG tablet Take 1 tablet by mouth daily. 1 Package 11   No current facility-administered medications on file prior to visit.     Current Problems (verified) Patient Active Problem List   Diagnosis Date Noted  . Chronic midline low back pain without sciatica 05/16/2016  . 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 Prediabetes 01/03/2016  . Left breast abscess 06/26/2015  . BMI 32.0-32.9,adult 06/26/2015  . Obesity 06/25/2015  . Angioedema of lips 10/17/2014    Screening Tests Health Maintenance  Topic Date Due  . PAP SMEAR  01/16/2017  . TETANUS/TDAP  06/19/2024  . HIV Screening  Completed    Immunization History  Administered Date(s) Administered  . Influenza Split 03/24/2011  . Tdap 06/20/2014     Allergies as of 08/03/2017      Reactions   Ferrous Sulfate Swelling   Shellfish Allergy Swelling        Medication List        Accurate as of 08/03/17  4:16 PM. Always use your most recent med list.          ibuprofen 200 MG tablet Commonly known as:  ADVIL,MOTRIN Take 200 mg by mouth every 6 (six) hours as needed.   norgestimate-ethinyl estradiol 0.25-35 MG-MCG tablet Commonly known as:  ORTHO-CYCLEN (28) Take 1 tablet by mouth daily.       Past Surgical History:  Procedure Laterality Date  . axilla abscess    . COLOSTOMY    . COLPOSCOPY     Family History  Problem Relation Age of Onset  . Diabetes Mother   . Hypertension Mother   . Diabetes Maternal Grandmother   . Anesthesia problems Neg Hx     History reviewed: allergies, current medications, past family history, past medical history, past social history, past surgical history and problem list Tobacco Social History   Tobacco Use  . Smoking status: Never Smoker  . Smokeless tobacco: Never Used  Substance Use Topics  . Alcohol use: No  . Drug use: No   She does not smoke.   Alcohol Current alcohol use: none  Caffeine Current caffeine use: denies use  Exercise Current exercise: housecleaning and no regular exercise  Nutrition/Diet Current diet: in general, a "healthy" diet    Cardiac risk factors: mild obesity   Depression screen [facility] 2[facility]9 08/03/2017 04/08/2017 01/08/2017 07/08/2016 05/16/2016  Decreased Interest 0 0 0 0  0  Down, Depressed, Hopeless 0 0 0 0 0  PHQ - 2 Score 0 0 0 0 0    Objective:     Blood pressure 116/75, pulse 77, temperature 98.4 F (36.9 C), temperature source Oral, resp. rate 16, height 5\' 6"  (1.676 m), weight 180 lb (81.6 kg), last menstrual period 07/25/2017, SpO2 100 %. Body mass index is 29.05 kg/m.  General appeHEENT: normocephalic, sclerae anicteric, TMs pearly, nares patent, no discharge or erythema, pharynx normal Oral cavity: MMM, no lesions Neck: supple, no lymphadenopathy, no thyromegaly, no masses Heart: RRR, normal S1, S2, no murmurs Lungs: CTA bilaterally, no  wheezes, rhonchi, or rales Abdomen: +bs, soft, non tender, non distended, no masses, no hepatomegaly, no splenomegaly Musculoskeletal: nontender, no swelling, no obvious deformity Extremities: no edema, no cyanosis, no clubbing Pulses: 2+ symmetric, upper and lower extremities, normal cap refill Neurological: alert, oriented x 3, CN2-12 intact, strength normal upper extremities and lower extremities, sensation normal throughout, DTRs 2+ throughout, no cerebellar signs, gait normal  Psychiatric: normal affect, behavior normal, pleasant   Assessment:  Patient denies any difficulties at home. No trouble with ADLs, depression or falls. No recent changes to vision or hearing. Is UTD with immunizations. Is UTD with screening. .     Plan:   During the course of the visit the patient was educated and counseled about appropriate screening and preventive services including:   Diabetes screening Nutrition counseling  Conditions/risks identified: BMI: Discussed weight loss, diet, and increase physical activity.  Increase physical activity: AHA recommends 150 minutes of physical activity a week.   Physical exam, annual - Basic Metabolic Panel - QuantiFERON-TB Gold Plus - CBC - TSH - Hemoglobin A1c - Urinalysis Dipstick - Vitamin D, 25-hydroxy - HgB A1c   RTC; 1 year for physical exam  Nolon Nations  MSN, FNP-C Patient Care [facility] [address] [place], [place] [address] [phone]

## 2017-08-03 NOTE — Patient Instructions (Signed)
Will follow up by phone with any abnormal laboratory results.  Recommend a lowfat, low carbohydrate diet divided over 5-6 small meals, increase water intake to 6-8 glasses, and 150 minutes per week of cardiovascular exercise.    Exercising to Stay Healthy Exercising regularly is important. It has many health benefits, such as:  Improving your overall fitness, flexibility, and endurance.  Increasing your bone density.  Helping with weight control.  Decreasing your body fat.  Increasing your muscle strength.  Reducing stress and tension.  Improving your overall health.  In order to become healthy and stay healthy, it is recommended that you do moderate-intensity and vigorous-intensity exercise. You can tell that you are exercising at a moderate intensity if you have a higher heart rate and faster breathing, but you are still able to hold a conversation. You can tell that you are exercising at a vigorous intensity if you are breathing much harder and faster and cannot hold a conversation while exercising. How often should I exercise? Choose an activity that you enjoy and set realistic goals. Your health care provider can help you to make an activity plan that works for you. Exercise regularly as directed by your health care provider. This may include:  Doing resistance training twice each week, such as: ? Push-ups. ? Sit-ups. ? Lifting weights. ? Using resistance bands.  Doing a given intensity of exercise for a given amount of time. Choose from these options: ? 150 minutes of moderate-intensity exercise every week. ? 75 minutes of vigorous-intensity exercise every week. ? A mix of moderate-intensity and vigorous-intensity exercise every week.  Children, pregnant women, people who are out of shape, people who are overweight, and older adults may need to consult a health care provider for individual recommendations. If you have any sort of medical condition, be sure to consult your  health care provider before starting a new exercise program. What are some exercise ideas? Some moderate-intensity exercise ideas include:  Walking at a rate of 1 mile in 15 minutes.  Biking.  Hiking.  Golfing.  Dancing.  Some vigorous-intensity exercise ideas include:  Walking at a rate of at least 4.5 miles per hour.  Jogging or running at a rate of 5 miles per hour.  Biking at a rate of at least 10 miles per hour.  Lap swimming.  Roller-skating or in-line skating.  Cross-country skiing.  Vigorous competitive sports, such as football, basketball, and soccer.  Jumping rope.  Aerobic dancing.  What are some everyday activities that can help me to get exercise?  Yard work, such as: ? Pushing a Surveyor, mininglawn mower. ? Raking and bagging leaves.  Washing and waxing your car.  Pushing a stroller.  Shoveling snow.  Gardening.  Washing windows or floors. How can I be more active in my day-to-day activities?  Use the stairs instead of the elevator.  Take a walk during your lunch break.  If you drive, park your car farther away from work or school.  If you take public transportation, get off one stop early and walk the rest of the way.  Make all of your phone calls while standing up and walking around.  Get up, stretch, and walk around every 30 minutes throughout the day. What guidelines should I follow while exercising?  Do not exercise so much that you hurt yourself, feel dizzy, or get very short of breath.  Consult your health care provider before starting a new exercise program.  Wear comfortable clothes and shoes with good support.  Drink plenty of water while you exercise to prevent dehydration or heat stroke. Body water is lost during exercise and must be replaced.  Work out until you breathe faster and your heart beats faster. This information is not intended to replace advice given to you by your health care provider. Make sure you discuss any questions  you have with your health care provider. Document Released: 05/03/2010 Document Revised: 09/06/2015 Document Reviewed: 09/01/2013 Elsevier Interactive Patient Education  [employer].

## 2017-08-04 ENCOUNTER — Telehealth: Payer: Self-pay

## 2017-08-04 ENCOUNTER — Other Ambulatory Visit: Payer: Self-pay | Admitting: Family Medicine

## 2017-08-04 DIAGNOSIS — E559 Vitamin D deficiency, unspecified: Secondary | ICD-10-CM

## 2017-08-04 DIAGNOSIS — D649 Anemia, unspecified: Secondary | ICD-10-CM | POA: Insufficient documentation

## 2017-08-04 HISTORY — DX: Anemia, unspecified: D64.9

## 2017-08-04 HISTORY — DX: Vitamin D deficiency, unspecified: E55.9

## 2017-08-04 MED ORDER — ERGOCALCIFEROL 1.25 MG (50000 UT) PO CAPS: 50000.0000 [IU] | ORAL_CAPSULE | ORAL | 1 refills | Status: DC

## 2017-08-04 MED FILL — VIT D2 1.25 MG (50,000 UNIT: 1.25 MG | 56 days supply | Qty: 8 | Fill #0

## 2017-08-04 NOTE — Telephone Encounter (Signed)
-----   Message from Massie MaroonLachina M Hollis, [place]NP sent at 08/04/2017  1:17 PM EDT ----- Regarding: lab results Please inform patient that vitamin D is decreased, which is consistent with deficiency. Will start Drisdol 50,000 weekly. Also, she is mildly anemic. Recommend an iron rich diet (beans, green leafy veggies, increased proteins, and iron fortified cereals).   Nolon NationsLachina Moore Hollis  MSN, FNP-C Patient Care [facility] [address]  [place], [place] [address] [phone]

## 2017-08-04 NOTE — Telephone Encounter (Signed)
Called and spoke with patient, advised that vitamin D is decreased and that she needs to start vitamin d 50,000 units once weekly. Advised that she is also mildly anemic and should eat an iron rich diet to help with that. Patient verbalized understanding and will keep next scheduled follow up. Thanks!

## 2017-08-04 NOTE — Progress Notes (Signed)
Meds ordered this encounter  Medications  . ergocalciferol (DRISDOL) 50000 units capsule    Sig: Take 1 capsule (50,000 Units total) by mouth once a week.    Dispense:  30 capsule    Refill:  1   Orders Placed This Encounter  Procedures  . Vitamin D, 25-hydroxy    Standing Status:   Future    Standing Expiration Date:   08/05/2018    Nolon NationsLachina Moore Hollis  MSN, FNP-C Patient Care [facility] [address]  [place], [place] [address] [phone]

## 2017-08-07 LAB — VITAMIN D 25 HYDROXY (VIT D DEFICIENCY, FRACTURES): Vit D, 25-Hydroxy: 8.5 ng/mL — ABNORMAL LOW (ref 30.0–100.0)

## 2017-08-07 LAB — TSH: TSH: 0.569 u[IU]/mL (ref 0.450–4.500)

## 2017-08-07 LAB — BASIC METABOLIC PANEL
BUN/Creatinine Ratio: 8 — ABNORMAL LOW (ref 9–23)
BUN: 9 mg/dL (ref 6–20)
CO2: 26 mmol/L (ref 20–29)
CREATININE: 1.2 mg/dL — AB (ref 0.57–1.00)
Calcium: 8.9 mg/dL (ref 8.7–10.2)
Chloride: 102 mmol/L (ref 96–106)
GFR calc Af Amer: 69 mL/min/{1.73_m2} (ref >59)
GFR, EST NON AFRICAN AMERICAN: 60 mL/min/{1.73_m2} (ref >59)
Glucose: 75 mg/dL (ref 65–99)
Potassium: 3.6 mmol/L (ref 3.5–5.2)
SODIUM: 141 mmol/L (ref 134–144)

## 2017-08-07 LAB — QUANTIFERON-TB GOLD PLUS
QuantiFERON Mitogen Value: >10 IU/mL
QuantiFERON Nil Value: 0.02 IU/mL
QuantiFERON TB1 Ag Value: 0.02 IU/mL
QuantiFERON TB2 Ag Value: 0.01 IU/mL
QuantiFERON-TB Gold Plus: NEGATIVE

## 2017-08-07 LAB — CBC
HEMATOCRIT: 34.2 % (ref 34.0–46.6)
HEMOGLOBIN: 11 g/dL — AB (ref 11.1–15.9)
MCH: 25.1 pg — ABNORMAL LOW (ref 26.6–33.0)
MCHC: 32.2 g/dL (ref 31.5–35.7)
MCV: 78 fL — ABNORMAL LOW (ref 79–97)
Platelets: 387 10*3/uL — ABNORMAL HIGH (ref 150–379)
RBC: 4.39 x10E6/uL (ref 3.77–5.28)
RDW: 14.6 % (ref 12.3–15.4)
WBC: 5.2 10*3/uL (ref 3.4–10.8)

## 2017-08-08 ENCOUNTER — Encounter: Payer: Self-pay | Admitting: Family Medicine

## 2018-02-24 MED FILL — MONO-LINYAH 28 TABLET: 0.25-35 | 28 days supply | Qty: 28 | Fill #1

## 2018-05-25 ENCOUNTER — Ambulatory Visit (INDEPENDENT_AMBULATORY_CARE_PROVIDER_SITE_OTHER): Payer: Self-pay | Admitting: Family Medicine

## 2018-05-25 ENCOUNTER — Other Ambulatory Visit (HOSPITAL_COMMUNITY)
Admission: RE | Admit: 2018-05-25 | Discharge: 2018-05-25 | Disposition: A | Discharge status: Home / Self Care | Payer: Self-pay | Source: Other Acute Inpatient Hospital | Attending: Family Medicine | Admitting: Family Medicine

## 2018-05-25 ENCOUNTER — Telehealth: Payer: Self-pay

## 2018-05-25 ENCOUNTER — Encounter: Payer: Self-pay | Admitting: Family Medicine

## 2018-05-25 VITALS — BP 104/66 | HR 100 | Temp 99.3°F | Ht 66.0 in | Wt 198.2 lb

## 2018-05-25 DIAGNOSIS — Z09 Encounter for follow-up examination after completed treatment for conditions other than malignant neoplasm: Secondary | ICD-10-CM

## 2018-05-25 DIAGNOSIS — R52 Pain, unspecified: Secondary | ICD-10-CM

## 2018-05-25 DIAGNOSIS — R6889 Other general symptoms and signs: Secondary | ICD-10-CM | POA: Insufficient documentation

## 2018-05-25 DIAGNOSIS — J101 Influenza due to other identified influenza virus with other respiratory manifestations: Secondary | ICD-10-CM

## 2018-05-25 DIAGNOSIS — J029 Acute pharyngitis, unspecified: Secondary | ICD-10-CM

## 2018-05-25 HISTORY — DX: Acute pharyngitis, unspecified: J02.9

## 2018-05-25 HISTORY — DX: Influenza due to other identified influenza virus with other respiratory manifestations: J10.1

## 2018-05-25 HISTORY — DX: Other general symptoms and signs: R68.89

## 2018-05-25 LAB — POCT RAPID STREP A (OFFICE): Rapid Strep A Screen: NEGATIVE

## 2018-05-25 LAB — INFLUENZA PANEL BY PCR (TYPE A & B)
Influenza A By PCR: NEGATIVE
Influenza B By PCR: POSITIVE — AB

## 2018-05-25 MED ORDER — OSELTAMIVIR PHOSPHATE 75 MG PO CAPS: 75.0000 mg | ORAL_CAPSULE | Freq: Two times a day (BID) | ORAL | 0 refills | Status: AC

## 2018-05-25 MED FILL — OSELTAMIVIR PHOSPHATE 75 MG: 75 | 5 days supply | Qty: 10 | Fill #0

## 2018-05-25 NOTE — Patient Instructions (Addendum)
Rehydration, Adult Rehydration is the replacement of body fluids and salts and minerals (electrolytes) that are lost during dehydration. Dehydration is when there is not enough fluid or water in the body. This happens when you lose more fluids than you take in. Common causes of dehydration include:  Vomiting.  Diarrhea.  Excessive sweating, such as from heat exposure or exercise.  Taking medicines that cause the body to lose excess fluid (diuretics).  Impaired kidney function.  Not drinking enough fluid.  Certain illnesses or infections.  Certain poorly controlled long-term (chronic) illnesses, such as diabetes, heart disease, and kidney disease.  Symptoms of mild dehydration may include thirst, dry lips and mouth, dry skin, and dizziness. Symptoms of severe dehydration may include increased heart rate, confusion, fainting, and not urinating. You can rehydrate by drinking certain fluids or getting fluids through an IV tube, as told by your health care provider. What are the risks? Generally, rehydration is safe. However, one problem that can happen is taking in too much fluid (overhydration). This is rare. If overhydration happens, it can cause an electrolyte imbalance, kidney failure, or a decrease in salt (sodium) levels in the body. How to rehydrate Follow instructions from your health care provider for rehydration. The kind of fluid you should drink and the amount you should drink depend on your condition.  If directed by your health care provider, drink an oral rehydration solution (ORS). This is a drink designed to treat dehydration that is found in pharmacies and retail stores. ? Make an ORS by following instructions on the package. ? Start by drinking small amounts, about  cup (120 mL) every 5-10 minutes. ? Slowly increase how much you drink until you have taken the amount recommended by your health care provider.  Drink enough clear fluids to keep your urine clear or pale  yellow. If you were instructed to drink an ORS, finish the ORS first, then start slowly drinking other clear fluids. Drink fluids such as: ? Water. Do not drink only water. Doing that can lead to having too little sodium in your body (hyponatremia). ? Ice chips. ? Fruit juice that you have added water to (diluted juice). ? Low-calorie sports drinks.  If you are severely dehydrated, your health care provider may recommend that you receive fluids through an IV tube in the hospital.  Do not take sodium tablets. Doing that can lead to the condition of having too much sodium in your body (hypernatremia). Eating while you rehydrate Follow instructions from your health care provider about what to eat while you rehydrate. Your health care provider may recommend that you slowly begin eating regular foods in small amounts.  Eat foods that contain a healthy balance of electrolytes, such as bananas, oranges, potatoes, tomatoes, and spinach.  Avoid foods that are greasy or contain a lot of fat or sugar.  In some cases, you may get nutrition through a feeding tube that is passed through your nose and into your stomach (nasogastric tube, or NG tube). This may be done if you have uncontrolled vomiting or diarrhea. Beverages to avoid Certain beverages may make dehydration worse. While you rehydrate, avoid:  Alcohol.  Caffeine.  Drinks that contain a lot of sugar. These include: ? High-calorie sports drinks. ? Fruit juice that is not diluted. ? Soda.  Check nutrition labels to see how much sugar or caffeine a beverage contains. Signs of dehydration recovery You may be recovering from dehydration if:  You are urinating more often than before you started   rehydrating.  Your urine is clear or pale yellow.  Your energy level improves.  You vomit less frequently.  You have diarrhea less frequently.  Your appetite improves or returns to normal.  You feel less dizzy or less light-headed.  Your  skin tone and color start to look more normal. Contact a health care provider if:  You continue to have symptoms of mild dehydration, such as: ? Thirst. ? Dry lips. ? Slightly dry mouth. ? Dry, warm skin. ? Dizziness.  You continue to vomit or have diarrhea. Get help right away if:  You have symptoms of dehydration that get worse.  You feel: ? Confused. ? Weak. ? Like you are going to faint.  You have not urinated in 6-8 hours.  You have very dark urine.  You have trouble breathing.  Your heart rate while sitting still is over 100 beats a minute.  You cannot drink fluids without vomiting.  You have vomiting or diarrhea that: ? Gets worse. ? Does not go away.  You have a fever. This information is not intended to replace advice given to you by your health care provider. Make sure you discuss any questions you have with your health care provider. Document Released: 06/23/2011 Document Revised: 10/19/2015 Document Reviewed: 05/25/2015 Elsevier Interactive Patient Education  2019 Else[employer] Influenza, Adult Influenza is also called "the flu." It is an infection in the lungs, nose, and throat (respiratory tract). It is caused by a virus. The flu causes symptoms that are similar to symptoms of a cold. It also causes a high fever and body aches. The flu spreads easily from person to person (is contagious). Getting a flu shot (influenza vaccination) every year is the best way to prevent the flu. What are the causes? This condition is caused by the influenza virus. You can get the virus by:  Breathing in droplets that are in the air from the cough or sneeze of a person who has the virus.  Touching something that has the virus on it (is contaminated) and then touching your mouth, nose, or eyes. What increases the risk? Certain things may make you more likely to get the flu. These include:  Not washing your hands often.  Having close contact with many people during cold  and flu season.  Touching your mouth, eyes, or nose without first washing your hands.  Not getting a flu shot every year. You may have a higher risk for the flu, along with serious problems such as a lung infection (pneumonia), if you:  Are older than 65.  Are pregnant.  Have a weakened disease-fighting system (immune system) because of a disease or taking certain medicines.  Have a long-term (chronic) illness, such as: ? Heart, kidney, or lung disease. ? Diabetes. ? Asthma.  Have a liver disorder.  Are very overweight (morbidly obese).  Have anemia. This is a condition that affects your red blood cells. What are the signs or symptoms? Symptoms usually begin suddenly and last 4-14 days. They may include:  Fever and chills.  Headaches, body aches, or muscle aches.  Sore throat.  Cough.  Runny or stuffy (congested) nose.  Chest discomfort.  Not wanting to eat as much as normal (poor appetite).  Weakness or feeling tired (fatigue).  Dizziness.  Feeling sick to your stomach (nauseous) or throwing up (vomiting). How is this treated? If the flu is found early, you can be treated with medicine that can help reduce how bad the illness is and how long  it lasts (antiviral medicine). This may be given by mouth (orally) or through an IV tube. Taking care of yourself at home can help your symptoms get better. Your doctor may suggest:  Taking over-the-counter medicines.  Drinking plenty of fluids. The flu often goes away on its own. If you have very bad symptoms or other problems, you may be treated in a hospital. Follow these instructions at home:     Activity  Rest as needed. Get plenty of sleep.  Stay home from work or school as told by your doctor. ? Do not leave home until you do not have a fever for 24 hours without taking medicine. ? Leave home only to visit your doctor. Eating and drinking  Take an ORS (oral rehydration solution). This is a drink that is  sold at pharmacies and stores.  Drink enough fluid to keep your pee (urine) pale yellow.  Drink clear fluids in small amounts as you are able. Clear fluids include: ? Water. ? Ice chips. ? Fruit juice that has water added (diluted fruit juice). ? Low-calorie sports drinks.  Eat bland, easy-to-digest foods in small amounts as you are able. These foods include: ? Bananas. ? Applesauce. ? Rice. ? Lean meats. ? Toast. ? Crackers.  Do not eat or drink: ? Fluids that have a lot of sugar or caffeine. ? Alcohol. ? Spicy or fatty foods. General instructions  Take over-the-counter and prescription medicines only as told by your doctor.  Use a cool mist humidifier to add moisture to the air in your home. This can make it easier for you to breathe.  Cover your mouth and nose when you cough or sneeze.  Wash your hands with soap and water often, especially after you cough or sneeze. If you cannot use soap and water, use alcohol-based hand sanitizer.  Keep all follow-up visits as told by your doctor. This is important. How is this prevented?   Get a flu shot every year. You may get the flu shot in late summer, fall, or winter. Ask your doctor when you should get your flu shot.  Avoid contact with people who are sick during fall and winter (cold and flu season). Contact a doctor if:  You get new symptoms.  You have: ? Chest pain. ? Watery poop (diarrhea). ? A fever.  Your cough gets worse.  You start to have more mucus.  You feel sick to your stomach.  You throw up. Get help right away if you:  Have shortness of breath.  Have trouble breathing.  Have skin or nails that turn a bluish color.  Have very bad pain or stiffness in your neck.  Get a sudden headache.  Get sudden pain in your face or ear.  Cannot eat or drink without throwing up. Summary  Influenza ("the flu") is an infection in the lungs, nose, and throat. It is caused by a virus.  Take  over-the-counter and prescription medicines only as told by your doctor.  Getting a flu shot every year is the best way to avoid getting the flu. This information is not intended to replace advice given to you by your health care provider. Make sure you discuss any questions you have with your health care provider. Document Released: 01/08/2008 Document Revised: 09/16/2017 Document Reviewed: 09/16/2017 Elsevier Interactive Patient Education  2019 [employer] Inc.   Ibuprofen tablets and capsules What is this medicine? IBUPROFEN (eye BYOO proe fen) is a non-steroidal anti-inflammatory drug (NSAID). It is used for dental pain,  fever, headaches or migraines, osteoarthritis, rheumatoid arthritis, or painful monthly periods. It can also relieve minor aches and pains caused by a cold, flu, or sore throat. This medicine may be used for other purposes; ask your health care provider or pharmacist if you have questions. COMMON BRAND NAME(S): Advil, Advil Junior Strength, Advil Migraine, Genpril, Ibren, IBU, Midol, Midol Cramps and Body Aches, Motrin, Motrin IB, Motrin Junior Strength, Motrin Migraine Pain, Samson-8, Toxicology Saliva Collection What should I tell my health care provider before I take this medicine? They need to know if you have any of these conditions: -cigarette smoker -coronary artery bypass graft (CABG) surgery within the past 2 weeks -drink more than 3 alcohol-containing drinks a day -heart disease -high blood pressure -history of stomach bleeding -kidney disease -liver disease -lung or breathing disease, like asthma -an unusual or allergic reaction to ibuprofen, aspirin, other NSAIDs, other medicines, foods, dyes, or preservatives -pregnant or trying to get pregnant -breast-feeding How should I use this medicine? Take this medicine by mouth with a glass of water. Follow the directions on the prescription label. Take this medicine with food if your stomach gets upset. Try to not  lie down for at least 10 minutes after you take the medicine. Take your medicine at regular intervals. Do not take your medicine more often than directed. A special MedGuide will be given to you by the pharmacist with each prescription and refill. Be sure to read this information carefully each time. Talk to your pediatrician regarding the use of this medicine in children. Special care may be needed. Overdosage: If you think you have taken too much of this medicine contact a poison control center or emergency room at once. NOTE: This medicine is only for you. Do not share this medicine with others. What if I miss a dose? If you miss a dose, take it as soon as you can. If it is almost time for your next dose, take only that dose. Do not take double or extra doses. What may interact with this medicine? Do not take this medicine with any of the following medications: -cidofovir -ketorolac -methotrexate -pemetrexed This medicine may also interact with the following medications: -alcohol -aspirin -diuretics -lithium -other drugs for inflammation like prednisone -warfarin This list may not describe all possible interactions. Give your health care provider a list of all the medicines, herbs, non-prescription drugs, or dietary supplements you use. Also tell them if you smoke, drink alcohol, or use illegal drugs. Some items may interact with your medicine. What should I watch for while using this medicine? Tell your doctor or healthcare professional if your symptoms do not start to get better or if they get worse. This medicine does not prevent heart attack or stroke. In fact, this medicine may increase the chance of a heart attack or stroke. The chance may increase with longer use of this medicine and in people who have heart disease. If you take aspirin to prevent heart attack or stroke, talk with your doctor or health care professional. Do not take other medicines that contain aspirin, ibuprofen,  or naproxen with this medicine. Side effects such as stomach upset, nausea, or ulcers may be more likely to occur. Many medicines available without a prescription should not be taken with this medicine. This medicine can cause ulcers and bleeding in the stomach and intestines at any time during treatment. Ulcers and bleeding can happen without warning symptoms and can cause death. To reduce your risk, do not smoke cigarettes or drink  alcohol while you are taking this medicine. You may get drowsy or dizzy. Do not drive, use machinery, or do anything that needs mental alertness until you know how this medicine affects you. Do not stand or sit up quickly, especially if you are an older patient. This reduces the risk of dizzy or fainting spells. This medicine can cause you to bleed more easily. Try to avoid damage to your teeth and gums when you brush or floss your teeth. This medicine may be used to treat migraines. If you take migraine medicines for 10 or more days a month, your migraines may get worse. Keep a diary of headache days and medicine use. Contact your healthcare professional if your migraine attacks occur more frequently. What side effects may I notice from receiving this medicine? Side effects that you should report to your doctor or health care professional as soon as possible: -allergic reactions like skin rash, itching or hives, swelling of the face, lips, or tongue -severe stomach pain -signs and symptoms of bleeding such as bloody or black, tarry stools; red or dark-brown urine; spitting up blood or brown material that looks like coffee grounds; red spots on the skin; unusual bruising or bleeding from the eye, gums, or nose -signs and symptoms of a blood clot such as changes in vision; chest pain; severe, sudden headache; trouble speaking; sudden numbness or weakness of the face, arm, or leg -unexplained weight gain or swelling -unusually weak or tired -yellowing of eyes or skin Side  effects that usually do not require medical attention (report to your doctor or health care professional if they continue or are bothersome): -bruising -diarrhea -dizziness, drowsiness -headache -nausea, vomiting This list may not describe all possible side effects. Call your doctor for medical advice about side effects. You may report side effects to FDA at 1-800-FDA-1088. Where should I keep my medicine? Keep out of the reach of children. Store at room temperature between 15 and 30 degrees C (59 and 86 degrees F). Keep container tightly closed. Throw away any unused medicine after the expiration date. NOTE: This sheet is a summary. It may not cover all possible information. If you have questions about this medicine, talk to your doctor, pharmacist, or health care provider.  2019 Elsevier/Gold Standard (2016-12-03 12:43:57)

## 2018-05-25 NOTE — Progress Notes (Signed)
[facility]  Sick Visit  Subjective:  Patient ID: Barbara Waller, female    DOB: 1985-12-04  Age: 33 y.o. MRN: [id number]  CC:  Chief Complaint  Patient presents with  . Chills  . Cough  . Nasal Congestion   HPI Barbara Waller is a 33 year old female who presents for Sick Visit today.   Past Medical History:  Diagnosis Date  . 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 Obesity   . Vaginal Pap smear, abnormal    Current Status: Since her last office visit, she has c/o chills, sore throat, headache, and body aches X 2 days now. She has been sleeping a lot, using Claritin, and Motrin. She denies fevers, recent infections, weight loss, and night sweats. She has not had any headaches, visual changes, dizziness, and falls.   No chest pain, heart palpitations, and shortness of breath reported. No reports of GI problems such as nausea, vomiting, diarrhea, and constipation. She has no reports of blood in stools, dysuria and hematuria. No depression or anxiety reported. She denies pain today.   Past Surgical History:  Procedure Laterality Date  . axilla abscess    . COLOSTOMY    . COLPOSCOPY      Family History  Problem Relation Age of Onset  . Diabetes Mother   . Hypertension Mother   . Diabetes Maternal Grandmother   . Anesthesia problems Neg Hx     Social History   Socioeconomic History  . Marital status: Married    Spouse name: Not on file  . Number of children: Not on file  . Years of education: Not on file  . Highest education level: Not on file  Occupational History  . Not on file  Social Needs  . Financial resource strain: Not on file  . Food insecurity:    Worry: Not on file    Inability: Not on file  . Transportation needs:    Medical: Not on file    Non-medical: Not on file  Tobacco Use  . Smoking status: Never Smoker  . Smokeless tobacco: Never Used    Substance and Sexual Activity  . Alcohol use: No  . Drug use: No  . Sexual activity: Yes    Partners: Male  Lifestyle  . Physical activity:    Days per week: Not on file    Minutes per session: Not on file  . Stress: Not on file  Relationships  . Social connections:    Talks on phone: Not on file    Gets together: Not on file    Attends religious service: Not on file    Active member of club or organization: Not on file    Attends meetings of clubs or organizations: Not on file    Relationship status: Not on file  . Intimate partner violence:    Fear of current or ex partner: Not on file    Emotionally abused: Not on file    Physically abused: Not on file    Forced sexual activity: Not on file  Other Topics Concern  . Not on file  Social History Narrative  . Not on file    Outpatient Medications Prior to Visit  Medication Sig Dispense Refill  . ibuprofen (ADVIL,MOTRIN) 200 MG tablet Take 200 mg by mouth every 6 (six) hours as needed.    . norgestimate-ethinyl estradiol (ORTHO-CYCLEN, 28,) 0.25-35 MG-MCG tablet Take 1  tablet by mouth daily. 1 Package 11  . ergocalciferol (DRISDOL) 50000 units capsule Take 1 capsule (50,000 Units total) by mouth once a week. (Patient not taking: Reported on 05/25/2018) 30 capsule 1   No facility-administered medications prior to visit.     Allergies  Allergen Reactions  . Ferrous Sulfate Swelling  . Shellfish Allergy Swelling    ROS Review of Systems  Constitutional: Negative.   HENT: Negative.   Eyes: Negative.   Respiratory: Negative.   Cardiovascular: Negative.   Gastrointestinal: Negative.   Endocrine: Negative.   Genitourinary: Negative.   Musculoskeletal: Negative.   Skin: Negative.   Allergic/Immunologic: Negative.   Neurological: Negative.   Hematological: Negative.   Psychiatric/Behavioral: Negative.    Objective:    Physical Exam  Constitutional: She is oriented to person, place, and time. She appears  well-developed and well-nourished.  HENT:  Head: Normocephalic and atraumatic.  Eyes: Conjunctivae are normal.  Neck: Normal range of motion. Neck supple.  Cardiovascular: Normal rate, regular rhythm, normal heart sounds and intact distal pulses.  Pulmonary/Chest: Effort normal and breath sounds normal.  Abdominal: Soft. Bowel sounds are normal.  Musculoskeletal: Normal range of motion.  Neurological: She is alert and oriented to person, place, and time. She has normal reflexes.  Skin: Skin is warm and dry.  Psychiatric: She has a normal mood and affect. Her behavior is normal. Judgment and thought content normal.  Nursing note and vitals reviewed.  BP 104/66 (BP Location: Left Arm, Patient Position: Sitting, Cuff Size: Large)   Pulse 100   Temp 99.3 F (37.4 C) (Oral)   Ht 5\' 6"  (1.676 m)   Wt 198 lb 3.2 oz (89.9 kg)   SpO2 98%   BMI 31.99 kg/m  Wt Readings from Last 3 Encounters:  05/25/18 198 lb 3.2 oz (89.9 kg)  08/03/17 180 lb (81.6 kg)  04/08/17 176 lb (79.8 kg)     Health Maintenance Due  Topic Date Due  . PAP SMEAR-Modifier  01/16/2017    There are no preventive care reminders to display for this patient.  Lab Results  Component Value Date   TSH 0.569 08/03/2017   Lab Results  Component Value Date   WBC 5.2 08/03/2017   HGB 11.0 (L) 08/03/2017   HCT 34.2 08/03/2017   MCV 78 (L) 08/03/2017   PLT 387 (H) 08/03/2017   Lab Results  Component Value Date   NA 141 08/03/2017   K 3.6 08/03/2017   CO2 26 08/03/2017   GLUCOSE 75 08/03/2017   BUN 9 08/03/2017   CREATININE 1.20 (H) 08/03/2017   BILITOT 0.4 06/25/2015   ALKPHOS 71 06/25/2015   AST 21 06/25/2015   ALT 39 (H) 06/25/2015   PROT 6.7 06/25/2015   ALBUMIN 3.7 06/25/2015   CALCIUM 8.9 08/03/2017   No results found for: CHOL No results found for: HDL No results found for: LDLCALC No results found for: TRIG No results found for: [facility] Lab Results  Component Value Date   HGBA1C 5.3  08/03/2017   Assessment & Plan:   1. Influenza B She is positive for Influenza B today. We will initiate Tamiflu today. She will increase fluids, take OTC pain medications as needed, and get plenty of rest as needed.  - oseltamivir (TAMIFLU) 75 MG capsule; Take 1 capsule (75 mg total) by mouth 2 (two) times daily for 5 days.  Dispense: 10 capsule; Refill: 0  2. Flu-like symptoms - Influenza panel by PCR (type A & B) - oseltamivir (  TAMIFLU) 75 MG capsule; Take 1 capsule (75 mg total) by mouth 2 (two) times daily for 5 days.  Dispense: 10 capsule; Refill: 0  3. Sore throat Results are negative. Use throat lozenges as needed.  - POCT rapid strep A - Culture, Group A Strep - oseltamivir (TAMIFLU) 75 MG capsule; Take 1 capsule (75 mg total) by mouth 2 (two) times daily for 5 days.  Dispense: 10 capsule; Refill: 0  4. Generalized body aches Use Motrin to aide in relieving body aches.   5. Follow up She will follow up in 6 months to for follow up check up.   Meds ordered this encounter  Medications  . oseltamivir (TAMIFLU) 75 MG capsule    Sig: Take 1 capsule (75 mg total) by mouth 2 (two) times daily for 5 days.    Dispense:  10 capsule    Refill:  0   Raliegh Ip,  MSN, FNP-C [facility] [address] [place], [place] [address] [phone]   Problem List Items Addressed This Visit    None    Visit Diagnoses    Influenza B    -  Primary   Relevant Medications   oseltamivir (TAMIFLU) 75 MG capsule   Flu-like symptoms       Relevant Medications   oseltamivir (TAMIFLU) 75 MG capsule   Other Relevant Orders   Influenza panel by PCR (type A & B)   Sore throat       Relevant Medications   oseltamivir (TAMIFLU) 75 MG capsule   Other Relevant Orders   POCT rapid strep A (Completed)   Culture, Group A Strep   Generalized body aches       Follow up          Meds ordered this encounter  Medications  . oseltamivir (TAMIFLU) 75 MG  capsule    Sig: Take 1 capsule (75 mg total) by mouth 2 (two) times daily for 5 days.    Dispense:  10 capsule    Refill:  0    Follow-up: Return in about 6 months (around 11/23/2018).    Kallie Locks, FNP

## 2018-05-27 ENCOUNTER — Other Ambulatory Visit: Payer: Self-pay | Admitting: Family Medicine

## 2018-05-27 NOTE — Telephone Encounter (Signed)
Patient was scheduled to go back to work on 05/27/2018 and she is still not feeling well and would like to have note say she can return on 05/28/2018.

## 2018-05-27 NOTE — Telephone Encounter (Signed)
Fax#[phone]

## 2018-05-28 LAB — CULTURE, GROUP A STREP: Strep A Culture: NEGATIVE

## 2018-05-28 NOTE — Telephone Encounter (Signed)
Letter faxed to patient job per patient request and she will pick paperwork

## 2018-07-09 ENCOUNTER — Other Ambulatory Visit: Payer: Self-pay | Admitting: Family Medicine

## 2018-07-09 MED FILL — MONO-LINYAH 28 TABLET: 0.25-35 | 28 days supply | Qty: 28 | Fill #0

## 2018-08-09 MED FILL — MONO-LINYAH 28 TABLET: 0.25-35 | 28 days supply | Qty: 28 | Fill #1

## 2018-09-15 MED FILL — MONO-LINYAH 28 TABLET: 0.25-35 | 28 days supply | Qty: 28 | Fill #2

## 2018-09-30 ENCOUNTER — Telehealth: Payer: Self-pay

## 2018-09-30 NOTE — Telephone Encounter (Signed)
Patient is negative for screening and will be coming in office  

## 2018-10-01 ENCOUNTER — Ambulatory Visit (INDEPENDENT_AMBULATORY_CARE_PROVIDER_SITE_OTHER): Payer: Self-pay | Admitting: Family Medicine

## 2018-10-01 ENCOUNTER — Other Ambulatory Visit: Payer: Self-pay

## 2018-10-01 ENCOUNTER — Encounter: Payer: Self-pay | Admitting: Family Medicine

## 2018-10-01 ENCOUNTER — Other Ambulatory Visit: Payer: Self-pay | Admitting: Family Medicine

## 2018-10-01 VITALS — BP 110/92 | HR 80 | Temp 98.1°F | Ht 66.0 in | Wt 200.2 lb

## 2018-10-01 DIAGNOSIS — Z09 Encounter for follow-up examination after completed treatment for conditions other than malignant neoplasm: Secondary | ICD-10-CM

## 2018-10-01 DIAGNOSIS — N644 Mastodynia: Secondary | ICD-10-CM

## 2018-10-01 DIAGNOSIS — N6002 Solitary cyst of left breast: Secondary | ICD-10-CM

## 2018-10-01 DIAGNOSIS — Z113 Encounter for screening for infections with a predominantly sexual mode of transmission: Secondary | ICD-10-CM

## 2018-10-01 DIAGNOSIS — Z3200 Encounter for pregnancy test, result unknown: Secondary | ICD-10-CM

## 2018-10-01 LAB — POCT URINALYSIS DIP (MANUAL ENTRY)
Bilirubin, UA: NEGATIVE
Blood, UA: NEGATIVE
Glucose, UA: NEGATIVE mg/dL
Ketones, POC UA: NEGATIVE mg/dL
Nitrite, UA: NEGATIVE
Protein Ur, POC: NEGATIVE mg/dL
Spec Grav, UA: >=1.03 — AB (ref 1.010–1.025)
Urobilinogen, UA: 0.2 E.U./dL
pH, UA: 5.5 (ref 5.0–8.0)

## 2018-10-01 LAB — POCT URINE PREGNANCY: Preg Test, Ur: NEGATIVE

## 2018-10-01 NOTE — Patient Instructions (Signed)
Breast Cyst  A breast cyst is a sac in the breast that is filled with fluid. Breast cysts are usually noncancerous (benign). They are common among women, and they are most often located in the upper, outer portion of the breast. One or more cysts may develop. They form when fluid builds up inside of the breast glands. There are several types of breast cysts:  Macrocyst. This is a cyst that is about 2 inches (5.1 cm) across (in diameter).  Microcyst. This is a very small cyst that you cannot feel, but it can be seen with imaging tests such as an X-ray of the breast (mammogram) or ultrasound.  Galactocele. This is a cyst that contains milk. It may develop if you suddenly stop breastfeeding. Breast cysts do not increase your risk of breast cancer. They usually disappear after menopause, unless you take artificial hormones (are on hormone therapy). What are the causes? The exact cause of breast cysts is not known. Possible causes include:  Blockage of tubes (ducts) in the breast glands, which leads to fluid buildup. Duct blockage may result from: ? Fibrocystic breast changes. This is a common, benign condition that occurs when women go through hormonal changes during the menstrual cycle. This is a common cause of multiple breast cysts. ? Overgrowth of breast tissue or breast glands. ? Scar tissue in the breast from previous surgery.  Changes in certain female hormones (estrogen and progesterone). What increases the risk? You may be more likely to develop breast cysts if you have not gone through menopause. What are the signs or symptoms? Symptoms of a breast cyst may include:  Feeling one or more smooth, round, soft lumps (like grapes) in the breast that are easily moveable. The lump(s) may get bigger and more painful before your period and get smaller after your period.  Breast discomfort or pain. How is this diagnosed? A cyst can be felt during a physical exam by your health care provider.  A mammogram and ultrasound will be done to confirm the diagnosis. Fluid may be removed from the cyst with a needle (fine-needle aspiration) and tested to make sure the cyst is not cancerous. How is this treated? Treatment may not be necessary. Your health care provider may monitor the cyst to see if it goes away on its own. If the cyst is uncomfortable or gets bigger, or if you do not like how the cyst makes your breast look, you may need treatment. Treatment may include:  Hormone treatment.  Fine-needle aspiration, to drain fluid from the cyst. There is a chance of the cyst coming back (recurring) after aspiration.  Surgery to remove the cyst. Follow these instructions at home:  See your health care provider regularly. ? Get a yearly physical exam. ? If you are 36-49 years old, get a clinical breast exam every 1-3 years. After age 47, get this exam every year. ? Get mammograms as often as directed.  Do a breast self-exam every month, or as often as directed. Having many breast cysts, or "lumpy" breasts, may make it harder to feel for new lumps. Understand how your breasts normally look and feel, and write down any changes in your breasts so you can tell your health care provider about the changes. A breast self-exam involves: ? Comparing your breasts in the mirror. ? Looking for visible changes in your skin or nipples. ? Feeling for lumps or changes.  Take over-the-counter and prescription medicines only as told by your health care provider.  Wear  a supportive bra, especially when exercising.  Follow instructions from your health care provider about eating and drinking restrictions. ? Avoid caffeine. ? Cut down on salt (sodium) in what you eat and drink, especially before your menstrual period. Too much sodium can cause fluid buildup (retention), breast swelling, and discomfort.  Keep all follow-up visits as told your health care provider. This is important. Contact a health care  provider if:  You feel, or think you feel, a lump in your breast.  You notice that both breasts look or feel different than usual.  Your breast is still causing pain after your menstrual period is over.  You find new lumps or bumps that were not there before.  You feel lumps in your armpit (axilla). Get help right away if:  You have severe pain, tenderness, redness, or warmth in your breast.  You have fluid or blood leaking from your nipple.  Your breast lump becomes hard and painful.  You notice dimpling or wrinkling of the breast or nipple. This information is not intended to replace advice given to you by your health care provider. Make sure you discuss any questions you have with your health care provider. Document Released: 03/31/2005 Document Revised: 12/21/2015 Document Reviewed: 12/21/2015 Elsevier Interactive Patient Education  2019 [employer] Breast Tenderness Breast tenderness is a common problem for women of all ages. Breast tenderness may cause mild discomfort to severe pain. The pain usually comes and goes in association with your menstrual cycle, but it can be constant. Breast tenderness has many possible causes, including hormone changes and some medicines. Your health care provider may order tests, such as a mammogram or an ultrasound, to check for any unusual findings. Having breast tenderness usually does not mean that you have breast cancer. Follow these instructions at home: Sometimes, reassurance that you do not have breast cancer is all that is needed. In general, follow these home care instructions: Managing pain and discomfort   If directed, apply ice to the area: ? Put ice in a plastic bag. ? Place a towel between your skin and the bag. ? Leave the ice on for 20 minutes, 2-3 times a day.  Make sure you are wearing a supportive bra, especially during exercise. You may also want to wear a supportive bra while sleeping if your breasts are very tender.  Medicines  Take over-the-counter and prescription medicines only as told by your health care provider. If the cause of your pain is infection, you may be prescribed an antibiotic medicine.  If you were prescribed an antibiotic, take it as told by your health care provider. Do not stop taking the antibiotic even if you start to feel better. General instructions   Your health care provider may recommend that you reduce the amount of fat in your diet. You can do this by: ? Limiting fried foods. ? Cooking foods using methods, such as baking, boiling, grilling, and broiling.  Decrease the amount of caffeine in your diet. You can do this by drinking more water and choosing caffeine-free options.  Keep a log of the days and times when your breasts are most tender.  Ask your health care provider how to do breast exams at home. This will help you notice if you have an unusual growth or lump. Contact a health care provider if:  Any part of your breast is hard, red, and hot to the touch. This may be a sign of infection.  You are not breastfeeding and you have fluid,  especially blood or pus, coming out of your nipples.  You have a fever.  You have a new or painful lump in your breast that remains after your menstrual period ends.  Your pain does not improve or it gets worse.  Your pain is interfering with your daily activities. This information is not intended to replace advice given to you by your health care provider. Make sure you discuss any questions you have with your health care provider. Document Released: 03/13/2008 Document Revised: 12/28/2015 Document Reviewed: 12/28/2015 Elsevier Interactive Patient Education  2019 [employer].

## 2018-10-01 NOTE — Progress Notes (Signed)
Patient [facility]   Sick Visit  Subjective:  Patient ID: Barbara Waller, female    DOB: 08/13/1985  Age: 33 y.o. MRN: [id number]  CC:  Chief Complaint  Patient presents with  . Cyst    left breast  . STI testing    HPI Barbara Waller is a 33 year old female who presents for Sick Visit today.   Past Medical History:  Diagnosis Date  . 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 Obesity   . Vaginal Pap smear, abnormal    Current Status: Since her last office visit, she is doing well with no complaints. She has c/o left breast pain and tenderness X 7 days. She states that cyst is palpated in her lower left breast area. She is not aware on any family history of breast cancer. She states that cyst has decreased in size, but still feels painful and tender. She has not taken medication for symptoms.   She denies fevers, chills, fatigue, recent infections, weight loss, and night sweats. She has not had any headaches, visual changes, dizziness, and falls. No chest pain, heart palpitations, cough and shortness of breath reported. No reports of GI problems such as nausea, vomiting, diarrhea, and constipation. She has no reports of blood in stools, dysuria and hematuria. No depression or anxiety reported. She denies pain today.   Past Surgical History:  Procedure Laterality Date  . axilla abscess    . COLOSTOMY    . COLPOSCOPY      Family History  Problem Relation Age of Onset  . Diabetes Mother   . Hypertension Mother   . Diabetes Maternal Grandmother   . Anesthesia problems Neg Hx     Social History   Socioeconomic History  . Marital status: Married    Spouse name: Not on file  . Number of children: Not on file  . Years of education: Not on file  . Highest education level: Not on file  Occupational History  . Not on file  Social Needs  . Financial resource strain: Not on file   . Food insecurity    Worry: Not on file    Inability: Not on file  . Transportation needs    Medical: Not on file    Non-medical: Not on file  Tobacco Use  . Smoking status: Never Smoker  . Smokeless tobacco: Never Used  Substance and Sexual Activity  . Alcohol use: No  . Drug use: No  . Sexual activity: Yes    Partners: Male  Lifestyle  . Physical activity    Days per week: Not on file    Minutes per session: Not on file  . Stress: Not on file  Relationships  . Social Herbalist on phone: Not on file    Gets together: Not on file    Attends religious service: Not on file    Active member of club or organization: Not on file    Attends meetings of clubs or organizations: Not on file    Relationship status: Not on file  . Intimate partner violence    Fear of current or ex partner: Not on file    Emotionally abused: Not on file    Physically abused: Not on file    Forced sexual activity: Not on file  Other Topics Concern  . Not on file  Social History Narrative  . Not on  file    Outpatient Medications Prior to Visit  Medication Sig Dispense Refill  . Biotin 1000 MCG CHEW Chew by mouth.    Marland Kitchen. MONO-LINYAH 0.25-35 MG-MCG tablet TAKE 1 TABLET BY MOUTH DAILY. 28 tablet 11  . Omega-3 Fatty Acids (FISH OIL) 1000 MG CAPS Take by mouth.    . ergocalciferol (DRISDOL) 50000 units capsule Take 1 capsule (50,000 Units total) by mouth once a week. (Patient not taking: Reported on 05/25/2018) 30 capsule 1  . ibuprofen (ADVIL,MOTRIN) 200 MG tablet Take 200 mg by mouth every 6 (six) hours as needed.     No facility-administered medications prior to visit.     Allergies  Allergen Reactions  . Ferrous Sulfate Swelling  . Shellfish Allergy Swelling    ROS Review of Systems  Constitutional: Negative.   HENT: Negative.   Eyes: Negative.   Respiratory: Negative.   Cardiovascular: Negative.   Gastrointestinal: Negative.   Endocrine: Negative.   Genitourinary: Negative.    Musculoskeletal: Negative.        Left breast pain and tenderness.   Allergic/Immunologic: Negative.   Neurological: Negative.   Hematological: Negative.   Psychiatric/Behavioral: Negative.       Objective:    Physical Exam  Constitutional: She is oriented to person, place, and time. She appears well-developed and well-nourished.  HENT:  Head: Normocephalic and atraumatic.  Eyes: Conjunctivae are normal.  Neck: Normal range of motion. Neck supple.  Cardiovascular: Normal rate, regular rhythm, normal heart sounds and intact distal pulses.  Pulmonary/Chest: Effort normal and breath sounds normal.    Abdominal: Soft. Bowel sounds are normal.  Musculoskeletal: Normal range of motion.  Neurological: She is alert and oriented to person, place, and time. She has normal reflexes.  Skin: Skin is warm and dry.  Psychiatric: She has a normal mood and affect. Her behavior is normal. Judgment and thought content normal.  Nursing note reviewed.   BP (!) 110/92 (BP Location: Left Arm, Patient Position: Sitting, Cuff Size: Large)   Pulse 80   Temp 98.1 F (36.7 C) (Oral)   Ht 5\' 6"  (1.676 m)   Wt 200 lb 3.2 oz (90.8 kg)   LMP 09/17/2018   SpO2 99%   BMI 32.31 kg/m  Wt Readings from Last 3 Encounters:  10/01/18 200 lb 3.2 oz (90.8 kg)  05/25/18 198 lb 3.2 oz (89.9 kg)  08/03/17 180 lb (81.6 kg)     Health Maintenance Due  Topic Date Due  . PAP SMEAR-Modifier  01/16/2017    There are no preventive care reminders to display for this patient.  Lab Results  Component Value Date   TSH 0.569 08/03/2017   Lab Results  Component Value Date   WBC 5.2 08/03/2017   HGB 11.0 (L) 08/03/2017   HCT 34.2 08/03/2017   MCV 78 (L) 08/03/2017   PLT 387 (H) 08/03/2017   Lab Results  Component Value Date   NA 141 08/03/2017   K 3.6 08/03/2017   CO2 26 08/03/2017   GLUCOSE 75 08/03/2017   BUN 9 08/03/2017   CREATININE 1.20 (H) 08/03/2017   BILITOT 0.4 06/25/2015   ALKPHOS 71  06/25/2015   AST 21 06/25/2015   ALT 39 (H) 06/25/2015   PROT 6.7 06/25/2015   ALBUMIN 3.7 06/25/2015   CALCIUM 8.9 08/03/2017   No results found for: CHOL No results found for: HDL No results found for: LDLCALC No results found for: TRIG No results found for: [facility] Lab Results  Component Value Date  HGBA1C 5.3 08/03/2017   Assessment & Plan:   1. Breast pain, left - POCT urine pregnancy - MM DIAG BREAST TOMO BILATERAL; Future  2. Breast cyst, left Mammogram for further assessment.  - POCT urine pregnancy - MM DIAG BREAST TOMO BILATERAL; Future  3. Possible pregnancy Pregnancy test is negative.  - POCT urine pregnancy  4. Screening for STD (sexually transmitted disease) - GC/Chlamydia Probe Amp([facility]) - HIV antibody (with reflex)  5. Follow up She will follow up in 1 month.  - POCT urinalysis dipstick  No orders of the defined types were placed in this encounter.   Orders Placed This Encounter  Procedures  . GC/Chlamydia Probe Amp([facility])  . MM DIAG BREAST TOMO BILATERAL  . HIV antibody (with reflex)  . POCT urinalysis dipstick  . POCT urine pregnancy    Referral Orders  No referral(s) requested today    Raliegh IpNatalie Laakea Pereira,  MSN, FNP-BC [facility] [address]  [place], [place] [id number] [phone]   Problem List Items Addressed This Visit    None    Visit Diagnoses    Breast pain, left    -  Primary   Relevant Orders   POCT urine pregnancy (Completed)   MM DIAG BREAST TOMO BILATERAL   Breast cyst, left       Relevant Orders   POCT urine pregnancy (Completed)   MM DIAG BREAST TOMO BILATERAL   Possible pregnancy       Relevant Orders   POCT urine pregnancy (Completed)   Screening for STD (sexually transmitted disease)       Relevant Orders   GC/Chlamydia Probe Amp([facility])   HIV antibody (with reflex)   Follow up       Relevant Orders   POCT urinalysis dipstick (Completed)      No orders  of the defined types were placed in this encounter.   Follow-up: No follow-ups on file.    Kallie LocksNatalie M Xzavien Harada, FNP

## 2018-10-02 DIAGNOSIS — N644 Mastodynia: Secondary | ICD-10-CM | POA: Insufficient documentation

## 2018-10-02 DIAGNOSIS — N6002 Solitary cyst of left breast: Secondary | ICD-10-CM | POA: Insufficient documentation

## 2018-10-02 HISTORY — DX: Solitary cyst of left breast: N60.02

## 2018-10-02 HISTORY — DX: Mastodynia: N64.4

## 2018-10-02 LAB — HIV ANTIBODY (ROUTINE TESTING W REFLEX): HIV Screen 4th Generation wRfx: NONREACTIVE

## 2018-10-04 LAB — GC/CHLAMYDIA PROBE AMP
Chlamydia trachomatis, NAA: NEGATIVE
Neisseria Gonorrhoeae by PCR: NEGATIVE

## 2018-10-05 ENCOUNTER — Other Ambulatory Visit (HOSPITAL_COMMUNITY): Payer: Self-pay | Admitting: *Deleted

## 2018-10-05 DIAGNOSIS — N644 Mastodynia: Secondary | ICD-10-CM

## 2018-10-12 MED FILL — MONO-LINYAH 28 TABLET: 0.25-35 | 28 days supply | Qty: 28 | Fill #3

## 2018-10-21 ENCOUNTER — Other Ambulatory Visit: Payer: Self-pay

## 2018-10-21 ENCOUNTER — Encounter (HOSPITAL_COMMUNITY): Payer: Self-pay | Admitting: *Deleted

## 2018-10-21 ENCOUNTER — Ambulatory Visit
Admission: RE | Admit: 2018-10-21 | Discharge: 2018-10-21 | Disposition: A | Discharge status: Home / Self Care | Payer: No Typology Code available for payment source | Source: Ambulatory Visit | Attending: Obstetrics and Gynecology | Admitting: Obstetrics and Gynecology

## 2018-10-21 ENCOUNTER — Ambulatory Visit (HOSPITAL_COMMUNITY)
Admission: RE | Admit: 2018-10-21 | Discharge: 2018-10-21 | Disposition: A | Discharge status: Home / Self Care | Payer: Self-pay | Source: Ambulatory Visit | Attending: Obstetrics and Gynecology | Admitting: Obstetrics and Gynecology

## 2018-10-21 ENCOUNTER — Encounter (HOSPITAL_COMMUNITY): Payer: Self-pay

## 2018-10-21 DIAGNOSIS — N644 Mastodynia: Secondary | ICD-10-CM

## 2018-10-21 DIAGNOSIS — N6325 Unspecified lump in the left breast, overlapping quadrants: Secondary | ICD-10-CM

## 2018-10-21 DIAGNOSIS — Z1239 Encounter for other screening for malignant neoplasm of breast: Secondary | ICD-10-CM

## 2018-10-21 HISTORY — DX: Unspecified lump in the left breast, overlapping quadrants: N63.25

## 2018-10-21 NOTE — Patient Instructions (Signed)
Explained breast self awareness with Reuben Likes. Patient did not need a Pap smear today due to last Pap smear was in January or March 2019 per patient. Let her know [facility] will cover Pap smears every 3 years unless has a history of abnormal Pap smears. Referred patient to the [facility] for a diagnostic mammogram and left breast ultrasound. Appointment scheduled for Thursday, October 21, 2018 at 1120. Patient aware of appointment and will be there. Reuben Likes verbalized understanding.  Arleta Ostrum, Arvil Chaco, RN 11:14 AM

## 2018-10-21 NOTE — Progress Notes (Signed)
Complaints of a left breast lump x one month that has decreased in size. Patient complained of left breast pain around the area of the lump x one month that comes and goes. Patient rates the pain at a 6 out of 10.  Pap Smear: Pap smear not completed today. Last Pap smear was in January or March 2019 by Dr. Duke Salvia and normal per patient. Per patient has no history of an abnormal Pap smear. No Pap smear results are in Epic.  Physical exam: Breasts Right breast slightly larger than left breast that per patient is normal for her. No skin abnormalities bilateral breasts. No nipple retraction bilateral breasts. No nipple discharge bilateral breasts. No lymphadenopathy. No lumps palpated right breast. Palpated a pea sized lump within the left breast at 6 o'clock 9 cm from the nipple. Complaints of tenderness when palpated lump within the left breast. Referred patient to the [facility] for a diagnostic mammogram and left breast ultrasound. Appointment scheduled for Thursday, October 21, 2018 at 1120.        Pelvic/Bimanual No Pap smear completed today since last Pap smear was in January or March 2019 per patient. Pap smear not indicated per [facility] guidelines.   Smoking History: Patient has never smoked.  Patient Navigation: Patient education provided. Access to services provided for patient through [facility] program.   Breast and Cervical Cancer Risk Assessment: Patient has no family history of breast cancer, known genetic mutations, or radiation treatment to the chest before age 63. Patient has no history of cervical dysplasia, immunocompromised, or DES exposure in-utero. Breast cancer screening completed. No breast cancer risk calculated due to patient is less than 63 years old.

## 2018-10-22 MED FILL — MONO-LINYAH 28 TABLET: 0.25-35 | 28 days supply | Qty: 28 | Fill #3

## 2018-11-23 ENCOUNTER — Ambulatory Visit: Payer: Self-pay | Admitting: Family Medicine

## 2018-11-25 MED FILL — MONO-LINYAH 28 TABLET: 0.25-35 | 28 days supply | Qty: 28 | Fill #4

## 2018-12-23 MED FILL — MONO-LINYAH 28 TABLET: 0.25-35 | 28 days supply | Qty: 28 | Fill #5

## 2018-12-29 ENCOUNTER — Other Ambulatory Visit: Payer: Self-pay

## 2018-12-29 ENCOUNTER — Ambulatory Visit (INDEPENDENT_AMBULATORY_CARE_PROVIDER_SITE_OTHER): Payer: Self-pay | Admitting: Family Medicine

## 2018-12-29 ENCOUNTER — Encounter: Payer: Self-pay | Admitting: Family Medicine

## 2018-12-29 VITALS — BP 122/76 | HR 74 | Temp 98.5°F | Ht 66.0 in | Wt 199.4 lb

## 2018-12-29 DIAGNOSIS — Z0000 Encounter for general adult medical examination without abnormal findings: Secondary | ICD-10-CM | POA: Diagnosis not present

## 2018-12-29 DIAGNOSIS — Z3201 Encounter for pregnancy test, result positive: Secondary | ICD-10-CM

## 2018-12-29 DIAGNOSIS — Z09 Encounter for follow-up examination after completed treatment for conditions other than malignant neoplasm: Secondary | ICD-10-CM

## 2018-12-29 DIAGNOSIS — Z3200 Encounter for pregnancy test, result unknown: Secondary | ICD-10-CM

## 2018-12-29 LAB — POCT URINE PREGNANCY: Preg Test, Ur: POSITIVE — AB

## 2018-12-29 NOTE — Progress Notes (Signed)
pos

## 2018-12-29 NOTE — Patient Instructions (Signed)
How a Baby Grows During Pregnancy  Pregnancy begins when a female's sperm enters a female's egg (fertilization). Fertilization usually happens in one of the tubes (fallopian tubes) that connect the ovaries to the womb (uterus). The fertilized egg moves down the fallopian tube to the uterus. Once it reaches the uterus, it implants into the lining of the uterus and begins to grow. For the first 10 weeks, the fertilized egg is called an embryo. After 10 weeks, it is called a fetus. As the fetus continues to grow, it receives oxygen and nutrients through tissue (placenta) that grows to support the developing baby. The placenta is the life support system for the baby. It provides oxygen and nutrition and removes waste. Learning as much as you can about your pregnancy and how your baby is developing can help you enjoy the experience. It can also make you aware of when there might be a problem and when to ask questions. How long does a typical pregnancy last? A pregnancy usually lasts 280 days, or about 40 weeks. Pregnancy is divided into three periods of growth, also called trimesters:  First trimester: 0-12 weeks.  Second trimester: 13-27 weeks.  Third trimester: 28-40 weeks. The day when your baby is ready to be born (full term) is your estimated date of delivery. How does my baby develop month by month? First month  The fertilized egg attaches to the inside of the uterus.  Some cells will form the placenta. Others will form the fetus.  The arms, legs, brain, spinal cord, lungs, and heart begin to develop.  At the end of the first month, the heart begins to beat. Second month  The bones, inner ear, eyelids, hands, and feet form.  The genitals develop.  By the end of 8 weeks, all major organs are developing. Third month  All of the internal organs are forming.  Teeth develop below the gums.  Bones and muscles begin to grow. The spine can flex.  The skin is transparent.  Fingernails  and toenails begin to form.  Arms and legs continue to grow longer, and hands and feet develop.  The fetus is about 3 inches (7.6 cm) long. Fourth month  The placenta is completely formed.  The external sex organs, neck, outer ear, eyebrows, eyelids, and fingernails are formed.  The fetus can hear, swallow, and move its arms and legs.  The kidneys begin to produce urine.  The skin is covered with a white, waxy coating (vernix) and very fine hair (lanugo). Fifth month  The fetus moves around more and can be felt for the first time (quickening).  The fetus starts to sleep and wake up and may begin to suck its finger.  The nails grow to the end of the fingers.  The organ in the digestive system that makes bile (gallbladder) functions and helps to digest nutrients.  If your baby is a girl, eggs are present in her ovaries. If your baby is a boy, testicles start to move down into his scrotum. Sixth month  The lungs are formed.  The eyes open. The brain continues to develop.  Your baby has fingerprints and toe prints. Your baby's hair grows thicker.  At the end of the second trimester, the fetus is about 9 inches (22.9 cm) long. Seventh month  The fetus kicks and stretches.  The eyes are developed enough to sense changes in light.  The hands can make a grasping motion.  The fetus responds to sound. Eighth month  All   organs and body systems are fully developed and functioning.  Bones harden, and taste buds develop. The fetus may hiccup.  Certain areas of the brain are still developing. The skull remains soft. Ninth month  The fetus gains about  lb (0.23 kg) each week.  The lungs are fully developed.  Patterns of sleep develop.  The fetus's head typically moves into a head-down position (vertex) in the uterus to prepare for birth.  The fetus weighs 6-9 lb (2.72-4.08 kg) and is 19-20 inches (48.26-50.8 cm) long. What can I do to have a healthy pregnancy and help  my baby develop? General instructions  Take prenatal vitamins as directed by your health care provider. These include vitamins such as folic acid, iron, calcium, and vitamin D. They are important for healthy development.  Take medicines only as directed by your health care provider. Read labels and ask a pharmacist or your health care provider whether over-the-counter medicines, supplements, and prescription drugs are safe to take during pregnancy.  Keep all follow-up visits as directed by your health care provider. This is important. Follow-up visits include prenatal care and screening tests. How do I know if my baby is developing well? At each prenatal visit, your health care provider will do several different tests to check on your health and keep track of your baby's development. These include:  Fundal height and position. ? Your health care provider will measure your growing belly from your pubic bone to the top of the uterus using a tape measure. ? Your health care provider will also feel your belly to determine your baby's position.  Heartbeat. ? An ultrasound in the first trimester can confirm pregnancy and show a heartbeat, depending on how far along you are. ? Your health care provider will check your baby's heart rate at every prenatal visit.  Second trimester ultrasound. ? This ultrasound checks your baby's development. It also may show your baby's gender. What should I do if I have concerns about my baby's development? Always talk with your health care provider about any concerns that you may have about your pregnancy and your baby. Summary  A pregnancy usually lasts 280 days, or about 40 weeks. Pregnancy is divided into three periods of growth, also called trimesters.  Your health care provider will monitor your baby's growth and development throughout your pregnancy.  Follow your health care provider's recommendations about taking prenatal vitamins and medicines during  your pregnancy.  Talk with your health care provider if you have any concerns about your pregnancy or your developing baby. This information is not intended to replace advice given to you by your health care provider. Make sure you discuss any questions you have with your health care provider. Document Released: 09/17/2007 Document Revised: 07/22/2018 Document Reviewed: 02/11/2017 Elsevier Patient Education  2020 Elsevier Inc.  

## 2018-12-29 NOTE — Progress Notes (Signed)
Patient [facility]   Positive Pregnancy Test  Subjective:  Patient ID: Barbara Waller, female    DOB: [birth year]  Age: 33 y.o. MRN: [id number]  CC: No chief complaint on file.   HPI [name] is  33 year old female who presents for labs only, resulting in Positive Pregnancy Test.   Past Medical History:  Diagnosis Date  . 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 Obesity   . Vaginal Pap smear, abnormal    Current Status: Since her last office visit, she has positive pregnancy test today. She states that she is planning on keeping her baby. Her daughter is 33 years old, and her son is 65 years old. She denies fevers, chills, fatigue, recent infections, weight loss, and night sweats. She has not had any headaches, visual changes, dizziness, and falls. No chest pain, heart palpitations, cough and shortness of breath reported. No reports of GI problems such as nausea, vomiting, diarrhea, and constipation. She has no reports of blood in stools, dysuria and hematuria. No depression or anxiety, and denies suicidal ideations, homicidal ideations, or auditory hallucinations. She denies pain today.   Past Surgical History:  Procedure Laterality Date  . axilla abscess    . COLOSTOMY      Family History  Problem Relation Age of Onset  . Diabetes Mother   . Hypertension Mother   . Diabetes Maternal Grandmother   . Diabetes Father   . Anesthesia problems Neg Hx     Social History   Socioeconomic History  . Marital status: Divorced    Spouse name: Not on file  . Number of children: 2  . Years of education: Not on file  . Highest education level: Some college, no degree  Occupational History  . Not on file  Social Needs  . Financial resource strain: Not on file  . Food insecurity    Worry: Not on file    Inability: Not on file  . Transportation needs    Medical: No   Non-medical: No  Tobacco Use  . Smoking status: Never Smoker  . Smokeless tobacco: Never Used  Substance and Sexual Activity  . Alcohol use: No  . Drug use: No  . Sexual activity: Yes    Partners: Male    Birth control/protection: Pill  Lifestyle  . Physical activity    Days per week: Not on file    Minutes per session: Not on file  . Stress: Not on file  Relationships  . Social Herbalist on phone: Not on file    Gets together: Not on file    Attends religious service: Not on file    Active member of club or organization: Not on file    Attends meetings of clubs or organizations: Not on file    Relationship status: Not on file  . Intimate partner violence    Fear of current or ex partner: Not on file    Emotionally abused: Not on file    Physically abused: Not on file    Forced sexual activity: Not on file  Other Topics Concern  . Not on file  Social History Narrative  . Not on file    Outpatient Medications Prior to Visit  Medication Sig Dispense Refill  . Biotin 1000 MCG CHEW Chew by mouth.    Marland Kitchen ibuprofen (ADVIL,MOTRIN) 200 MG tablet Take 200 mg by  mouth every 6 (six) hours as needed.    . ergocalciferol (DRISDOL) 50000 units capsule Take 1 capsule (50,000 Units total) by mouth once a week. (Patient not taking: Reported on 05/25/2018) 30 capsule 1  . MONO-LINYAH 0.25-35 MG-MCG tablet TAKE 1 TABLET BY MOUTH DAILY. (Patient not taking: Reported on 12/29/2018) 28 tablet 11  . Omega-3 Fatty Acids (FISH OIL) 1000 MG CAPS Take by mouth.     No facility-administered medications prior to visit.     Allergies  Allergen Reactions  . Ferrous Sulfate Swelling  . Shellfish Allergy Swelling    ROS Review of Systems  Constitutional: Negative.   HENT: Negative.   Eyes: Negative.   Respiratory: Negative.   Cardiovascular: Negative.   Gastrointestinal: Negative.   Endocrine: Negative.   Genitourinary: Negative.   Musculoskeletal: Negative.   Skin: Negative.    Allergic/Immunologic: Negative.   Neurological: Negative.   Hematological: Negative.   Psychiatric/Behavioral: Negative.    Objective:    Physical Exam  Constitutional: She is oriented to person, place, and time. She appears well-developed and well-nourished.  HENT:  Head: Normocephalic and atraumatic.  Eyes: Conjunctivae are normal.  Neck: Normal range of motion. Neck supple.  Cardiovascular: Normal rate, regular rhythm, normal heart sounds and intact distal pulses.  Pulmonary/Chest: Effort normal and breath sounds normal.  Abdominal: Soft. Bowel sounds are normal.  Musculoskeletal: Normal range of motion.  Neurological: She is alert and oriented to person, place, and time. She has normal reflexes.  Skin: Skin is warm and dry.  Psychiatric: She has a normal mood and affect. Her behavior is normal. Judgment and thought content normal.  Nursing note and vitals reviewed.   BP 122/76 (BP Location: Left Arm, Patient Position: Sitting, Cuff Size: Normal)   Pulse 74   Temp 98.5 F (36.9 C) (Oral)   Ht 5\' 6"  (1.676 m)   Wt 199 lb 6.4 oz (90.4 kg)   BMI 32.18 kg/m  Wt Readings from Last 3 Encounters:  12/29/18 199 lb 6.4 oz (90.4 kg)  10/21/18 203 lb (92.1 kg)  10/01/18 200 lb 3.2 oz (90.8 kg)     Health Maintenance Due  Topic Date Due  . PAP SMEAR-Modifier  01/16/2017    There are no preventive care reminders to display for this patient.  Lab Results  Component Value Date   TSH 0.507 12/29/2018   Lab Results  Component Value Date   WBC 7.1 12/29/2018   HGB 11.8 12/29/2018   HCT 37.2 12/29/2018   MCV 79 12/29/2018   PLT 395 12/29/2018   Lab Results  Component Value Date   NA 136 12/29/2018   K 4.0 12/29/2018   CO2 22 12/29/2018   GLUCOSE 81 12/29/2018   BUN 8 12/29/2018   CREATININE 0.78 12/29/2018   BILITOT 0.5 12/29/2018   ALKPHOS 58 12/29/2018   AST 14 12/29/2018   ALT 19 12/29/2018   PROT 7.5 12/29/2018   ALBUMIN 3.9 12/29/2018   CALCIUM 9.4  12/29/2018   Lab Results  Component Value Date   CHOL 120 12/29/2018   Lab Results  Component Value Date   HDL 83 12/29/2018   No results found for: [facility] Lab Results  Component Value Date   TRIG 56 12/29/2018   Lab Results  Component Value Date   CHOLHDL 1.4 12/29/2018   Lab Results  Component Value Date   HGBA1C 5.3 08/03/2017      Assessment & Plan:   1. Possible pregnancy - POCT urine pregnancy  2. Positive urine pregnancy test - Ambulatory referral to Obstetrics / Gynecology - hCG, quantitative, pregnancy - Beta hCG quant (ref lab)  3. Healthcare maintenance - CBC with Differential - Comprehensive metabolic panel - Lipid Panel - TSH - Vitamin B12 - Vitamin D, 25-hydroxy  4. Follow up She will follow up in 3 months.   No orders of the defined types were placed in this encounter.   Orders Placed This Encounter  Procedures  . CBC with Differential  . Comprehensive metabolic panel  . Lipid Panel  . TSH  . Vitamin B12  . Vitamin D, 25-hydroxy  . hCG, quantitative, pregnancy  . Beta hCG quant (ref lab)  . Ambulatory referral to Obstetrics / Gynecology  . POCT urine pregnancy     Referral Orders     Ambulatory referral to Obstetrics / Gynecology   Raliegh IpNatalie Ziggy Reveles,  MSN, FNP-BC [facility] [address]  [place], [place] [id number] [phone]   Problem List Items Addressed This Visit    None    Visit Diagnoses    Possible pregnancy    -  Primary   Relevant Orders   POCT urine pregnancy (Completed)   Positive urine pregnancy test       Relevant Orders   Ambulatory referral to Obstetrics / Gynecology   hCG, quantitative, pregnancy   Beta hCG quant (ref lab) (Completed)   Healthcare maintenance       Relevant Orders   CBC with Differential (Completed)   Comprehensive metabolic panel (Completed)   Lipid Panel (Completed)   TSH (Completed)    Vitamin B12 (Completed)   Vitamin D, 25-hydroxy (Completed)   Follow up          No orders of the defined types were placed in this encounter.   Follow-up: Return in about 3 months (around 03/30/2019).    Kallie LocksNatalie M Nishika Parkhurst, FNP

## 2018-12-30 LAB — CBC WITH DIFFERENTIAL/PLATELET
Basophils Absolute: 0 10*3/uL (ref 0.0–0.2)
Basos: 0 %
EOS (ABSOLUTE): 0 10*3/uL (ref 0.0–0.4)
Eos: 1 %
Hematocrit: 37.2 % (ref 34.0–46.6)
Hemoglobin: 11.8 g/dL (ref 11.1–15.9)
Immature Grans (Abs): 0 10*3/uL (ref 0.0–0.1)
Immature Granulocytes: 0 %
Lymphocytes Absolute: 1.5 10*3/uL (ref 0.7–3.1)
Lymphs: 21 %
MCH: 25.2 pg — ABNORMAL LOW (ref 26.6–33.0)
MCHC: 31.7 g/dL (ref 31.5–35.7)
MCV: 79 fL (ref 79–97)
Monocytes Absolute: 0.6 10*3/uL (ref 0.1–0.9)
Monocytes: 8 %
Neutrophils Absolute: 5 10*3/uL (ref 1.4–7.0)
Neutrophils: 70 %
Platelets: 395 10*3/uL (ref 150–450)
RBC: 4.69 x10E6/uL (ref 3.77–5.28)
RDW: 14.2 % (ref 11.7–15.4)
WBC: 7.1 10*3/uL (ref 3.4–10.8)

## 2018-12-30 LAB — COMPREHENSIVE METABOLIC PANEL
ALT: 19 IU/L (ref 0–32)
AST: 14 IU/L (ref 0–40)
Albumin/Globulin Ratio: 1.1 — ABNORMAL LOW (ref 1.2–2.2)
Albumin: 3.9 g/dL (ref 3.8–4.8)
Alkaline Phosphatase: 58 IU/L (ref 39–117)
BUN/Creatinine Ratio: 10 (ref 9–23)
BUN: 8 mg/dL (ref 6–20)
Bilirubin Total: 0.5 mg/dL (ref 0.0–1.2)
CO2: 22 mmol/L (ref 20–29)
Calcium: 9.4 mg/dL (ref 8.7–10.2)
Chloride: 101 mmol/L (ref 96–106)
Creatinine, Ser: 0.78 mg/dL (ref 0.57–1.00)
GFR calc Af Amer: 115 mL/min/{1.73_m2} (ref >59)
GFR calc non Af Amer: 99 mL/min/{1.73_m2} (ref >59)
Globulin, Total: 3.6 g/dL (ref 1.5–4.5)
Glucose: 81 mg/dL (ref 65–99)
Potassium: 4 mmol/L (ref 3.5–5.2)
Sodium: 136 mmol/L (ref 134–144)
Total Protein: 7.5 g/dL (ref 6.0–8.5)

## 2018-12-30 LAB — VITAMIN D 25 HYDROXY (VIT D DEFICIENCY, FRACTURES): Vit D, 25-Hydroxy: 33.2 ng/mL (ref 30.0–100.0)

## 2018-12-30 LAB — LIPID PANEL
Chol/HDL Ratio: 1.4 ratio (ref 0.0–4.4)
Cholesterol, Total: 120 mg/dL (ref 100–199)
HDL: 83 mg/dL (ref >39)
LDL Chol Calc (NIH): 24 mg/dL (ref 0–99)
Triglycerides: 56 mg/dL (ref 0–149)
VLDL Cholesterol Cal: 13 mg/dL (ref 5–40)

## 2018-12-30 LAB — VITAMIN B12: Vitamin B-12: 143 pg/mL — ABNORMAL LOW (ref 232–1245)

## 2018-12-30 LAB — BETA HCG QUANT (REF LAB): hCG Quant: 37488 m[IU]/mL

## 2018-12-30 LAB — TSH: TSH: 0.507 u[IU]/mL (ref 0.450–4.500)

## 2019-01-10 ENCOUNTER — Inpatient Hospital Stay (HOSPITAL_COMMUNITY): Payer: Medicaid Other

## 2019-01-10 ENCOUNTER — Encounter (HOSPITAL_COMMUNITY): Payer: Self-pay | Admitting: *Deleted

## 2019-01-10 ENCOUNTER — Other Ambulatory Visit: Payer: Self-pay

## 2019-01-10 ENCOUNTER — Inpatient Hospital Stay (HOSPITAL_COMMUNITY)
Admission: AD | Admit: 2019-01-10 | Discharge: 2019-01-10 | Disposition: A | Discharge status: Home / Self Care | Payer: Medicaid Other | Attending: Obstetrics and Gynecology | Admitting: Obstetrics and Gynecology

## 2019-01-10 DIAGNOSIS — R109 Unspecified abdominal pain: Secondary | ICD-10-CM | POA: Diagnosis not present

## 2019-01-10 DIAGNOSIS — Z3A13 13 weeks gestation of pregnancy: Secondary | ICD-10-CM | POA: Diagnosis not present

## 2019-01-10 DIAGNOSIS — O99211 Obesity complicating pregnancy, first trimester: Secondary | ICD-10-CM | POA: Insufficient documentation

## 2019-01-10 DIAGNOSIS — IMO0002 Reserved for concepts with insufficient information to code with codable children: Secondary | ICD-10-CM

## 2019-01-10 DIAGNOSIS — R1032 Left lower quadrant pain: Secondary | ICD-10-CM | POA: Diagnosis not present

## 2019-01-10 DIAGNOSIS — O26891 Other specified pregnancy related conditions, first trimester: Secondary | ICD-10-CM | POA: Diagnosis not present

## 2019-01-10 DIAGNOSIS — E669 Obesity, unspecified: Secondary | ICD-10-CM | POA: Insufficient documentation

## 2019-01-10 DIAGNOSIS — O26899 Other specified pregnancy related conditions, unspecified trimester: Secondary | ICD-10-CM | POA: Diagnosis present

## 2019-01-10 LAB — URINALYSIS, ROUTINE W REFLEX MICROSCOPIC
Bilirubin Urine: NEGATIVE
Glucose, UA: NEGATIVE mg/dL
Hgb urine dipstick: NEGATIVE
Ketones, ur: NEGATIVE mg/dL
Leukocytes,Ua: NEGATIVE
Nitrite: NEGATIVE
Protein, ur: NEGATIVE mg/dL
Specific Gravity, Urine: 1.024 (ref 1.005–1.030)
pH: 6 (ref 5.0–8.0)

## 2019-01-10 NOTE — Discharge Instructions (Signed)
Return to MAU:  If you have heavier bleeding that soaks through more that 2 pads per hour for an hour or more  If you bleed so much that you feel like you might pass out or you do pass out  If you have significant abdominal pain that is not improved with Tylenol   If you develop a fever > 100.5   Safe Medications in Pregnancy   Acne: Benzoyl Peroxide Salicylic Acid  Backache/Headache: Tylenol: 2 regular strength every 4 hours OR              2 Extra strength every 6 hours  Colds/Coughs/Allergies: Benadryl (alcohol free) 25 mg every 6 hours as needed Breath right strips Claritin Cepacol throat lozenges Chloraseptic throat spray Cold-Eeze- up to three times per day Cough drops, alcohol free Flonase (by prescription only) Guaifenesin Mucinex Robitussin DM (plain only, alcohol free) Saline nasal spray/drops Sudafed (pseudoephedrine) & Actifed ** use only after [redacted] weeks gestation and if you do not have high blood pressure Tylenol Vicks Vaporub Zinc lozenges Zyrtec   Constipation: Colace Ducolax suppositories Fleet enema Glycerin suppositories Metamucil Milk of magnesia Miralax Senokot Smooth move tea  Diarrhea: Kaopectate Imodium A-D  *NO pepto Bismol  Hemorrhoids: Anusol Anusol HC Preparation H Tucks  Indigestion: Tums Maalox Mylanta Zantac  Pepcid  Insomnia: Benadryl (alcohol free) 25mg  every 6 hours as needed Tylenol PM Unisom, no Gelcaps  Leg Cramps: Tums MagGel  Nausea/Vomiting:  Bonine Dramamine Emetrol Ginger extract Sea bands Meclizine  Nausea medication to take during pregnancy:  Unisom (doxylamine succinate 25 mg tablets) Take one tablet daily at bedtime. If symptoms are not adequately controlled, the dose can be increased to a maximum recommended dose of two tablets daily (1/2 tablet in the morning, 1/2 tablet mid-afternoon and one at bedtime). Vitamin B6 100mg  tablets. Take one tablet twice a day (up to 200 mg per  day).  Skin Rashes: Aveeno products Benadryl cream or 25mg  every 6 hours as needed Calamine Lotion 1% cortisone cream  Yeast infection: Gyne-lotrimin 7 Monistat 7   **If taking multiple medications, please check labels to avoid duplicating the same active ingredients **take medication as directed on the label ** Do not exceed 4000 mg of tylenol in 24 hours **Do not take medications that contain aspirin or ibuprofen       [facility] for [place] for [place] @ [facility]   Phone: [place] for [place] @ [place]   Phone: [place] @[place]       Phone: [place] for [place] @ [place]     Phone: [place] for [place] @ [employer]   Phone: [place] for [place] @ [facility]  Phone: [place] for [place] @ [employer])  Phone: [phone]

## 2019-01-10 NOTE — MAU Note (Addendum)
Patient presents to MAU c/o intermittent pulling, pressure and cramping in LLQ. Patient states this has been going on for about 2 weeks.  Patient denies vaginal bleeding and normal discharge.  Patient reports last intercourse 2 weeks ago. Positive preg test 9/16.

## 2019-01-10 NOTE — MAU Provider Note (Addendum)
Chief Complaint: Abdominal Pain   First Provider Initiated Contact with Patient 01/10/19 2025     SUBJECTIVE HPI: Barbara Waller is a 33 y.o. G3P2002 at [redacted]w[redacted]d by LMP who presents to Maternity Admissions reporting abdominal pain. Reports intermittent lower abdominal pain for the last 2 weeks. Pain is worse with movement. Has not started prenatal care yet but did have an HCG at her PCPs office on 9/16 of 37k. Denies n/v/d, constipation, dysuria, vaginal bleeding, or vaginal discharge.    Location: abdomen Quality: cramping, pulling Severity: 7/10 on pain scale Duration: 2 weeks Timing: intermittent Modifying factors: worse with movement Associated signs and symptoms: none  Past Medical History:  Diagnosis Date  . 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 Obesity   . Vaginal Pap smear, abnormal    OB History  Gravida Para Term Preterm AB Living  3 2 2     2   SAB TAB Ectopic Multiple Live Births        0 2    # Outcome Date GA Lbr Len/2nd Weight Sex Delivery Anes PTL Lv  3 Current           2 Term 08/31/14 [redacted]w[redacted]d 11:33 / 00:29 3487 g M Vag-Spont EPI  LIV  1 Term 02/16/09 [redacted]w[redacted]d  3118 g F    LIV   Past Surgical History:  Procedure Laterality Date  . axilla abscess    . COLOSTOMY     Social History   Socioeconomic History  . Marital status: Divorced    Spouse name: Not on file  . Number of children: 2  . Years of education: Not on file  . Highest education level: Some college, no degree  Occupational History  . Not on file  Social Needs  . Financial resource strain: Not on file  . Food insecurity    Worry: Not on file    Inability: Not on file  . Transportation needs    Medical: No    Non-medical: No  Tobacco Use  . Smoking status: Never Smoker  . Smokeless tobacco: Never Used  Substance and Sexual Activity  . Alcohol use: No  . Drug use: No  . Sexual activity: Yes    Partners: Male    Birth  control/protection: Pill  Lifestyle  . Physical activity    Days per week: Not on file    Minutes per session: Not on file  . Stress: Not on file  Relationships  . Social [redacted]w[redacted]d on phone: Not on file    Gets together: Not on file    Attends religious service: Not on file    Active member of club or organization: Not on file    Attends meetings of clubs or organizations: Not on file    Relationship status: Not on file  . Intimate partner violence    Fear of current or ex partner: Not on file    Emotionally abused: Not on file    Physically abused: Not on file    Forced sexual activity: Not on file  Other Topics Concern  . Not on file  Social History Narrative  . Not on file   Family History  Problem Relation Age of Onset  . Diabetes Mother   . Hypertension Mother   . Diabetes Maternal Grandmother   . Diabetes Father   . Anesthesia problems Neg Hx    No current facility-administered medications on file prior to  encounter.    Current Outpatient Medications on File Prior to Encounter  Medication Sig Dispense Refill  . Prenatal Vit-Fe Fumarate-FA (PRENATAL MULTIVITAMIN) TABS tablet Take 1 tablet by mouth daily at 12 noon.    . Biotin 1000 MCG CHEW Chew by mouth.    . ergocalciferol (DRISDOL) 50000 units capsule Take 1 capsule (50,000 Units total) by mouth once a week. (Patient not taking: Reported on 05/25/2018) 30 capsule 1  . ibuprofen (ADVIL,MOTRIN) 200 MG tablet Take 200 mg by mouth every 6 (six) hours as needed.    Marland Kitchen MONO-LINYAH 0.25-35 MG-MCG tablet TAKE 1 TABLET BY MOUTH DAILY. (Patient not taking: Reported on 12/29/2018) 28 tablet 11  . Omega-3 Fatty Acids (FISH OIL) 1000 MG CAPS Take by mouth.     Allergies  Allergen Reactions  . Ferrous Sulfate Swelling  . Shellfish Allergy Swelling    I have reviewed patient's Past Medical Hx, Surgical Hx, Family Hx, Social Hx, medications and allergies.   Review of Systems  Constitutional: Negative.    Gastrointestinal: Positive for abdominal pain.  Genitourinary: Negative.     OBJECTIVE Patient Vitals for the past 24 hrs:  BP Temp Temp src Pulse Resp SpO2 Height Weight  01/10/19 2255 124/74 99 F (37.2 C) Oral 96 16 - - -  01/10/19 1957 107/70 99 F (37.2 C) Oral 83 16 - - -  01/10/19 1943 112/71 - - 86 17 100 % 5\' 6"  (1.676 m) 91.7 kg   Constitutional: Well-developed, well-nourished female in no acute distress.  Cardiovascular: normal rate & rhythm, no murmur Respiratory: normal rate and effort. Lung sounds clear throughout GI: Abd soft, non-tender, Pos BS x 4. No guarding or rebound tenderness MS: Extremities nontender, no edema, normal ROM Neurologic: Alert and oriented x 4.      LAB RESULTS Results for orders placed or performed during the hospital encounter of 01/10/19 (from the past 24 hour(s))  Urinalysis, Routine w reflex microscopic     Status: None   Collection Time: 01/10/19  8:55 PM  Result Value Ref Range   Color, Urine YELLOW YELLOW   APPearance CLEAR CLEAR   Specific Gravity, Urine 1.024 1.005 - 1.030   pH 6.0 5.0 - 8.0   Glucose, UA NEGATIVE NEGATIVE mg/dL   Hgb urine dipstick NEGATIVE NEGATIVE   Bilirubin Urine NEGATIVE NEGATIVE   Ketones, ur NEGATIVE NEGATIVE mg/dL   Protein, ur NEGATIVE NEGATIVE mg/dL   Nitrite NEGATIVE NEGATIVE   Leukocytes,Ua NEGATIVE NEGATIVE    IMAGING US Ob Less Than 14 Weeks With Ob Transvaginal  Result Date: 01/10/2019 CLINICAL DATA:  Left lower quadrant cramping EXAM: OBSTETRIC <14 WK [place] AND TRANSVAGINAL OB US TECHNIQUE: Both transabdominal and transvaginal ultrasound examinations were performed for complete evaluation of the gestation as well as the maternal uterus, adnexal regions, and pelvic cul-de-sac. Transvaginal technique was performed to assess early pregnancy. COMPARISON:  None. FINDINGS: Intrauterine gestational sac: Single Yolk sac:  Visualized. Embryo:  Visualized. Cardiac Activity: Visualized. Heart Rate: 158 bpm  CRL: 16.8 mm   8 w   0 d                  [place] EDC: 08/22/2019 Subchorionic hemorrhage:  None visualized. Maternal uterus/adnexae: Bilateral ovaries are within normal limits. Right ovary measures 3.3 x 1.9 x 2.1 cm. Left ovary measures 2.8 x 1.9 x 1.8 cm. No significant free fluid. IMPRESSION: Single viable intrauterine pregnancy as above. No specific abnormality is seen Electronically Signed   By: Donavan Foil  M.D.   On: 01/10/2019 21:20    MAU COURSE Orders Placed This Encounter  Procedures  . US OB LESS THAN 14 WEEKS WITH OB TRANSVAGINAL  . Urinalysis, Routine w reflex microscopic  . Discharge patient   No orders of the defined types were placed in this encounter.   MDM Pt should be 13 wks by fairly certain LMP. Had HCG of >30k on 9/16. RN unable to doppler FHTs.  Ultrasound ordered  [facility] Laurich CNM Judeth HornLawrence, Erin, NP 01/10/2019  9:09 PM  A/P: Abdominal pain affecting pregnancy in first trimester - Advised to take Tylenol 1000 mg po prn pain - [facility] given - Scheduled PNC in 2-3 wks - Safe meds in preg list given - Encouraged to return here for OB emergencies  - Seek care at Urgent Care/ED if non-OB related concerning symptoms.   Raelyn MoraRolitta Novella Abraha, CNM  01/10/2019 11:01 PM

## 2019-01-26 ENCOUNTER — Other Ambulatory Visit (HOSPITAL_COMMUNITY)
Admission: RE | Admit: 2019-01-26 | Discharge: 2019-01-26 | Disposition: A | Discharge status: Home / Self Care | Payer: Medicaid Other | Source: Ambulatory Visit | Attending: Advanced Practice Midwife | Admitting: Advanced Practice Midwife

## 2019-01-26 ENCOUNTER — Other Ambulatory Visit: Payer: Self-pay

## 2019-01-26 ENCOUNTER — Ambulatory Visit (INDEPENDENT_AMBULATORY_CARE_PROVIDER_SITE_OTHER): Payer: Medicaid Other | Admitting: Advanced Practice Midwife

## 2019-01-26 ENCOUNTER — Encounter: Payer: Self-pay | Admitting: Advanced Practice Midwife

## 2019-01-26 VITALS — BP 116/78 | HR 94 | Wt 198.4 lb

## 2019-01-26 DIAGNOSIS — Z3401 Encounter for supervision of normal first pregnancy, first trimester: Secondary | ICD-10-CM | POA: Diagnosis not present

## 2019-01-26 DIAGNOSIS — E669 Obesity, unspecified: Secondary | ICD-10-CM

## 2019-01-26 DIAGNOSIS — Z3481 Encounter for supervision of other normal pregnancy, first trimester: Secondary | ICD-10-CM

## 2019-01-26 DIAGNOSIS — Z3A10 10 weeks gestation of pregnancy: Secondary | ICD-10-CM

## 2019-01-26 DIAGNOSIS — Z8742 Personal history of other diseases of the female genital tract: Secondary | ICD-10-CM

## 2019-01-26 MED ORDER — BLOOD PRESSURE MONITORING DEVI: 0 refills | Status: DC

## 2019-01-26 NOTE — Progress Notes (Signed)
Decline flu  Pap smear today  Order place for babyscripts

## 2019-01-26 NOTE — Progress Notes (Signed)
History:   Barbara Waller is a 33 y.o. G3P2002 at [redacted]w[redacted]d by early ultrasound being seen today for her first obstetrical visit.  Her obstetrical history is significant for obesity and low-risk term SVD x 2. Patient does intend to breast feed. She previously breastfed for 2 months then experienced loss of supply and began supplementing. Pregnancy history fully reviewed.  Patient reports nausea and vomiting. She endorses new onset sensitivity to smells. She is managing with trigger avoidance and declines medication today.  Patient is planning to move to [place] with her FOB during this pregnancy. She is unsure when but states "probably around the first of the year.   Patient was evaluated in MAU for abdominal pain on 01/10/19. She denies additional episodes of abdominal pain and states staying hydrated has been a great solution for her.     HISTORY: OB History  Gravida Para Term Preterm AB Living  3 2 2  0 0 2  SAB TAB Ectopic Multiple Live Births  0 0 0 0 2    # Outcome Date GA Lbr Len/2nd Weight Sex Delivery Anes PTL Lv  3 Current           2 Term 08/31/14 [redacted]w[redacted]d 11:33 / 00:29 3.487 kg M Vag-Spont EPI  LIV     Apgar1: 8  Apgar5: 9  1 Term 02/16/09 [redacted]w[redacted]d  3.118 kg F    LIV    Last pap smear was done 2015 and was normal  Past Medical History:  Diagnosis Date  . Anemia 08/04/2017  . BMI 32.0-32.9,adult 06/26/2015  . Breast cyst, left 10/02/2018  . Breast lump on left side at 6 o'clock position 10/21/2018  . Breast pain, left 10/02/2018  . Chronic midline low back pain without sciatica 05/16/2016  . Flu-like symptoms 05/25/2018  . Influenza B 05/25/2018  . Left breast abscess 06/26/2015  . 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 Obesity   . Sore throat 05/25/2018  . Vaginal Pap smear, abnormal   . Vitamin D deficiency 08/04/2017   Past Surgical History:  Procedure Laterality Date  . axilla abscess    . COLOSTOMY      Family History  Problem Relation Age of Onset  . Diabetes Mother   . Hypertension Mother   . Diabetes Maternal Grandmother   . Diabetes Father   . Anesthesia problems Neg Hx    Social History   Tobacco Use  . Smoking status: Never Smoker  . Smokeless tobacco: Never Used  Substance Use Topics  . Alcohol use: No  . Drug use: No   Allergies  Allergen Reactions  . Ferrous Sulfate Swelling  . Shellfish Allergy Swelling   Current Outpatient Medications on File Prior to Visit  Medication Sig Dispense Refill  . Biotin 1000 MCG CHEW Chew by mouth.    . Prenatal Vit-Fe Fumarate-FA (PRENATAL MULTIVITAMIN) TABS tablet Take 1 tablet by mouth daily at 12 noon.    . ergocalciferol (DRISDOL) 50000 units capsule Take 1 capsule (50,000 Units total) by mouth once a week. (Patient not taking: Reported on 01/26/2019) 30 capsule 1  . Omega-3 Fatty Acids (FISH OIL) 1000 MG CAPS Take by mouth.     No current facility-administered medications on file prior to visit.     Review of Systems Pertinent items noted in HPI and remainder of comprehensive ROS otherwise negative. Physical Exam:   Vitals:   01/26/19 0943  BP: 116/78  Pulse: 94  Weight: 90 kg   Fetal Heart Rate (bpm): 160 Uterus:     Pelvic Exam: Perineum: no hemorrhoids, normal perineum   Vulva: normal external genitalia, no lesions   Vagina:  normal mucosa, normal discharge   Cervix: no lesions and normal, pap smear done.    Adnexa: normal adnexa and no mass, fullness, tenderness   Bony Pelvis: average  System: General: well-developed, well-nourished female in no acute distress   Breasts:  normal appearance, no masses or tenderness bilaterally   Skin: normal coloration and turgor, no rashes   Neurologic: oriented, normal, negative, normal mood   Extremities: normal strength, tone, and muscle mass, ROM of all joints is normal   HEENT PERRLA, extraocular movement intact and sclera clear, anicteric   Mouth/Teeth mucous membranes  moist, pharynx normal without lesions and dental hygiene good   Neck supple and no masses   Cardiovascular: regular rate and rhythm   Respiratory:  no respiratory distress, normal breath sounds   Abdomen: soft, non-tender; bowel sounds normal; no masses,  no organomegaly   Bedside Ultrasound for FHR check: 160 bpm Patient informed that the ultrasound is considered a limited obstetric ultrasound and is not intended to be a complete ultrasound exam.  Patient also informed that the ultrasound is not being completed with the intent of assessing for fetal or placental anomalies or any pelvic abnormalities.  Explained that the purpose of today's ultrasound is to assess for fetal heart rate.  Patient acknowledges the purpose of the exam and the limitations of the study.     Assessment:    Pregnancy: [id number] Patient Active Problem List   Diagnosis Date Noted  . Abdominal pain affecting pregnancy 01/10/2019  . Screening breast examination 10/21/2018  . 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 Prediabetes 01/03/2016  . Supervision of other normal pregnancy, antepartum 01/16/2014      Indications for ASA therapy (per uptodate) One of the following: Previous pregnancy with preeclampsia, especially early onset and with an adverse outcome No Multifetal gestation No Chronic hypertension No Type 1 or 2 diabetes mellitus No Chronic kidney disease No Autoimmune disease (antiphospholipid syndrome, systemic lupus erythematosus) No  Two or more of the following: Nulliparity No Obesity (body mass index >30 kg/m2) Yes Family history of preeclampsia in mother or sister No Age ?35 years No Sociodemographic characteristics (African American race, low socioeconomic level) Yes Personal risk factors (eg, previous pregnancy with low birth weight or small for gestational age infant, previous adverse pregnancy outcome [eg, stillbirth], interval >10 years between pregnancies) No  Plan:    1. Encounter for supervision  of other normal pregnancy in first trimester - Patient advised to start daily ASA 81 mg between 12 and 16 weeks. Patient declines prescription but will consider - Cytology - PAP( [place]) - Babyscripts Schedule Optimization - Cervicovaginal ancillary only( [place]) - Protein / creatinine ratio, urine - Comprehensive metabolic panel; Future - Hemoglobin A1c; Future - Urine Culture  2. Obesity (BMI 30-39.9) - 11-20 lb total gain advised  3. History of abnormal pap - Hx LSIL - Normal pap 01/2014  Initial labs deferred due to GA Continue prenatal vitamins. Genetic Screening discussed, First trimester screen, Quad screen and NIPS: ordered. Ultrasound discussed; fetal anatomic survey: ordered. Problem list reviewed and updated. The nature of [facility] with multiple MDs and other Advanced Practice Providers was explained to patient; also emphasized that residents, students are part of our team. Routine obstetric precautions reviewed. Return in about 3  WEEKS FOR LABS, APPT IN 9 WEEKS .    Clayton Bibles, MSN, CNM Certified Nurse Midwife, [employer] for [employer], [facility] 01/26/19 3:21 PM

## 2019-01-27 LAB — PROTEIN / CREATININE RATIO, URINE
Creatinine, Urine: 215 mg/dL
Protein, Ur: 14 mg/dL
Protein/Creat Ratio: 65 mg/g creat (ref 0–200)

## 2019-01-27 LAB — URINE CULTURE

## 2019-01-31 LAB — CERVICOVAGINAL ANCILLARY ONLY
Bacterial Vaginitis (gardnerella): NEGATIVE
Candida Glabrata: NEGATIVE
Candida Vaginitis: POSITIVE — AB
Chlamydia: NEGATIVE
Comment: NEGATIVE
Comment: NEGATIVE
Comment: NEGATIVE
Comment: NEGATIVE
Comment: NEGATIVE
Comment: NORMAL
Neisseria Gonorrhea: NEGATIVE
Trichomonas: NEGATIVE

## 2019-02-01 LAB — CYTOLOGY - PAP
Comment: NEGATIVE
Diagnosis: NEGATIVE
High risk HPV: NEGATIVE

## 2019-02-02 ENCOUNTER — Other Ambulatory Visit: Payer: Self-pay | Admitting: Advanced Practice Midwife

## 2019-02-02 DIAGNOSIS — B3731 Acute candidiasis of vulva and vagina: Secondary | ICD-10-CM

## 2019-02-02 DIAGNOSIS — B373 Candidiasis of vulva and vagina: Secondary | ICD-10-CM

## 2019-02-02 MED ORDER — TERCONAZOLE 0.4 % VA CREA: 1.0000 | TOPICAL_CREAM | Freq: Every day | VAGINAL | 0 refills | Status: DC

## 2019-02-02 MED FILL — TERCONAZOLE 0.4% VAG CREAM: 0.4 | 7 days supply | Qty: 45 | Fill #0

## 2019-02-02 NOTE — Progress Notes (Signed)
+   yeast at [facility]. Rx to pharmacy, notified via [place], MSN, CNM Certified Nurse Midwife, [facility] for [employer], [place] [facility] 02/02/19 10:37 AM

## 2019-02-03 DIAGNOSIS — Z3400 Encounter for supervision of normal first pregnancy, unspecified trimester: Secondary | ICD-10-CM | POA: Diagnosis not present

## 2019-02-03 DIAGNOSIS — Z3490 Encounter for supervision of normal pregnancy, unspecified, unspecified trimester: Secondary | ICD-10-CM | POA: Diagnosis not present

## 2019-02-16 ENCOUNTER — Other Ambulatory Visit: Payer: Self-pay

## 2019-02-16 ENCOUNTER — Other Ambulatory Visit: Payer: No Typology Code available for payment source

## 2019-02-16 DIAGNOSIS — Z3481 Encounter for supervision of other normal pregnancy, first trimester: Secondary | ICD-10-CM

## 2019-02-17 LAB — COMPREHENSIVE METABOLIC PANEL
ALT: 28 IU/L (ref 0–32)
AST: 21 IU/L (ref 0–40)
Albumin/Globulin Ratio: 1.2 (ref 1.2–2.2)
Albumin: 3.8 g/dL (ref 3.8–4.8)
Alkaline Phosphatase: 67 IU/L (ref 39–117)
BUN/Creatinine Ratio: 10 (ref 9–23)
BUN: 7 mg/dL (ref 6–20)
Bilirubin Total: 0.3 mg/dL (ref 0.0–1.2)
CO2: 21 mmol/L (ref 20–29)
Calcium: 8.9 mg/dL (ref 8.7–10.2)
Chloride: 103 mmol/L (ref 96–106)
Creatinine, Ser: 0.7 mg/dL (ref 0.57–1.00)
GFR calc Af Amer: 131 mL/min/{1.73_m2} (ref >59)
GFR calc non Af Amer: 113 mL/min/{1.73_m2} (ref >59)
Globulin, Total: 3.3 g/dL (ref 1.5–4.5)
Glucose: 87 mg/dL (ref 65–99)
Potassium: 3.4 mmol/L — ABNORMAL LOW (ref 3.5–5.2)
Sodium: 138 mmol/L (ref 134–144)
Total Protein: 7.1 g/dL (ref 6.0–8.5)

## 2019-02-17 LAB — GLUCOSE TOLERANCE, 1 HOUR: Glucose, 1Hr PP: 87 mg/dL (ref 65–199)

## 2019-02-17 LAB — OBSTETRIC PANEL, INCLUDING HIV
Antibody Screen: NEGATIVE
Basophils Absolute: 0 10*3/uL (ref 0.0–0.2)
Basos: 1 %
EOS (ABSOLUTE): 0 10*3/uL (ref 0.0–0.4)
Eos: 1 %
HIV Screen 4th Generation wRfx: NONREACTIVE
Hematocrit: 34.8 % (ref 34.0–46.6)
Hemoglobin: 11.3 g/dL (ref 11.1–15.9)
Hepatitis B Surface Ag: NEGATIVE
Immature Grans (Abs): 0 10*3/uL (ref 0.0–0.1)
Immature Granulocytes: 0 %
Lymphocytes Absolute: 1.6 10*3/uL (ref 0.7–3.1)
Lymphs: 42 %
MCH: 25.5 pg — ABNORMAL LOW (ref 26.6–33.0)
MCHC: 32.5 g/dL (ref 31.5–35.7)
MCV: 78 fL — ABNORMAL LOW (ref 79–97)
Monocytes Absolute: 0.4 10*3/uL (ref 0.1–0.9)
Monocytes: 10 %
Neutrophils Absolute: 1.7 10*3/uL (ref 1.4–7.0)
Neutrophils: 46 %
Platelets: 308 10*3/uL (ref 150–450)
RBC: 4.44 x10E6/uL (ref 3.77–5.28)
RDW: 13.5 % (ref 11.7–15.4)
RPR Ser Ql: NONREACTIVE
Rh Factor: POSITIVE
Rubella Antibodies, IGG: 1.27 index (ref >0.99)
WBC: 3.8 10*3/uL (ref 3.4–10.8)

## 2019-02-17 LAB — HEMOGLOBIN A1C
Est. average glucose Bld gHb Est-mCnc: 117 mg/dL
Hgb A1c MFr Bld: 5.7 % — ABNORMAL HIGH (ref 4.8–5.6)

## 2019-02-23 ENCOUNTER — Ambulatory Visit (INDEPENDENT_AMBULATORY_CARE_PROVIDER_SITE_OTHER): Payer: Medicaid Other | Admitting: Advanced Practice Midwife

## 2019-02-23 ENCOUNTER — Other Ambulatory Visit: Payer: Self-pay

## 2019-02-23 VITALS — BP 109/78 | HR 72 | Wt 200.5 lb

## 2019-02-23 DIAGNOSIS — Z3482 Encounter for supervision of other normal pregnancy, second trimester: Secondary | ICD-10-CM

## 2019-02-23 DIAGNOSIS — Z3A14 14 weeks gestation of pregnancy: Secondary | ICD-10-CM

## 2019-02-23 DIAGNOSIS — O99212 Obesity complicating pregnancy, second trimester: Secondary | ICD-10-CM

## 2019-02-23 DIAGNOSIS — E669 Obesity, unspecified: Secondary | ICD-10-CM

## 2019-02-23 NOTE — Progress Notes (Addendum)
PRENATAL VISIT NOTE  Subjective:  Barbara Waller is a 33 y.o. G3P2002 at [redacted]w[redacted]d being seen today for ongoing prenatal care.  She is currently monitored for the following issues for this low-risk pregnancy and has Supervision of other normal pregnancy, antepartum; Obesity in pregnancy; Prediabetes; Shellfish allergy; Screening breast examination; and Abdominal pain affecting pregnancy on their problem list.   Patient reports backache and fatigue.Her pain is focused in her lower back pain, burning in her hips and cramping in her ankles and lower extremities.  Movement: Present. Denies leaking of fluid.  Patient reports improvement of symptoms related to her vulvovaginal Candidiasis.   Today patient reports improved nausea with eating a snack and drinking ginger ale to settle her stomach. Patient shares that she has been feeling intermittent "flutters."   The following portions of the patient's history were reviewed and updated as appropriate: allergies, current medications, past family history, past medical history, past social history, past surgical history and problem list.   Objective:   Vitals:   02/23/19 0935  BP: 109/78  Pulse: 72  Weight: 200 lb 8 oz (90.9 kg)    Fetal Status: Fetal Heart Rate (bpm): 144   Movement: Present     General:  Alert, oriented and cooperative. Patient is in no acute distress.  Skin: Skin is warm and dry. No rash noted.   Cardiovascular: Normal heart rate noted  Respiratory: Normal respiratory effort, no problems with respiration noted  Abdomen: Soft, gravid, appropriate for gestational age.  Pain/Pressure: Absent     Pelvic: Cervical exam deferred        Extremities: Normal range of motion.  Edema: None  Mental Status: Normal mood and affect. Normal behavior. Normal judgment and thought content.   Assessment and Plan:  Pregnancy: G3P2002 at [redacted]w[redacted]d 1. Encounter for supervision of other normal pregnancy in second trimester Nikiah's  pregnancy is progressing appropriately. Patient is at moderate risk for development of pre eclampsia given her race and BMI and has been counseled and offered initiation of low dose aspirin for prevention. Today patient reports that she has decided to forego prophylactic therapy. Patient counseled on blood pressure monitoring. She does report issues setting up her 56 app.  - [place] MFM OB COMP + 14 WK; Future - virtual visit at 20 weeks  2. Obesity (BMI 30-39.9) Patient has a total weight gain of approximately 10lbs. Patient previously counseled on recommended gestational weight gain of 11-20lbs given her BMI. Will continue to monitor.   Preterm labor symptoms and general obstetric precautions including but not limited to vaginal bleeding, contractions, leaking of fluid and fetal movement were reviewed in detail with the patient. Please refer to After Visit Summary for other counseling recommendations.   Return in about 6 weeks (around 04/06/2019) for Virtual LOB.  Future Appointments  Date Time Provider Ruby  03/28/2019  9:30 AM [facility] [place] 1 WH-MFCUS MFC-US  03/30/2019  9:00 AM Azzie Glatter, FNP [facility] None  03/30/2019 11:00 AM Lajean Manes, CNM [facility] [place], Medical Student   I confirm that I have verified the information documented in the medical student's note and that I have also personally reperformed the history, physical exam and all medical decision making activities of this service and have verified that all service and findings are accurately documented in this student's note.   --No concerning findings today. Normotensive and asymptomatic. Continue routine care   Mallie Snooks, [place] 02/23/2019 11:38 AM

## 2019-02-23 NOTE — Patient Instructions (Signed)

## 2019-02-28 ENCOUNTER — Encounter: Payer: Self-pay | Admitting: Radiology

## 2019-02-28 ENCOUNTER — Telehealth: Payer: Self-pay | Admitting: Radiology

## 2019-02-28 NOTE — Telephone Encounter (Signed)
Patient was informed of Panorama results  

## 2019-03-28 ENCOUNTER — Ambulatory Visit (HOSPITAL_COMMUNITY)
Admission: RE | Admit: 2019-03-28 | Discharge: 2019-03-28 | Disposition: A | Discharge status: Home / Self Care | Payer: Medicaid Other | Source: Ambulatory Visit | Attending: Obstetrics and Gynecology | Admitting: Obstetrics and Gynecology

## 2019-03-28 ENCOUNTER — Other Ambulatory Visit (HOSPITAL_COMMUNITY): Payer: Self-pay | Admitting: *Deleted

## 2019-03-28 ENCOUNTER — Other Ambulatory Visit: Payer: Self-pay | Admitting: Advanced Practice Midwife

## 2019-03-28 ENCOUNTER — Other Ambulatory Visit: Payer: Self-pay

## 2019-03-28 DIAGNOSIS — O99212 Obesity complicating pregnancy, second trimester: Secondary | ICD-10-CM

## 2019-03-28 DIAGNOSIS — Z3482 Encounter for supervision of other normal pregnancy, second trimester: Secondary | ICD-10-CM | POA: Diagnosis not present

## 2019-03-28 DIAGNOSIS — O09522 Supervision of elderly multigravida, second trimester: Secondary | ICD-10-CM

## 2019-03-28 DIAGNOSIS — O4442 Low lying placenta NOS or without hemorrhage, second trimester: Secondary | ICD-10-CM

## 2019-03-28 DIAGNOSIS — Z3A19 19 weeks gestation of pregnancy: Secondary | ICD-10-CM

## 2019-03-29 ENCOUNTER — Encounter: Payer: Self-pay | Admitting: Advanced Practice Midwife

## 2019-03-29 ENCOUNTER — Ambulatory Visit (HOSPITAL_COMMUNITY)
Admission: EM | Admit: 2019-03-29 | Discharge: 2019-03-29 | Disposition: A | Discharge status: Home / Self Care | Payer: Medicaid Other | Attending: Family Medicine | Admitting: Family Medicine

## 2019-03-29 ENCOUNTER — Other Ambulatory Visit: Payer: Self-pay

## 2019-03-29 ENCOUNTER — Encounter (HOSPITAL_COMMUNITY): Payer: Self-pay

## 2019-03-29 DIAGNOSIS — O4440 Low lying placenta NOS or without hemorrhage, unspecified trimester: Secondary | ICD-10-CM | POA: Insufficient documentation

## 2019-03-29 DIAGNOSIS — J020 Streptococcal pharyngitis: Secondary | ICD-10-CM | POA: Diagnosis not present

## 2019-03-29 LAB — POCT RAPID STREP A: Streptococcus, Group A Screen (Direct): POSITIVE — AB

## 2019-03-29 MED ORDER — PENICILLIN G BENZATHINE 1200000 UNIT/2ML IM SUSP
1.2000 10*6.[IU] | Freq: Once | INTRAMUSCULAR | Status: AC
  Administered 2019-03-29: 09:00:00 1.2 10*6.[IU] via INTRAMUSCULAR

## 2019-03-29 MED ORDER — PENICILLIN G BENZATHINE 1200000 UNIT/2ML IM SUSP
INTRAMUSCULAR | Status: AC
  Filled 2019-03-29: qty 2

## 2019-03-29 NOTE — Discharge Instructions (Signed)
Treating you for strep throat Antibiotic injection given here in clinic.  You can take Tylenol for fever and pain. Follow up as needed for continued or worsening symptoms

## 2019-03-29 NOTE — ED Provider Notes (Signed)
[facility]    CSN: [id number] Arrival date & time: 03/29/19  4098      History   Chief Complaint Chief Complaint  Patient presents with  . Sore Throat    HPI Barbara Waller is a 33 y.o. female.   Pt is a 33 year old female with PMH of anemia, breast cyst, chronic back pain, LSIL, influenza, obesity. She presents today with sore throat x 3 days.  Symptoms have been constant.  Pain with swallowing.  She has been doing warm salt water gargles and take ibuprofen with minimal relief.  Denies any associated cough, chest congestion, fever, chills, body aches, ear pain.  No recent sick contacts or recent traveling.  ROS per HPI      Past Medical History:  Diagnosis Date  . Anemia 08/04/2017  . BMI 32.0-32.9,adult 06/26/2015  . Breast cyst, left 10/02/2018  . Breast lump on left side at 6 o'clock position 10/21/2018  . Breast pain, left 10/02/2018  . Chronic midline low back pain without sciatica 05/16/2016  . Flu-like symptoms 05/25/2018  . Influenza B 05/25/2018  . Left breast abscess 06/26/2015  . 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 Obesity   . Sore throat 05/25/2018  . Vaginal Pap smear, abnormal   . Vitamin D deficiency 08/04/2017    Patient Active Problem List   Diagnosis Date Noted  . Low-lying placenta 03/29/2019  . Abdominal pain affecting pregnancy 01/10/2019  . Screening breast examination 10/21/2018  . 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 Prediabetes 01/03/2016  . Obesity in pregnancy 04/11/2014  . Supervision of other normal pregnancy, antepartum 01/16/2014    Past Surgical History:  Procedure Laterality Date  . axilla abscess    . COLOSTOMY      OB History    Gravida  3   Para  2   Term  2   Preterm      AB      Living  2     SAB      TAB      Ectopic      Multiple  0   Live Births  2            Home Medications    Prior to Admission medications   Medication  Sig Start Date End Date Taking? Authorizing Provider  Biotin 1000 MCG CHEW Chew by mouth.    [provider]  Blood Pressure Monitoring DEVI Check blood pressure 1-2 times per week and make sure to document in baby scripts. 01/26/19   Clayton Bibles C, CNM  Omega-3 Fatty Acids (FISH OIL) 1000 MG CAPS Take by mouth.    [provider]  Prenatal Vit-Fe Fumarate-FA (PRENATAL MULTIVITAMIN) TABS tablet Take 1 tablet by mouth daily at 12 noon.    [provider]    Family History Family History  Problem Relation Age of Onset  . Diabetes Mother   . Hypertension Mother   . Diabetes Maternal Grandmother   . Diabetes Father   . Heart failure Father   . Anesthesia problems Neg Hx     Social History Social History   Tobacco Use  . Smoking status: Never Smoker  . Smokeless tobacco: Never Used  Substance Use Topics  . Alcohol use: No  . Drug use: No     Allergies   Ferrous sulfate and Shellfish allergy   Review of Systems Review of Systems   Physical Exam  Triage Vital Signs ED Triage Vitals  Enc Vitals Group     BP 03/29/19 0844 104/65     Pulse Rate 03/29/19 0844 87     Resp 03/29/19 0844 18     Temp 03/29/19 0844 98.2 F (36.8 C)     Temp Source 03/29/19 0844 Oral     SpO2 03/29/19 0844 100 %     Weight 03/29/19 0842 200 lb (90.7 kg)     Height --      Head Circumference --      Peak Flow --      Pain Score 03/29/19 0837 8     Pain Loc --      Pain Edu? --      Excl. in GC? --    No data found.  Updated Vital Signs BP 104/65 (BP Location: Left Arm)   Pulse 87   Temp 98.2 F (36.8 C) (Oral)   Resp 18   Wt 200 lb (90.7 kg)   LMP 11/15/2018   SpO2 100%   BMI 32.28 kg/m   Visual Acuity Right Eye Distance:   Left Eye Distance:   Bilateral Distance:    Right Eye Near:   Left Eye Near:    Bilateral Near:     Physical Exam Vitals and nursing note reviewed.  Constitutional:      General: She is not in acute distress.     Appearance: Normal appearance. She is not ill-appearing, toxic-appearing or diaphoretic.  HENT:     Head: Normocephalic.     Nose: Nose normal.     Mouth/Throat:     Pharynx: Oropharynx is clear. Uvula midline. Posterior oropharyngeal erythema present.     Tonsils: Tonsillar exudate present. 2+ on the right. 2+ on the left.  Eyes:     Conjunctiva/sclera: Conjunctivae normal.  Pulmonary:     Effort: Pulmonary effort is normal.  Musculoskeletal:        General: Normal range of motion.     Cervical back: Normal range of motion.  Skin:    General: Skin is warm and dry.     Findings: No rash.  Neurological:     Mental Status: She is alert.  Psychiatric:        Mood and Affect: Mood normal.      UC Treatments / Results  Labs (all labs ordered are listed, but only abnormal results are displayed) Labs Reviewed  POCT RAPID STREP A - Abnormal; Notable for the following components:      Result Value   Streptococcus, Group A Screen (Direct) POSITIVE (*)    All other components within normal limits    EKG   Radiology [place] MFM OB DETAIL +14 WK  Result Date: 03/28/2019 ----------------------------------------------------------------------  OBSTETRICS REPORT                       (Signed Final 03/28/2019 11:11 am) ---------------------------------------------------------------------- Patient Info  ID #:       [id number]                          D.O.B.:  16-Sep-1985 (33 yrs)  Name:       Barbara Waller                   Visit Date: 03/28/2019 09:55 am              [place] ---------------------------------------------------------------------- Performed By  Performed By:  Lenise Arena        Referred By:      Barbie Haggis  Attending:        Ma Rings MD         [facility]  ---------------------------------------------------------------------- Orders   #  Description                          Code         Ordered By   1  [place] MFM OB DETAIL +14 WK              76811.01     Clayton Bibles  ----------------------------------------------------------------------   #  Order #                    Accession #                 Episode #   1  [id number]                  [id number]                  [id number]  ---------------------------------------------------------------------- Indications   Advanced maternal age multigravida 59+,        O63.522   second trimester   Obesity complicating pregnancy, second         O99.212   trimester   Encounter for antenatal screening for          Z36.3   malformations (low risk NIPS)   Low lying placenta, antepartum                 O44.40   [redacted] weeks gestation of pregnancy                Z3A.19  ---------------------------------------------------------------------- Vital Signs  Weight (lb): 200                               Height:        5'6"  BMI:         32.28 ---------------------------------------------------------------------- Fetal Evaluation  Num Of Fetuses:         1  Cardiac Activity:       Observed  Presentation:           Cephalic  Placenta:  Anterior, low-lying, 1.9 cm from int os  P. Cord Insertion:      Visualized, central  Amniotic Fluid  AFI FV:      Within normal limits                              Largest Pocket(cm)                              5.02 ---------------------------------------------------------------------- Biometry  BPD:      42.4  mm     G. Age:  18w 6d         43  %    CI:        74.85   %    70 - 86                                                          FL/HC:      19.6   %    16.1 - 18.3  HC:      155.5  mm     G. Age:  18w 3d         19  %    HC/AC:      1.04        1.09 - 1.39  AC:      149.3  mm     G. Age:  20w 1d         82  %    FL/BPD:     71.9   %  FL:        30.5  mm     G. Age:  19w 3d         60  %    FL/AC:      20.4   %    20 - 24  CER:      18.9  mm     G. Age:  18w 3d         34  %  NFT:       3.1  mm  LV:        5.8  mm  CM:        4.9  mm  Est. FW:     306  gm    0 lb 11 oz      83  % ---------------------------------------------------------------------- OB History  Gravidity:    3         Term:   2  Living:       2 ---------------------------------------------------------------------- Gestational Age  LMP:           19w 0d        Date:  11/15/18                 EDD:   08/22/19  U/S Today:     19w 2d                                        EDD:   08/20/19  Best:          19w 0d     Det. By:  LMP  (11/15/18)          EDD:   08/22/19 ---------------------------------------------------------------------- Anatomy  Cranium:               Appears normal         Aortic Arch:            Appears normal  Cavum:                 Appears normal         Ductal Arch:            Appears normal  Ventricles:            Appears normal         Diaphragm:              Appears normal  Choroid Plexus:        Appears normal         Stomach:                Appears normal, left                                                                        sided  Cerebellum:            Appears normal         Abdomen:                Appears normal  Posterior Fossa:       Appears normal         Abdominal Wall:         Appears nml (cord                                                                        insert, abd wall)  Nuchal Fold:           Appears normal         Cord Vessels:           Appears normal (3                                                                        vessel cord)  Face:                  Appears normal         Kidneys:                Appear normal                         (orbits and profile)  Lips:  Appears normal         Bladder:                Appears normal  Thoracic:              Appears normal         Spine:                  Not well visualized   Heart:                 Appears normal         Upper Extremities:      Appears normal                         (4CH, axis, and                         situs)  RVOT:                  Appears normal         Lower Extremities:      Appears normal  LVOT:                  Appears normal  Other:  Female gender. Heels and 5th digit visualized. Technically difficult due          to fetal position. ---------------------------------------------------------------------- Cervix Uterus Adnexa  Cervix  Length:           3.45  cm.  Normal appearance by transabdominal scan.  Uterus  No abnormality visualized.  Left Ovary  Within normal limits.  Right Ovary  Within normal limits.  Cul De Sac  No free fluid seen.  Adnexa  No abnormality visualized. ---------------------------------------------------------------------- Comments  This patient was seen for a detailed fetal anatomy scan due  to advanced maternal age. She denies any significant past  medical history and denies any problems in her current  pregnancy.  She had a cell free DNA test earlier in her pregnancy which  indicated a low risk for trisomy 33, 35, and 13. A female fetus is  predicted.  She was informed that the fetal growth and amniotic fluid  level were appropriate for her gestational age.  There were no obvious fetal anomalies noted on today's  ultrasound exam.  The patient was informed that anomalies may be missed due  to technical limitations. If the fetus is in a suboptimal position  or maternal habitus is increased, visualization of the fetus in  the maternal uterus may be impaired.  The increased risk of fetal aneuploidy due to advanced  maternal age was discussed. Due to advanced maternal age,  the patient was offered and declined an amniocentesis today  for definitive diagnosis of fetal aneuploidy.  A low-lying placenta was noted on today's ultrasound.  Precautions associated with a placenta previa were  discussed.  A follow-up exam was scheduled in 4 weeks to  assess the  fetal growth and placental location. ----------------------------------------------------------------------                   Johnell Comings, MD Electronically Signed Final Report   03/28/2019 11:11 am ----------------------------------------------------------------------   Procedures Procedures (including critical care time)  Medications Ordered in UC Medications  penicillin g benzathine (BICILLIN LA) 1200000 UNIT/2ML injection 1.2 Million Units (1.2 Million Units Intramuscular Given 03/29/19 0925)    Initial Impression / Assessment and Plan / UC  Course  I have reviewed the triage vital signs and the nursing notes.  Pertinent labs & imaging results that were available during my care of the patient were reviewed by me and considered in my medical decision making (see chart for details).     Strep throat- rapid strep test positive  Treating with penicillin injection here in clinic. Tylenol as needed for pain Follow up as needed for continued or worsening symptoms  Final Clinical Impressions(s) / UC Diagnoses   Final diagnoses:  Strep throat     Discharge Instructions     Treating you for strep throat Antibiotic injection given here in clinic.  You can take Tylenol for fever and pain. Follow up as needed for continued or worsening symptoms     ED Prescriptions    None     PDMP not reviewed this encounter.   Janace Aris, NP 03/30/19 [phone]

## 2019-03-29 NOTE — ED Triage Notes (Signed)
Pt. States states she went to visit her bf in [facility] on & came back with a sore throat it started Sunday.

## 2019-03-30 ENCOUNTER — Encounter: Payer: Self-pay | Admitting: Family Medicine

## 2019-03-30 ENCOUNTER — Telehealth (INDEPENDENT_AMBULATORY_CARE_PROVIDER_SITE_OTHER): Payer: Medicaid Other | Admitting: Family Medicine

## 2019-03-30 ENCOUNTER — Ambulatory Visit: Payer: No Typology Code available for payment source | Admitting: Family Medicine

## 2019-03-30 VITALS — BP 104/65 | HR 70 | Wt 200.0 lb

## 2019-03-30 DIAGNOSIS — O4442 Low lying placenta NOS or without hemorrhage, second trimester: Secondary | ICD-10-CM | POA: Diagnosis not present

## 2019-03-30 DIAGNOSIS — O26892 Other specified pregnancy related conditions, second trimester: Secondary | ICD-10-CM

## 2019-03-30 DIAGNOSIS — Z3480 Encounter for supervision of other normal pregnancy, unspecified trimester: Secondary | ICD-10-CM

## 2019-03-30 DIAGNOSIS — O99210 Obesity complicating pregnancy, unspecified trimester: Secondary | ICD-10-CM

## 2019-03-30 DIAGNOSIS — O4440 Low lying placenta NOS or without hemorrhage, unspecified trimester: Secondary | ICD-10-CM

## 2019-03-30 DIAGNOSIS — O99212 Obesity complicating pregnancy, second trimester: Secondary | ICD-10-CM | POA: Diagnosis not present

## 2019-03-30 DIAGNOSIS — Z3A19 19 weeks gestation of pregnancy: Secondary | ICD-10-CM | POA: Diagnosis not present

## 2019-03-30 DIAGNOSIS — R109 Unspecified abdominal pain: Secondary | ICD-10-CM | POA: Diagnosis not present

## 2019-03-30 DIAGNOSIS — O26899 Other specified pregnancy related conditions, unspecified trimester: Secondary | ICD-10-CM

## 2019-03-30 NOTE — Progress Notes (Signed)
Blood pressure was from yesterday urgent care visit

## 2019-03-30 NOTE — Progress Notes (Signed)
I connected with@ on 03/30/19 at 11:00 AM EST by: MyChart and verified that I am speaking with the correct person using two identifiers.  Patient is located at Home and provider is located at [facility].     The purpose of this virtual visit is to provide medical care while limiting exposure to the novel coronavirus. I discussed the limitations, risks, security and privacy concerns of performing an evaluation and management service by MyChart and the availability of in person appointments. I also discussed with the patient that there may be a patient responsible charge related to this service. By engaging in this virtual visit, you consent to the provision of healthcare.  Additionally, you authorize for your insurance to be billed for the services provided during this visit.  The patient expressed understanding and agreed to proceed.  The following staff members participated in the virtual visit:  CMA    PRENATAL VISIT NOTE  Subjective:  Barbara Waller is a 33 y.o. G3P2002 at [redacted]w[redacted]d  for phone visit for ongoing prenatal care.  She is currently monitored for the following issues for this high-risk pregnancy and has Supervision of other normal pregnancy, antepartum; Obesity in pregnancy; Prediabetes; Shellfish allergy; Abdominal pain affecting pregnancy; and Low-lying placenta on their problem list.  Patient reports no complaints. Sore throat much improved after treatment. Was given bicillin  . Vag. Bleeding: None.  Movement: Present. Denies leaking of fluid.   The following portions of the patient's history were reviewed and updated as appropriate: allergies, current medications, past family history, past medical history, past social history, past surgical history and problem list.   Objective:   Vitals:   03/30/19 1102  BP: 104/65  Pulse: 70  Weight: 200 lb (90.7 kg)   Self-Obtained  Fetal Status:     Movement: Present     Assessment and Plan:  Pregnancy: G3P2002 at [redacted]w[redacted]d 1.  Supervision of other normal pregnancy, antepartum Up to date Reviewed [place] results and pregnancy care plan Reviewed safety of bicillin in pregnancy and as effective treatment for strep throat.  Patient is still planning on moving to [facility]. We reviewed safe transfer of care plans and to alert [facility] when she knows the practice she will transfer to The patient reports she is still in mediation with ex-husband   2. Obesity in pregnancy TWG= 10 lb (4.536 kg) which is within goal. Encouraged health diet and exercise to minimize weight gain pain. Goal is 11-15 lbs  3. Abdominal pain affecting pregnancy None currently and resolved  4. Low-lying placenta Reviewed plan to repeat US to check placental location Reviewed pelvic precautions and bleeding precautions   Preterm labor symptoms and general obstetric precautions including but not limited to vaginal bleeding, contractions, leaking of fluid and fetal movement were reviewed in detail with the patient.  Return in about 4 weeks (around 04/27/2019) for Routine prenatal care, Telehealth/Virtual health OB Visit.  Future Appointments  Date Time Provider [place]  04/25/2019 10:45 AM [facility]  04/25/2019 10:45 AM [place] 2 [facility]  04/27/2019  4:15 PM Caren Macadam, MD [facility]    Time spent on virtual visit: 15 minutes  Caren Macadam, MD

## 2019-03-30 NOTE — Progress Notes (Signed)
I connected with  Barbara Waller on 03/30/19 at 11:00 AM EST by telephone and verified that I am speaking with the correct person using two identifiers.   I discussed the limitations, risks, security and privacy concerns of performing an evaluation and management service by telephone and the availability of in person appointments. I also discussed with the patient that there may be a patient responsible charge related to this service. The patient expressed understanding and agreed to proceed.  Barbara Waller, CMA 03/30/2019  11:01 AM  Patient has strep throat was seen in urgent care.

## 2019-04-15 NOTE — L&D Delivery Note (Addendum)
OB/GYN Faculty Practice Delivery Note  Barbara Waller is a 34 y.o. [id number] s/p SVD at [redacted]w[redacted]d. She was admitted for SOL.   ROM: 10h 32m with light meconium fluid GBS Status: GBS Positive/-- (04/14 1011) Maximum Maternal Temperature: 99.47F  Labor Progress: . Initial SVE: 3cm/80%/-2 after SROM. She received adequate GBS abx. She then progressed to complete.   Delivery Date/Time: 08/17/19 @ 1151 Delivery: Called to room and patient was complete and pushing. Head delivered ROA. No nuchal cord present. Shoulder and body delivered in usual fashion. Infant with spontaneous cry, placed on mother's abdomen, dried and stimulated. Cord clamped x 2 after 1-minute delay, and cut by MOB. Cord blood drawn. Placenta delivered spontaneously with gentle cord traction. Fundus firm with massage and Pitocin. Labia, perineum, vagina, and cervix inspected inspected without laceration.  Baby Weight: pending  Placenta: Sent to L&D Complications: None Lacerations: None EBL: 120 mL Analgesia: Epidural   Infant:  APGAR (1 MIN):  9 APGAR (5 MINS): 9   Dorothe Pea, DO, PGY1 [facility] 08/17/2019, 12:03 PM  OB FELLOW DELIVERY ATTESTATION  I was gloved and present for the delivery in its entirety, and I agree with the above resident's note.    Jerilynn Birkenhead, MD [facility] Fellow, [facility] for [employer], [facility]

## 2019-04-25 ENCOUNTER — Ambulatory Visit (HOSPITAL_COMMUNITY): Payer: Medicaid Other | Admitting: *Deleted

## 2019-04-25 ENCOUNTER — Ambulatory Visit (HOSPITAL_COMMUNITY)
Admission: RE | Admit: 2019-04-25 | Discharge: 2019-04-25 | Disposition: A | Discharge status: Home / Self Care | Payer: Medicaid Other | Source: Ambulatory Visit | Attending: Obstetrics and Gynecology | Admitting: Obstetrics and Gynecology

## 2019-04-25 ENCOUNTER — Encounter (HOSPITAL_COMMUNITY): Payer: Self-pay

## 2019-04-25 ENCOUNTER — Other Ambulatory Visit: Payer: Self-pay

## 2019-04-25 DIAGNOSIS — Z362 Encounter for other antenatal screening follow-up: Secondary | ICD-10-CM

## 2019-04-25 DIAGNOSIS — O4442 Low lying placenta NOS or without hemorrhage, second trimester: Secondary | ICD-10-CM | POA: Diagnosis not present

## 2019-04-25 DIAGNOSIS — O26899 Other specified pregnancy related conditions, unspecified trimester: Secondary | ICD-10-CM

## 2019-04-25 DIAGNOSIS — O09522 Supervision of elderly multigravida, second trimester: Secondary | ICD-10-CM | POA: Diagnosis not present

## 2019-04-25 DIAGNOSIS — O99212 Obesity complicating pregnancy, second trimester: Secondary | ICD-10-CM | POA: Diagnosis not present

## 2019-04-25 DIAGNOSIS — Z3A23 23 weeks gestation of pregnancy: Secondary | ICD-10-CM

## 2019-04-25 DIAGNOSIS — R109 Unspecified abdominal pain: Secondary | ICD-10-CM | POA: Insufficient documentation

## 2019-04-27 ENCOUNTER — Other Ambulatory Visit: Payer: Self-pay

## 2019-04-27 ENCOUNTER — Telehealth (INDEPENDENT_AMBULATORY_CARE_PROVIDER_SITE_OTHER): Payer: Medicaid Other | Admitting: Family Medicine

## 2019-04-27 ENCOUNTER — Encounter: Payer: Self-pay | Admitting: Family Medicine

## 2019-04-27 DIAGNOSIS — R7303 Prediabetes: Secondary | ICD-10-CM | POA: Diagnosis not present

## 2019-04-27 DIAGNOSIS — Z3480 Encounter for supervision of other normal pregnancy, unspecified trimester: Secondary | ICD-10-CM

## 2019-04-27 DIAGNOSIS — O26892 Other specified pregnancy related conditions, second trimester: Secondary | ICD-10-CM | POA: Diagnosis not present

## 2019-04-27 DIAGNOSIS — Z3A23 23 weeks gestation of pregnancy: Secondary | ICD-10-CM | POA: Diagnosis not present

## 2019-04-27 DIAGNOSIS — O26899 Other specified pregnancy related conditions, unspecified trimester: Secondary | ICD-10-CM

## 2019-04-27 DIAGNOSIS — O4442 Low lying placenta NOS or without hemorrhage, second trimester: Secondary | ICD-10-CM | POA: Diagnosis not present

## 2019-04-27 DIAGNOSIS — O4440 Low lying placenta NOS or without hemorrhage, unspecified trimester: Secondary | ICD-10-CM

## 2019-04-27 DIAGNOSIS — R109 Unspecified abdominal pain: Secondary | ICD-10-CM

## 2019-04-27 DIAGNOSIS — O99212 Obesity complicating pregnancy, second trimester: Secondary | ICD-10-CM | POA: Diagnosis not present

## 2019-04-27 DIAGNOSIS — O99210 Obesity complicating pregnancy, unspecified trimester: Secondary | ICD-10-CM

## 2019-04-27 NOTE — Progress Notes (Signed)
I connected with@ on 04/27/19 at  4:15 PM EST by: Telephone and verified that I am speaking with the correct person using two identifiers.  Patient is located at home and provider is located at [facility].     The purpose of this virtual visit is to provide medical care while limiting exposure to the novel coronavirus. I discussed the limitations, risks, security and privacy concerns of performing an evaluation and management service by telephone and the availability of in person appointments. I also discussed with the patient that there may be a patient responsible charge related to this service. By engaging in this virtual visit, you consent to the provision of healthcare.  Additionally, you authorize for your insurance to be billed for the services provided during this visit.  The patient expressed understanding and agreed to proceed.  The following staff members participated in the virtual visit:  Scheryl Marten    PRENATAL VISIT NOTE  Subjective:  Barbara Waller is a 34 y.o. G3P2002 at [redacted]w[redacted]d  for phone visit for ongoing prenatal care.  She is currently monitored for the following issues for this low-risk pregnancy and has Supervision of other normal pregnancy, antepartum; Obesity in pregnancy; Prediabetes; Shellfish allergy; Abdominal pain affecting pregnancy; and Low-lying placenta on their problem list.  Patient reports no complaints.  Contractions: Irritability. Vag. Bleeding: None.  Movement: Present. Denies leaking of fluid.   The following portions of the patient's history were reviewed and updated as appropriate: allergies, current medications, past family history, past medical history, past social history, past surgical history and problem list.   Objective:  There were no vitals filed for this visit. Self-Obtained  Fetal Status:     Movement: Present     Assessment and Plan:  Pregnancy: G3P2002 at [redacted]w[redacted]d  1. Abdominal pain affecting pregnancy Braxton Hicks ctx occasionally    2. Low-lying placenta Resolved   3. Obesity in pregnancy TWG= 10 lb (4.536 kg)   4. Supervision of other normal pregnancy, antepartum Up to date  Interested in BTL and discussed signing consent for medicaid at next visit  5. Prediabetes Will have 28wk  Preterm labor symptoms and general obstetric precautions including but not limited to vaginal bleeding, contractions, leaking of fluid and fetal movement were reviewed in detail with the patient.  Return in about 4 weeks (around 05/25/2019) for Routine prenatal care, Telehealth/Virtual health OB Visit.  Future Appointments  Date Time Provider Department Center  05/26/2019  8:15 AM Reva Bores, MD [facility]     Time spent on virtual visit: 15 minutes  Federico Flake, MD

## 2019-04-27 NOTE — Patient Instructions (Signed)
[place] Breastfeeding class:  [email or web address]

## 2019-04-27 NOTE — Progress Notes (Signed)
I connected with  Barbara Waller on 04/27/19 at  4:15 PM EST by telephone and verified that I am speaking with the correct person using two identifiers.   I discussed the limitations, risks, security and privacy concerns of performing an evaluation and management service by telephone and the availability of in person appointments. I also discussed with the patient that there may be a patient responsible charge related to this service. The patient expressed understanding and agreed to proceed.  Scheryl Marten, RN 04/27/2019  4:28 PM

## 2019-05-02 ENCOUNTER — Telehealth: Payer: Self-pay

## 2019-05-02 NOTE — Telephone Encounter (Signed)
Received call from patient-she reports having a dizzy episode this am but stated she feels ok now. I ask if she was able to take her blood pressure doing the time the dizziness happen? She was not able to take her blood pressure at that time.She reports feeling better since eating. I have advised patient to continued to monitor her symptoms and to make sure she is drinking plenty of fluids. Patient voice understanding at this time.

## 2019-05-05 ENCOUNTER — Encounter: Payer: Self-pay | Admitting: Radiology

## 2019-05-26 ENCOUNTER — Ambulatory Visit (INDEPENDENT_AMBULATORY_CARE_PROVIDER_SITE_OTHER): Payer: Medicaid Other | Admitting: Family Medicine

## 2019-05-26 ENCOUNTER — Other Ambulatory Visit: Payer: Self-pay

## 2019-05-26 VITALS — BP 114/73 | HR 77 | Wt 208.0 lb

## 2019-05-26 DIAGNOSIS — Z3A27 27 weeks gestation of pregnancy: Secondary | ICD-10-CM

## 2019-05-26 DIAGNOSIS — O4440 Low lying placenta NOS or without hemorrhage, unspecified trimester: Secondary | ICD-10-CM

## 2019-05-26 DIAGNOSIS — O4442 Low lying placenta NOS or without hemorrhage, second trimester: Secondary | ICD-10-CM

## 2019-05-26 DIAGNOSIS — Z23 Encounter for immunization: Secondary | ICD-10-CM

## 2019-05-26 DIAGNOSIS — Z3480 Encounter for supervision of other normal pregnancy, unspecified trimester: Secondary | ICD-10-CM | POA: Diagnosis not present

## 2019-05-26 LAB — CBC
Hematocrit: 35.9 % (ref 34.0–46.6)
Hemoglobin: 11.4 g/dL (ref 11.1–15.9)
MCH: 25.7 pg — ABNORMAL LOW (ref 26.6–33.0)
MCHC: 31.8 g/dL (ref 31.5–35.7)
MCV: 81 fL (ref 79–97)
Platelets: 306 10*3/uL (ref 150–450)
RBC: 4.43 x10E6/uL (ref 3.77–5.28)
RDW: 13.8 % (ref 11.7–15.4)
WBC: 7.5 10*3/uL (ref 3.4–10.8)

## 2019-05-26 MED ORDER — COMFORT FIT MATERNITY SUPP LG MISC: 1.0000 | Freq: Every day | 0 refills | Status: DC | PRN

## 2019-05-26 NOTE — Progress Notes (Signed)
    PRENATAL VISIT NOTE  Subjective:  Barbara Waller is a 34 y.o. G3P2002 at [redacted]w[redacted]d being seen today for ongoing prenatal care.  She is currently monitored for the following issues for this low-risk pregnancy and has Supervision of other normal pregnancy, antepartum; Obesity in pregnancy; Prediabetes; Shellfish allergy; and Low-lying placenta on their problem list.  Patient reports no complaints.  Contractions: Irritability. Vag. Bleeding: None.  Movement: Present. Denies leaking of fluid.   The following portions of the patient's history were reviewed and updated as appropriate: allergies, current medications, past family history, past medical history, past social history, past surgical history and problem list.   Objective:   Vitals:   05/26/19 0829  BP: 114/73  Pulse: 77  Weight: 208 lb (94.3 kg)    Fetal Status: Fetal Heart Rate (bpm): 136 Fundal Height: 27 cm Movement: Present     General:  Alert, oriented and cooperative. Patient is in no acute distress.  Skin: Skin is warm and dry. No rash noted.   Cardiovascular: Normal heart rate noted  Respiratory: Normal respiratory effort, no problems with respiration noted  Abdomen: Soft, gravid, appropriate for gestational age.  Pain/Pressure: Present     Pelvic: Cervical exam deferred        Extremities: Normal range of motion.  Edema: None  Mental Status: Normal mood and affect. Normal behavior. Normal judgment and thought content.   Assessment and Plan:  Pregnancy: G3P2002 at [redacted]w[redacted]d 1. Supervision of other normal pregnancy, antepartum 28 wk labs and TDaP today - Glucose Tolerance, 2 Hours w/1 Hour - CBC - RPR - HIV Antibody (routine testing w rflx) - Elastic Bandages & Supports (COMFORT FIT MATERNITY SUPP LG) MISC; 1 Device by Does not apply route daily as needed.  Dispense: 1 each; Refill: 0  2. Low-lying placenta Resolved.  Preterm labor symptoms and general obstetric precautions including but not limited to  vaginal bleeding, contractions, leaking of fluid and fetal movement were reviewed in detail with the patient. Please refer to After Visit Summary for other counseling recommendations.   Return in 2 weeks (on 06/09/2019) for virtual.  No future appointments.  Reva Bores, MD

## 2019-05-26 NOTE — Progress Notes (Signed)
Pt is experiencing pain and pressure on lower abdomen and on lower back during nigh time

## 2019-05-26 NOTE — Patient Instructions (Signed)

## 2019-05-27 LAB — GLUCOSE TOLERANCE, 2 HOURS W/ 1HR
Glucose, 1 hour: 137 mg/dL (ref 65–179)
Glucose, 2 hour: 92 mg/dL (ref 65–152)
Glucose, Fasting: 82 mg/dL (ref 65–91)

## 2019-05-27 LAB — RPR: RPR Ser Ql: NONREACTIVE

## 2019-05-27 LAB — HIV ANTIBODY (ROUTINE TESTING W REFLEX): HIV Screen 4th Generation wRfx: NONREACTIVE

## 2019-05-30 ENCOUNTER — Other Ambulatory Visit: Payer: Self-pay | Admitting: *Deleted

## 2019-05-30 ENCOUNTER — Telehealth (INDEPENDENT_AMBULATORY_CARE_PROVIDER_SITE_OTHER): Payer: Medicaid Other | Admitting: Family Medicine

## 2019-05-30 ENCOUNTER — Other Ambulatory Visit: Payer: Self-pay

## 2019-05-30 DIAGNOSIS — Z3483 Encounter for supervision of other normal pregnancy, third trimester: Secondary | ICD-10-CM

## 2019-05-30 DIAGNOSIS — Z3480 Encounter for supervision of other normal pregnancy, unspecified trimester: Secondary | ICD-10-CM

## 2019-05-30 DIAGNOSIS — Z3A28 28 weeks gestation of pregnancy: Secondary | ICD-10-CM

## 2019-05-30 MED ORDER — FAMOTIDINE 20 MG PO TABS: 20.0000 mg | ORAL_TABLET | Freq: Two times a day (BID) | ORAL | 3 refills | Status: DC

## 2019-05-30 MED FILL — FAMOTIDINE 20 MG TABS: 20 | 30 days supply | Qty: 60 | Fill #0

## 2019-05-30 NOTE — Progress Notes (Signed)
Since last night started feeling very tired, having nausea, 'sour burps" and then diarrhea x 7. Denies any vomiting.   I connected with  [name] on 05/30/19 at  3:45 PM EST by telephone and verified that I am speaking with the correct person using two identifiers.   I discussed the limitations, risks, security and privacy concerns of performing an evaluation and management service by telephone and the availability of in person appointments. I also discussed with the patient that there may be a patient responsible charge related to this service. The patient expressed understanding and agreed to proceed.  Scheryl Marten, RN 05/30/2019  3:40 PM

## 2019-05-30 NOTE — Progress Notes (Signed)
.     TELEHEALTH OBSTETRICS PRENATAL VIRTUAL VIDEO VISIT ENCOUNTER NOTE  Provider location: Center for [facility]   I connected with Barbara Waller on 05/30/19 at  3:45 PM EST by MyChart Video Encounter at home and verified that I am speaking with the correct person using two identifiers.   I discussed the limitations, risks, security and privacy concerns of performing an evaluation and management service virtually and the availability of in person appointments. I also discussed with the patient that there may be a patient responsible charge related to this service. The patient expressed understanding and agreed to proceed. Subjective:  Barbara Waller is a 34 y.o. G3P2002 at [redacted]w[redacted]d being seen today for ongoing prenatal care.  She is currently monitored for the following issues for this low-risk pregnancy and has Supervision of other normal pregnancy, antepartum; Obesity in pregnancy; Prediabetes; and Shellfish allergy on their problem list.  Patient reports no complaints.  Contractions: Not present. Vag. Bleeding: None.  Movement: Present. Denies any leaking of fluid.   The following portions of the patient's history were reviewed and updated as appropriate: allergies, current medications, past family history, past medical history, past social history, past surgical history and problem list.   Objective:  There were no vitals filed for this visit.  Fetal Status:     Movement: Present     General:  Alert, oriented and cooperative. Patient is in no acute distress.  Respiratory: Normal respiratory effort, no problems with respiration noted  Mental Status: Normal mood and affect. Normal behavior. Normal judgment and thought content.  Rest of physical exam deferred due to type of encounter  Imaging: No results found.  Assessment and Plan:  Pregnancy: G3P2002 at [redacted]w[redacted]d 1. Supervision of other normal pregnancy, antepartum Continue routine prenatal care.  Nml 28 wk labs. Has diarrhea and other fatigue and headache--will get COVID testing.   Preterm labor symptoms and general obstetric precautions including but not limited to vaginal bleeding, contractions, leaking of fluid and fetal movement were reviewed in detail with the patient. I discussed the assessment and treatment plan with the patient. The patient was provided an opportunity to ask questions and all were answered. The patient agreed with the plan and demonstrated an understanding of the instructions. The patient was advised to call back or seek an in-person office evaluation/go to [facility] at [facility] for any urgent or concerning symptoms. Please refer to After Visit Summary for other counseling recommendations.   I provided 8 minutes of face-to-face time during this encounter.  Return in about 2 weeks (around 06/13/2019) for virtual.  Future Appointments  Date Time Provider Department Center  06/08/2019  3:30 PM Federico Flake, MD [facility]    Reva Bores, MD Center for [facility], [facility]

## 2019-05-31 DIAGNOSIS — Z03818 Encounter for observation for suspected exposure to other biological agents ruled out: Secondary | ICD-10-CM | POA: Diagnosis not present

## 2019-05-31 DIAGNOSIS — Z20828 Contact with and (suspected) exposure to other viral communicable diseases: Secondary | ICD-10-CM | POA: Diagnosis not present

## 2019-06-07 DIAGNOSIS — Z20828 Contact with and (suspected) exposure to other viral communicable diseases: Secondary | ICD-10-CM | POA: Diagnosis not present

## 2019-06-07 DIAGNOSIS — Z03818 Encounter for observation for suspected exposure to other biological agents ruled out: Secondary | ICD-10-CM | POA: Diagnosis not present

## 2019-06-08 ENCOUNTER — Other Ambulatory Visit: Payer: Self-pay

## 2019-06-08 ENCOUNTER — Telehealth (INDEPENDENT_AMBULATORY_CARE_PROVIDER_SITE_OTHER): Payer: Medicaid Other | Admitting: Family Medicine

## 2019-06-08 DIAGNOSIS — O99891 Other specified diseases and conditions complicating pregnancy: Secondary | ICD-10-CM | POA: Diagnosis not present

## 2019-06-08 DIAGNOSIS — O99213 Obesity complicating pregnancy, third trimester: Secondary | ICD-10-CM | POA: Diagnosis not present

## 2019-06-08 DIAGNOSIS — O99210 Obesity complicating pregnancy, unspecified trimester: Secondary | ICD-10-CM

## 2019-06-08 DIAGNOSIS — Z3A29 29 weeks gestation of pregnancy: Secondary | ICD-10-CM | POA: Diagnosis not present

## 2019-06-08 DIAGNOSIS — R7303 Prediabetes: Secondary | ICD-10-CM | POA: Diagnosis not present

## 2019-06-08 DIAGNOSIS — Z3480 Encounter for supervision of other normal pregnancy, unspecified trimester: Secondary | ICD-10-CM

## 2019-06-08 NOTE — Patient Instructions (Signed)
[email or web address]

## 2019-06-08 NOTE — Progress Notes (Signed)
I connected with  Barbara Waller on 06/08/19 at  3:30 PM EST by telephone and verified that I am speaking with the correct person using two identifiers.   I discussed the limitations, risks, security and privacy concerns of performing an evaluation and management service by telephone and the availability of in person appointments. I also discussed with the patient that there may be a patient responsible charge related to this service. The patient expressed understanding and agreed to proceed.  Scheryl Marten, RN 06/08/2019  3:44 PM   Would like to discuss water birth

## 2019-06-08 NOTE — Progress Notes (Signed)
I connected with@ on 06/08/19 at  3:30 PM EST by MyChart and verified that I am speaking with the correct person using two identifiers.  Patient is located at parking lot at job and provider is located at [employer].     The purpose of this virtual visit is to provide medical care while limiting exposure to the novel coronavirus. I discussed the limitations, risks, security and privacy concerns of performing an evaluation and management service by MyChart and the availability of in person appointments. I also discussed with the patient that there may be a patient responsible charge related to this service. By engaging in this virtual visit, you consent to the provision of healthcare.  Additionally, you authorize for your insurance to be billed for the services provided during this visit.  The patient expressed understanding and agreed to proceed.  The following staff members participated in the virtual visit:  Scheryl Marten    PRENATAL VISIT NOTE  Subjective:  Barbara Waller is a 34 y.o. G3P2002 at [redacted]w[redacted]d  for phone visit for ongoing prenatal care.  She is currently monitored for the following issues for this high-risk pregnancy and has Supervision of other normal pregnancy, antepartum; Obesity in pregnancy; Prediabetes; and Shellfish allergy on their problem list.  Patient reports no complaints.  Contractions: Not present. Vag. Bleeding: None.  Movement: Present. Denies leaking of fluid.   Interested in [place]  The following portions of the patient's history were reviewed and updated as appropriate: allergies, current medications, past family history, past medical history, past social history, past surgical history and problem list.   Objective:  There were no vitals filed for this visit. Self-Obtained  Fetal Status:     Movement: Present     Assessment and Plan:  Pregnancy: G3P2002 at [redacted]w[redacted]d  1. Supervision of other normal pregnancy, antepartum Up to date currently Reviewed  Waterbirth briefly, recommended appt with CNM to discuss further Reviewed options for childbirth education for partner.   2. Obesity in pregnancy TWG= 18 lb (8.165 kg)   3. Prediabetes 28 wk lab wnl  Preterm labor symptoms and general obstetric precautions including but not limited to vaginal bleeding, contractions, leaking of fluid and fetal movement were reviewed in detail with the patient.  Return in about 2 weeks (around 06/22/2019) for Routine prenatal care, Telehealth/Virtual health OB Visit.  Future Appointments  Date Time Provider Department Center  06/22/2019  4:15 PM Federico Flake, MD [facility]     Time spent on virtual visit: 13 minutes  Federico Flake, MD

## 2019-06-11 DIAGNOSIS — Z03818 Encounter for observation for suspected exposure to other biological agents ruled out: Secondary | ICD-10-CM | POA: Diagnosis not present

## 2019-06-11 DIAGNOSIS — Z20828 Contact with and (suspected) exposure to other viral communicable diseases: Secondary | ICD-10-CM | POA: Diagnosis not present

## 2019-06-22 ENCOUNTER — Telehealth (INDEPENDENT_AMBULATORY_CARE_PROVIDER_SITE_OTHER): Payer: Medicaid Other | Admitting: Family Medicine

## 2019-06-22 ENCOUNTER — Other Ambulatory Visit: Payer: Self-pay

## 2019-06-22 ENCOUNTER — Encounter: Payer: Self-pay | Admitting: Family Medicine

## 2019-06-22 DIAGNOSIS — Z3A31 31 weeks gestation of pregnancy: Secondary | ICD-10-CM

## 2019-06-22 DIAGNOSIS — R7303 Prediabetes: Secondary | ICD-10-CM

## 2019-06-22 DIAGNOSIS — O99213 Obesity complicating pregnancy, third trimester: Secondary | ICD-10-CM

## 2019-06-22 DIAGNOSIS — E669 Obesity, unspecified: Secondary | ICD-10-CM

## 2019-06-22 DIAGNOSIS — Z3480 Encounter for supervision of other normal pregnancy, unspecified trimester: Secondary | ICD-10-CM

## 2019-06-22 DIAGNOSIS — O99210 Obesity complicating pregnancy, unspecified trimester: Secondary | ICD-10-CM

## 2019-06-22 NOTE — Progress Notes (Signed)
Patient has not check her blood pressure this week. Will upload a reading later on today.

## 2019-06-22 NOTE — Progress Notes (Signed)
I connected with@ on 06/22/19 at  4:15 PM EST by: Mychart and verified that I am speaking with the correct person using two identifiers.  Patient is located in her car and waited for visit until patient was parked and at home and provider is located at [facility].     The purpose of this virtual visit is to provide medical care while limiting exposure to the novel coronavirus. I discussed the limitations, risks, security and privacy concerns of performing an evaluation and management service by Mychart and the availability of in person appointments. I also discussed with the patient that there may be a patient responsible charge related to this service. By engaging in this virtual visit, you consent to the provision of healthcare.  Additionally, you authorize for your insurance to be billed for the services provided during this visit.  The patient expressed understanding and agreed to proceed.  The following staff members participated in the virtual visit:  Demetrice Cheree Ditto    PRENATAL VISIT NOTE  Subjective:  Barbara Waller is a 34 y.o. G3P2002 at [redacted]w[redacted]d  for phone visit for ongoing prenatal care.  She is currently monitored for the following issues for this low-risk pregnancy and has Supervision of other normal pregnancy, antepartum; Obesity in pregnancy; Prediabetes; and Shellfish allergy on their problem list.  Patient reports no complaints.  Contractions: Not present. Vag. Bleeding: None.  Movement: Present. Denies leaking of fluid.   The following portions of the patient's history were reviewed and updated as appropriate: allergies, current medications, past family history, past medical history, past social history, past surgical history and problem list.   Objective:  There were no vitals filed for this visit. Self-Obtained  Fetal Status:     Movement: Present     Assessment and Plan:  Pregnancy: G3P2002 at [redacted]w[redacted]d  1. Supervision of other normal pregnancy, antepartum Up to  date Patient with questions about  - partner taking off work- recommended him discussing with boss a plan for coverage from 37-40 wks and then consider being off from 40 wks until after delivery.   -Wants a water birth- given waterbirth class registration list by [employer], bring in birth plaqn and needs appt with CNM - Having allergies- recommended using claritin and benadryl  2. Prediabetes GDM screening negative this pregnancy  3. Obesity in pregnancy TWG= 18 lb (8.165 kg) which is above goal   Preterm labor symptoms and general obstetric precautions including but not limited to vaginal bleeding, contractions, leaking of fluid and fetal movement were reviewed in detail with the patient.  Return in about 2 weeks (around 07/06/2019) for Routine prenatal care, Telehealth/Virtual health OB Visit.  Future Appointments  Date Time Provider Department Center  07/06/2019  8:30 AM Federico Flake, MD [facility]  07/20/2019  8:15 AM Federico Flake, MD [facility]    Time spent on virtual visit: 15 minutes  Federico Flake, MD

## 2019-07-06 ENCOUNTER — Other Ambulatory Visit: Payer: Self-pay

## 2019-07-06 ENCOUNTER — Telehealth (INDEPENDENT_AMBULATORY_CARE_PROVIDER_SITE_OTHER): Payer: Medicaid Other | Admitting: Family Medicine

## 2019-07-06 ENCOUNTER — Encounter: Payer: Self-pay | Admitting: Family Medicine

## 2019-07-06 VITALS — BP 101/77

## 2019-07-06 DIAGNOSIS — O99213 Obesity complicating pregnancy, third trimester: Secondary | ICD-10-CM

## 2019-07-06 DIAGNOSIS — O99210 Obesity complicating pregnancy, unspecified trimester: Secondary | ICD-10-CM

## 2019-07-06 DIAGNOSIS — E669 Obesity, unspecified: Secondary | ICD-10-CM

## 2019-07-06 DIAGNOSIS — Z3A33 33 weeks gestation of pregnancy: Secondary | ICD-10-CM

## 2019-07-06 DIAGNOSIS — Z3480 Encounter for supervision of other normal pregnancy, unspecified trimester: Secondary | ICD-10-CM

## 2019-07-06 NOTE — Progress Notes (Signed)
I connected with@ on 07/06/19 at  8:30 AM EDT by: Mychart and verified that I am speaking with the correct person using two identifiers.  Patient is located at work and provider is located at [facility].     The purpose of this virtual visit is to provide medical care while limiting exposure to the novel coronavirus. I discussed the limitations, risks, security and privacy concerns of performing an evaluation and management service by Mchart and the availability of in person appointments. I also discussed with the patient that there may be a patient responsible charge related to this service. By engaging in this virtual visit, you consent to the provision of healthcare.  Additionally, you authorize for your insurance to be billed for the services provided during this visit.  The patient expressed understanding and agreed to proceed.  The following staff members participated in the virtual visit:  Scheryl Marten    PRENATAL VISIT NOTE  Subjective:  Barbara Waller is a 34 y.o. G3P2002 at [redacted]w[redacted]d  for phone visit for ongoing prenatal care.  She is currently monitored for the following issues for this low-risk pregnancy and has Supervision of other normal pregnancy, antepartum; Obesity in pregnancy; Prediabetes; and Shellfish allergy on their problem list.  Patient reports -see below.  Contractions: Irregular. Vag. Bleeding: None.  Movement: Present. Denies leaking of fluid.   Reports trouble breathing- with exertion. Feels like baby is taking up more room.  Tightness at the top of uterus, every 10-15 minutes, went away on its own Pressure- started one week ago, reports pressure worsens during the "tightness" events Vaginal pain- occasional and related to baby position  The following portions of the patient's history were reviewed and updated as appropriate: allergies, current medications, past family history, past medical history, past social history, past surgical history and problem list.    Objective:   Vitals:   07/06/19 0828  BP: 101/77   Self-Obtained  Fetal Status:     Movement: Present     Assessment and Plan:  Pregnancy: G3P2002 at [redacted]w[redacted]d  1. Supervision of other normal pregnancy, antepartum Up to date Reviewed desire for [place] and need to take the class through [facility] Will rescheduled 4/7 appt to be with CNM to further discussion waterbirth Reviewed her normal pregnancy concerns.   2. Obesity in pregnancy TWG=18 lb (8.165 kg) which is above goal but not excessive at this point  Preterm labor symptoms and general obstetric precautions including but not limited to vaginal bleeding, contractions, leaking of fluid and fetal movement were reviewed in detail with the patient.  Return in about 3 weeks (around 07/27/2019) for Routine prenatal care, in person, 36wks.  Future Appointments  Date Time Provider Department Center  07/20/2019  8:15 AM Federico Flake, MD [facility]    Time spent on virtual visit: 15 minutes  Federico Flake, MD

## 2019-07-06 NOTE — Progress Notes (Signed)
I connected with  Barbara Waller on 07/06/19 at  8:30 AM EDT by telephone and verified that I am speaking with the correct person using two identifiers.   I discussed the limitations, risks, security and privacy concerns of performing an evaluation and management service by telephone and the availability of in person appointments. I also discussed with the patient that there may be a patient responsible charge related to this service. The patient expressed understanding and agreed to proceed.  Scheryl Marten, RN 07/06/2019  8:30 AM

## 2019-07-18 NOTE — Progress Notes (Signed)
Patient called stating she was having some severe back pain and it was hard for her to move around this morning.She would like to know what she can take to help with this pain. I have advised patient to try tylenol to see if she gets any relief from her pain. She is also requesting a work note today because she had to call out. Advised patient to call the office back if tylenol does not help.

## 2019-07-20 ENCOUNTER — Encounter: Payer: Medicaid Other | Admitting: Family Medicine

## 2019-07-25 ENCOUNTER — Encounter: Payer: Self-pay | Admitting: *Deleted

## 2019-07-27 ENCOUNTER — Other Ambulatory Visit (HOSPITAL_COMMUNITY)
Admission: RE | Admit: 2019-07-27 | Discharge: 2019-07-27 | Disposition: A | Discharge status: Home / Self Care | Payer: Medicaid Other | Source: Ambulatory Visit | Attending: Advanced Practice Midwife | Admitting: Advanced Practice Midwife

## 2019-07-27 ENCOUNTER — Ambulatory Visit (INDEPENDENT_AMBULATORY_CARE_PROVIDER_SITE_OTHER): Payer: Medicaid Other | Admitting: Advanced Practice Midwife

## 2019-07-27 ENCOUNTER — Other Ambulatory Visit: Payer: Self-pay

## 2019-07-27 VITALS — BP 101/65 | HR 82 | Wt 213.0 lb

## 2019-07-27 DIAGNOSIS — Z3480 Encounter for supervision of other normal pregnancy, unspecified trimester: Secondary | ICD-10-CM

## 2019-07-27 DIAGNOSIS — Z3A36 36 weeks gestation of pregnancy: Secondary | ICD-10-CM

## 2019-07-27 DIAGNOSIS — O99210 Obesity complicating pregnancy, unspecified trimester: Secondary | ICD-10-CM

## 2019-07-27 DIAGNOSIS — E669 Obesity, unspecified: Secondary | ICD-10-CM

## 2019-07-27 DIAGNOSIS — O99213 Obesity complicating pregnancy, third trimester: Secondary | ICD-10-CM

## 2019-07-27 NOTE — Progress Notes (Signed)
   PRENATAL VISIT NOTE  Subjective:  Barbara Waller is a 34 y.o. G3P2002 at [redacted]w[redacted]d being seen today for ongoing prenatal care.  She is currently monitored for the following issues for this low-risk pregnancy and has Supervision of other normal pregnancy, antepartum; Obesity in pregnancy; Prediabetes; and Shellfish allergy on their problem list.  Patient reports no complaints.  Contractions: Irregular. Vag. Bleeding: None.  Movement: Present. Denies leaking of fluid.   The following portions of the patient's history were reviewed and updated as appropriate: allergies, current medications, past family history, past medical history, past social history, past surgical history and problem list. Problem list updated.  Objective:   Vitals:   07/27/19 0951  BP: 101/65  Pulse: 82  Weight: 213 lb (96.6 kg)    Fetal Status: Fetal Heart Rate (bpm): 137 Fundal Height: 36 cm Movement: Present  Presentation: Vertex  General:  Alert, oriented and cooperative. Patient is in no acute distress.  Skin: Skin is warm and dry. No rash noted.   Cardiovascular: Normal heart rate noted  Respiratory: Normal respiratory effort, no problems with respiration noted  Abdomen: Soft, gravid, appropriate for gestational age.  Pain/Pressure: Present     Pelvic: Cervical exam deferred        Extremities: Normal range of motion.  Edema: Trace  Mental Status: Normal mood and affect. Normal behavior. Normal judgment and thought content.   Assessment and Plan:  Pregnancy: G3P2002 at [redacted]w[redacted]d  1. Supervision of other normal pregnancy, antepartum --Routine care --Desires waterbirth. Consent signed today. Enrolled in class May 5 --Reviewed contraindications for waterbirth, typical management on L&D --Reviewed interventions for cervical ripening once term --Discussed membrane sweeping. Reviewed Cochrane review data on membrane sweeping at 39 wks and then at EDD. Reviewed risk of cramping, contractions, bleeding and  ROM.  - Culture, beta strep (group b only) - GC/Chlamydia probe amp ([place])not at [facility]  2. Obesity in pregnancy --TWG 23 lbs  Preterm labor symptoms and general obstetric precautions including but not limited to vaginal bleeding, contractions, leaking of fluid and fetal movement were reviewed in detail with the patient. Please refer to After Visit Summary for other counseling recommendations.  Return in about 1 week (around 08/03/2019) for Virtual .  Future Appointments  Date Time Provider Department Center  08/02/2019  4:00 PM [place] Bing, MD [facility]  08/09/2019  8:45 AM [place] Bing, MD [facility]    Calvert Cantor, CNM

## 2019-07-27 NOTE — Patient Instructions (Addendum)
Considering Waterbirth? Guide for patients at [facility]  Why consider waterbirth?  . Gentle birth for babies . Less pain medicine used in labor . May allow for passive descent/less pushing . May reduce perineal tears  . More mobility and instinctive maternal position changes . Increased maternal relaxation . Reduced blood pressure in labor  Is waterbirth safe? What are the risks of infection, drowning or other complications?  . Infection: o Very low risk (3.7 % for tub vs 4.8% for bed) o 7 in 8000 waterbirths with documented infection o Poorly cleaned equipment most common cause o Slightly lower group B strep transmission rate  . Drowning o Maternal:  - Very low risk   - Related to seizures or fainting o Newborn:  - Very low risk. No evidence of increased risk of respiratory problems in multiple large studies - Physiological protection from breathing under water - Avoid underwater birth if there are any fetal complications - Once baby's head is out of the water, keep it out.  . Birth complication o Some reports of cord trauma, but risk decreased by bringing baby to surface gradually o No evidence of increased risk of shoulder dystocia. Mothers can usually change positions faster in water than in a bed, possibly aiding the maneuvers to free the shoulder.  Things that would prevent you from having a waterbirth:  Premature, <37wks  Previous cesarean birth  Presence of thick meconium-stained fluid  Multiple gestation (Twins, triplets, etc.)  Uncontrolled diabetes or gestational diabetes requiring medication  Hypertension  Heavy vaginal bleeding  Non-reassuring fetal heart rate  Active infection (MRSA, etc.)  If your labor has to be induced and induction method requires continuous monitoring of the baby's heart rate  Other risks/issues identified by your obstetrical provider   Group B Streptococcus Test During Pregnancy Why am I having this  test? Routine testing, also called screening, for group B streptococcus (GBS) is recommended for all pregnant women between the 36th and 37th week of pregnancy. GBS is a type of bacteria that can be passed from mother to baby during childbirth. Screening will help guide whether or not you will need treatment during labor and delivery to prevent complications such as:  An infection in your uterus during labor.  An infection in your uterus after delivery.  A serious infection in your baby after delivery, such as pneumonia, meningitis, or sepsis. GBS screening is not often done before 36 weeks of pregnancy unless you go into labor prematurely. What happens if I have group B streptococcus? If testing shows that you have GBS, your health care provider will recommend treatment with IV antibiotics during labor and delivery. This treatment significantly decreases the risk of complications for you and your baby. If you have a planned C-section and you have GBS, you may not need to be treated with antibiotics because GBS is usually passed to babies after labor starts and your water breaks. If you are in labor or your water breaks before your C-section, it is possible for GBS to get into your uterus and be passed to your baby, so you might need treatment. Is there a chance I may not need to be tested? You may not need to be tested for GBS if:  You have a urine test that shows GBS before 36 to 37 weeks.  You had a baby with GBS infection after a previous delivery. In these cases, you will automatically be treated for GBS during labor and delivery. What is being tested?  This test is done to check if you have group B streptococcus in your vagina or rectum. What kind of sample is taken? To collect samples for this test, your health care provider will swab your vagina and rectum with a cotton swab. The sample is then sent to the lab to see if GBS is present. What happens during the test?   You will remove  your clothing from the waist down.  You will lie down on an exam table in the same position as you would for a pelvic exam.  Your health care provider will swab your vagina and rectum to collect samples for a culture test.  You will be able to go home after the test and do all your usual activities. How are the results reported? The test results are reported as positive or negative. What do the results mean?  A positive test means you are at risk for passing GBS to your baby during labor and delivery. Your health care provider will recommend that you are treated with an IV antibiotic during labor and delivery.  A negative test means you are at very low risk of passing GBS to your baby. There is still a low risk of passing GBS to your baby because sometimes test results may report that you do not have a condition when you do (false-negative result) or there is a chance that you may become infected with GBS after the test is done. You most likely will not need to be treated with an antibiotic during labor and delivery. Talk with your health care provider about what your results mean. Questions to ask your health care provider Ask your health care provider, or the department that is doing the test:  When will my results be ready?  How will I get my results?  What are my treatment options? Summary  Routine testing (screening) for group B streptococcus (GBS) is recommended for all pregnant women between the 36th and 37th week of pregnancy.  GBS is a type of bacteria that can be passed from mother to baby during childbirth.  If testing shows that you have GBS, your health care provider will recommend that you are treated with IV antibiotics during labor and delivery. This treatment almost always prevents infection in newborns. This information is not intended to replace advice given to you by your health care provider. Make sure you discuss any questions you have with your health care  provider. Document Revised: 07/22/2018 Document Reviewed: 04/28/2018 Elsevier Patient Education  2020 [place].    Cervical Ripening: May try one or both  Red Raspberry Leaf capsules:  two 300mg  or 400mg  tablets with each meal, 2-3 times a day  Potential Side Effects Of Raspberry Leaf:  Most women do not experience any side effects from drinking raspberry leaf tea. However, nausea and loose stools are possible     Evening Primrose Oil capsules: may take 1 to 3 capsules daily. May also prick one to release the oil and insert it into your vagina at night.  Some of the potential side effects:  Upset stomach  Loose stools or diarrhea  Headaches  Nausea:

## 2019-07-28 LAB — GC/CHLAMYDIA PROBE AMP (~~LOC~~) NOT AT ARMC
Chlamydia: NEGATIVE
Comment: NEGATIVE
Comment: NORMAL
Neisseria Gonorrhea: NEGATIVE

## 2019-07-30 LAB — CULTURE, BETA STREP (GROUP B ONLY): Strep Gp B Culture: POSITIVE — AB

## 2019-08-01 ENCOUNTER — Encounter: Payer: Self-pay | Admitting: Advanced Practice Midwife

## 2019-08-01 DIAGNOSIS — B951 Streptococcus, group B, as the cause of diseases classified elsewhere: Secondary | ICD-10-CM | POA: Insufficient documentation

## 2019-08-02 ENCOUNTER — Telehealth (INDEPENDENT_AMBULATORY_CARE_PROVIDER_SITE_OTHER): Payer: Medicaid Other | Admitting: Obstetrics and Gynecology

## 2019-08-02 ENCOUNTER — Other Ambulatory Visit: Payer: Self-pay

## 2019-08-02 DIAGNOSIS — Z3A37 37 weeks gestation of pregnancy: Secondary | ICD-10-CM

## 2019-08-02 DIAGNOSIS — O98813 Other maternal infectious and parasitic diseases complicating pregnancy, third trimester: Secondary | ICD-10-CM | POA: Diagnosis not present

## 2019-08-02 DIAGNOSIS — B951 Streptococcus, group B, as the cause of diseases classified elsewhere: Secondary | ICD-10-CM | POA: Diagnosis not present

## 2019-08-02 DIAGNOSIS — Z3480 Encounter for supervision of other normal pregnancy, unspecified trimester: Secondary | ICD-10-CM

## 2019-08-02 NOTE — Progress Notes (Signed)
    TELEHEALTH VIRTUAL OBSTETRICS VISIT ENCOUNTER NOTE  Clinic: [facility]  I connected with Barbara Waller on 08/02/19 at  4:00 PM EDT by telephone at home and verified that I am speaking with the correct person using two identifiers.   I discussed the limitations, risks, security and privacy concerns of performing an evaluation and management service by telephone and the availability of in person appointments. I also discussed with the patient that there may be a patient responsible charge related to this service. The patient expressed understanding and agreed to proceed.  Subjective:  Barbara Waller is a 34 y.o. G3P2002 at [redacted]w[redacted]d being followed for ongoing prenatal care.  She is currently monitored for the following issues for this low-risk pregnancy and has Supervision of other normal pregnancy, antepartum; Obesity in pregnancy; Prediabetes; Shellfish allergy; and Positive GBS test on their problem list.  Patient reports no complaints. Reports fetal movement. Denies any contractions, bleeding or leaking of fluid.   The following portions of the patient's history were reviewed and updated as appropriate: allergies, current medications, past family history, past medical history, past social history, past surgical history and problem list.   Objective:  There were no vitals filed for this visit.  Babyscripts Data Reviewed: yes  General:  Alert, oriented and cooperative.   Mental Status: Normal mood and affect perceived. Normal judgment and thought content.  Rest of physical exam deferred due to type of encounter  Assessment and Plan:  Pregnancy: G3P2002 at [redacted]w[redacted]d 1. Positive GBS test Reviewed with patient  2. Supervision of other normal pregnancy, antepartum Pt to upload updated VS into BS  Term labor symptoms and general obstetric precautions including but not limited to vaginal bleeding, contractions, leaking of fluid and fetal movement were  reviewed in detail with the patient.  I discussed the assessment and treatment plan with the patient. The patient was provided an opportunity to ask questions and all were answered. The patient agreed with the plan and demonstrated an understanding of the instructions. The patient was advised to call back or seek an in-person office evaluation/go to [facility] at [facility] for any urgent or concerning symptoms. Please refer to After Visit Summary for other counseling recommendations.   I provided 7 minutes of non-face-to-face time during this encounter. The visit was conducted via MyChart-medicine  Return in about 1 week (around 08/09/2019).  Future Appointments  Date Time Provider Department Center  08/10/2019 11:30 AM Calvert Cantor, CNM [facility]    [place] Bing, MD [facility]

## 2019-08-09 ENCOUNTER — Encounter: Payer: Medicaid Other | Admitting: Obstetrics and Gynecology

## 2019-08-10 ENCOUNTER — Other Ambulatory Visit: Payer: Self-pay

## 2019-08-10 ENCOUNTER — Ambulatory Visit (INDEPENDENT_AMBULATORY_CARE_PROVIDER_SITE_OTHER): Payer: Medicaid Other | Admitting: Advanced Practice Midwife

## 2019-08-10 VITALS — BP 121/80 | HR 78 | Wt 217.0 lb

## 2019-08-10 DIAGNOSIS — O99213 Obesity complicating pregnancy, third trimester: Secondary | ICD-10-CM

## 2019-08-10 DIAGNOSIS — O99210 Obesity complicating pregnancy, unspecified trimester: Secondary | ICD-10-CM

## 2019-08-10 DIAGNOSIS — Z3A38 38 weeks gestation of pregnancy: Secondary | ICD-10-CM

## 2019-08-10 DIAGNOSIS — E669 Obesity, unspecified: Secondary | ICD-10-CM

## 2019-08-10 DIAGNOSIS — Z3480 Encounter for supervision of other normal pregnancy, unspecified trimester: Secondary | ICD-10-CM

## 2019-08-10 NOTE — Patient Instructions (Addendum)
Thinking About Linden Dolin???  You must attend a Linden Dolin class at [facility]  Register by calling [phone] or online at [email or web address]  Bring [place] the certificate from the class  Things that would prevent you from having a waterbirth:  Premature, <37wks  Previous cesarean birth  Presence of thick meconium-stained fluid  Multiple gestation (Twins, triplets, etc.)  Uncontrolled diabetes or gestational diabetes requiring medication  Hypertension  Heavy vaginal bleeding  Non-reassuring fetal heart rate  Active infection (MRSA, etc.)  If your labor has to be induced and induction method requires continuous monitoring of the baby's heart rate  Other risks/issues identified by your obstetrical provider   [email or web address]

## 2019-08-10 NOTE — Progress Notes (Signed)
   PRENATAL VISIT NOTE  Subjective:  Barbara Waller is a 34 y.o. G3P2002 at [redacted]w[redacted]d being seen today for ongoing prenatal care.  She is currently monitored for the following issues for this low-risk pregnancy and has Supervision of other normal pregnancy, antepartum; Obesity in pregnancy; Prediabetes; Shellfish allergy; and Positive GBS test on their problem list.  Patient reports occasional contractions.  Contractions: Irregular. Vag. Bleeding: None.  Movement: Present. Denies leaking of fluid.   The following portions of the patient's history were reviewed and updated as appropriate: allergies, current medications, past family history, past medical history, past social history, past surgical history and problem list. Problem list updated.  Objective:   Vitals:   08/10/19 1141  BP: 121/80  Pulse: 78  Weight: 217 lb (98.4 kg)    Fetal Status: Fetal Heart Rate (bpm): 136 Fundal Height: 37 cm Movement: Present  Presentation: Vertex  General:  Alert, oriented and cooperative. Patient is in no acute distress.  Skin: Skin is warm and dry. No rash noted.   Cardiovascular: Normal heart rate noted  Respiratory: Normal respiratory effort, no problems with respiration noted  Abdomen: Soft, gravid, appropriate for gestational age.  Pain/Pressure: Present     Pelvic: Cervical exam performed per patient request Dilation: 1 Effacement (%): Thick Station: -3  Extremities: Normal range of motion.  Edema: Trace  Mental Status: Normal mood and affect. Normal behavior. Normal judgment and thought content.   Assessment and Plan:  Pregnancy: G3P2002 at [redacted]w[redacted]d  1. Supervision of other normal pregnancy, antepartum --Routine care --Waterbirth class 08/17/2019 --Discussed membrane sweeping next visit but schedule appointment to minimize risk of missing waterbirth class. Reviewed Cochrane review data on membrane sweeping at 39 wks and then at EDD. Reviewed risk of cramping, contractions, bleeding and  ROM. Answered patient questions.  2. Obesity in pregnancy --TWG 27 lbs, FH appropriate  Term labor symptoms and general obstetric precautions including but not limited to vaginal bleeding, contractions, leaking of fluid and fetal movement were reviewed in detail with the patient. Please refer to After Visit Summary for other counseling recommendations.  Return in about 1 week (around 08/17/2019).  Calvert Cantor, CNM

## 2019-08-16 ENCOUNTER — Encounter: Payer: Medicaid Other | Admitting: Obstetrics and Gynecology

## 2019-08-17 ENCOUNTER — Inpatient Hospital Stay (HOSPITAL_COMMUNITY)
Admission: AD | Admit: 2019-08-17 | Discharge: 2019-08-19 | DRG: 807 | Disposition: A | Discharge status: Home / Self Care | Payer: Medicaid Other | Attending: Obstetrics and Gynecology | Admitting: Obstetrics and Gynecology

## 2019-08-17 ENCOUNTER — Encounter (HOSPITAL_COMMUNITY): Payer: Self-pay | Admitting: Obstetrics & Gynecology

## 2019-08-17 ENCOUNTER — Other Ambulatory Visit: Payer: Self-pay

## 2019-08-17 ENCOUNTER — Inpatient Hospital Stay (HOSPITAL_COMMUNITY): Payer: Medicaid Other | Admitting: Anesthesiology

## 2019-08-17 DIAGNOSIS — E669 Obesity, unspecified: Secondary | ICD-10-CM | POA: Diagnosis present

## 2019-08-17 DIAGNOSIS — Z20822 Contact with and (suspected) exposure to covid-19: Secondary | ICD-10-CM | POA: Diagnosis present

## 2019-08-17 DIAGNOSIS — Z3A39 39 weeks gestation of pregnancy: Secondary | ICD-10-CM

## 2019-08-17 DIAGNOSIS — B951 Streptococcus, group B, as the cause of diseases classified elsewhere: Secondary | ICD-10-CM | POA: Diagnosis present

## 2019-08-17 DIAGNOSIS — O99210 Obesity complicating pregnancy, unspecified trimester: Secondary | ICD-10-CM | POA: Diagnosis present

## 2019-08-17 DIAGNOSIS — O99824 Streptococcus B carrier state complicating childbirth: Secondary | ICD-10-CM | POA: Diagnosis present

## 2019-08-17 DIAGNOSIS — R7303 Prediabetes: Secondary | ICD-10-CM | POA: Diagnosis present

## 2019-08-17 DIAGNOSIS — O26893 Other specified pregnancy related conditions, third trimester: Secondary | ICD-10-CM | POA: Diagnosis present

## 2019-08-17 DIAGNOSIS — O99214 Obesity complicating childbirth: Secondary | ICD-10-CM | POA: Diagnosis present

## 2019-08-17 DIAGNOSIS — Z3480 Encounter for supervision of other normal pregnancy, unspecified trimester: Secondary | ICD-10-CM

## 2019-08-17 LAB — CBC
HCT: 37 % (ref 36.0–46.0)
Hemoglobin: 11.8 g/dL — ABNORMAL LOW (ref 12.0–15.0)
MCH: 25.8 pg — ABNORMAL LOW (ref 26.0–34.0)
MCHC: 31.9 g/dL (ref 30.0–36.0)
MCV: 81 fL (ref 80.0–100.0)
Platelets: 281 10*3/uL (ref 150–400)
RBC: 4.57 MIL/uL (ref 3.87–5.11)
RDW: 15.4 % (ref 11.5–15.5)
WBC: 8.6 10*3/uL (ref 4.0–10.5)
nRBC: 0 % (ref 0.0–0.2)

## 2019-08-17 LAB — TYPE AND SCREEN
ABO/RH(D): O POS
Antibody Screen: NEGATIVE

## 2019-08-17 LAB — POCT FERN TEST: POCT Fern Test: POSITIVE

## 2019-08-17 LAB — RESPIRATORY PANEL BY RT PCR (FLU A&B, COVID)
Influenza A by PCR: NEGATIVE
Influenza B by PCR: NEGATIVE
SARS Coronavirus 2 by RT PCR: NEGATIVE

## 2019-08-17 LAB — RPR: RPR Ser Ql: NONREACTIVE

## 2019-08-17 LAB — ABO/RH: ABO/RH(D): O POS

## 2019-08-17 MED ORDER — LACTATED RINGERS IV SOLN: INTRAVENOUS | Status: DC

## 2019-08-17 MED ORDER — FENTANYL CITRATE (PF) 100 MCG/2ML IJ SOLN: 100.0000 ug | INTRAMUSCULAR | Status: DC | PRN

## 2019-08-17 MED ORDER — DIBUCAINE (PERIANAL) 1 % EX OINT: 1.0000 "application " | TOPICAL_OINTMENT | CUTANEOUS | Status: DC | PRN

## 2019-08-17 MED ORDER — SODIUM CHLORIDE 0.9 % IV SOLN
5.0000 10*6.[IU] | Freq: Once | INTRAVENOUS | Status: AC
  Administered 2019-08-17: 5 10*6.[IU] via INTRAVENOUS
  Filled 2019-08-17: qty 5

## 2019-08-17 MED ORDER — OXYCODONE-ACETAMINOPHEN 5-325 MG PO TABS: 2.0000 | ORAL_TABLET | ORAL | Status: DC | PRN

## 2019-08-17 MED ORDER — SOD CITRATE-CITRIC ACID 500-334 MG/5ML PO SOLN: 30.0000 mL | ORAL | Status: DC | PRN

## 2019-08-17 MED ORDER — LACTATED RINGERS IV SOLN: 500.0000 mL | Freq: Once | INTRAVENOUS | Status: DC

## 2019-08-17 MED ORDER — OXYTOCIN BOLUS FROM INFUSION
500.0000 mL | Freq: Once | INTRAVENOUS | Status: AC
  Administered 2019-08-17: 500 mL via INTRAVENOUS

## 2019-08-17 MED ORDER — WITCH HAZEL-GLYCERIN EX PADS: 1.0000 "application " | MEDICATED_PAD | CUTANEOUS | Status: DC | PRN

## 2019-08-17 MED ORDER — IBUPROFEN 600 MG PO TABS
600.0000 mg | ORAL_TABLET | Freq: Three times a day (TID) | ORAL | Status: DC | PRN
  Administered 2019-08-17 – 2019-08-19 (×4): 600 mg via ORAL
  Filled 2019-08-17 (×4): qty 1

## 2019-08-17 MED ORDER — COCONUT OIL OIL
1.0000 "application " | TOPICAL_OIL | Status: DC | PRN
  Administered 2019-08-17: 1 via TOPICAL

## 2019-08-17 MED ORDER — MEASLES, MUMPS & RUBELLA VAC IJ SOLR: 0.5000 mL | Freq: Once | INTRAMUSCULAR | Status: DC

## 2019-08-17 MED ORDER — LIDOCAINE HCL (PF) 1 % IJ SOLN
30.0000 mL | INTRAMUSCULAR | Status: AC | PRN
  Administered 2019-08-17: 10 mL via SUBCUTANEOUS

## 2019-08-17 MED ORDER — FENTANYL-BUPIVACAINE-NACL 0.5-0.125-0.9 MG/250ML-% EP SOLN
EPIDURAL | Status: AC
  Filled 2019-08-17: qty 250

## 2019-08-17 MED ORDER — OXYCODONE-ACETAMINOPHEN 5-325 MG PO TABS: 1.0000 | ORAL_TABLET | ORAL | Status: DC | PRN

## 2019-08-17 MED ORDER — LACTATED RINGERS IV SOLN: 500.0000 mL | INTRAVENOUS | Status: DC | PRN

## 2019-08-17 MED ORDER — OXYTOCIN 40 UNITS IN NORMAL SALINE INFUSION - SIMPLE MED
2.5000 [IU]/h | INTRAVENOUS | Status: DC
  Filled 2019-08-17: qty 1000

## 2019-08-17 MED ORDER — PENICILLIN G POT IN DEXTROSE 60000 UNIT/ML IV SOLN
3.0000 10*6.[IU] | INTRAVENOUS | Status: DC
  Administered 2019-08-17: 3 10*6.[IU] via INTRAVENOUS
  Filled 2019-08-17: qty 50

## 2019-08-17 MED ORDER — PHENYLEPHRINE 40 MCG/ML (10ML) SYRINGE FOR IV PUSH (FOR BLOOD PRESSURE SUPPORT)
PREFILLED_SYRINGE | INTRAVENOUS | Status: AC
  Filled 2019-08-17: qty 10

## 2019-08-17 MED ORDER — PRENATAL MULTIVITAMIN CH
1.0000 | ORAL_TABLET | Freq: Every day | ORAL | Status: DC
  Administered 2019-08-18 – 2019-08-19 (×2): 1 via ORAL
  Filled 2019-08-17 (×2): qty 1

## 2019-08-17 MED ORDER — ONDANSETRON HCL 4 MG PO TABS: 4.0000 mg | ORAL_TABLET | ORAL | Status: DC | PRN

## 2019-08-17 MED ORDER — ONDANSETRON HCL 4 MG/2ML IJ SOLN: 4.0000 mg | INTRAMUSCULAR | Status: DC | PRN

## 2019-08-17 MED ORDER — FENTANYL-BUPIVACAINE-NACL 0.5-0.125-0.9 MG/250ML-% EP SOLN: 12.0000 mL/h | EPIDURAL | Status: DC | PRN

## 2019-08-17 MED ORDER — ONDANSETRON HCL 4 MG/2ML IJ SOLN
4.0000 mg | Freq: Four times a day (QID) | INTRAMUSCULAR | Status: DC | PRN
  Administered 2019-08-17: 4 mg via INTRAVENOUS
  Filled 2019-08-17: qty 2

## 2019-08-17 MED ORDER — ACETAMINOPHEN 325 MG PO TABS
650.0000 mg | ORAL_TABLET | Freq: Four times a day (QID) | ORAL | Status: DC | PRN
  Administered 2019-08-17 – 2019-08-18 (×2): 650 mg via ORAL
  Filled 2019-08-17 (×2): qty 2

## 2019-08-17 MED ORDER — BENZOCAINE-MENTHOL 20-0.5 % EX AERO: 1.0000 "application " | INHALATION_SPRAY | CUTANEOUS | Status: DC | PRN

## 2019-08-17 MED ORDER — DIPHENHYDRAMINE HCL 50 MG/ML IJ SOLN: 12.5000 mg | INTRAMUSCULAR | Status: DC | PRN

## 2019-08-17 MED ORDER — SIMETHICONE 80 MG PO CHEW: 80.0000 mg | CHEWABLE_TABLET | ORAL | Status: DC | PRN

## 2019-08-17 MED ORDER — EPHEDRINE 5 MG/ML INJ: 10.0000 mg | INTRAVENOUS | Status: DC | PRN

## 2019-08-17 MED ORDER — TETANUS-DIPHTH-ACELL PERTUSSIS 5-2.5-18.5 LF-MCG/0.5 IM SUSP: 0.5000 mL | Freq: Once | INTRAMUSCULAR | Status: DC

## 2019-08-17 MED ORDER — SODIUM CHLORIDE (PF) 0.9 % IJ SOLN
INTRAMUSCULAR | Status: DC | PRN
  Administered 2019-08-17: 12 mL/h via EPIDURAL

## 2019-08-17 MED ORDER — DIPHENHYDRAMINE HCL 25 MG PO CAPS: 25.0000 mg | ORAL_CAPSULE | Freq: Four times a day (QID) | ORAL | Status: DC | PRN

## 2019-08-17 MED ORDER — PHENYLEPHRINE 40 MCG/ML (10ML) SYRINGE FOR IV PUSH (FOR BLOOD PRESSURE SUPPORT): 80.0000 ug | PREFILLED_SYRINGE | INTRAVENOUS | Status: DC | PRN

## 2019-08-17 MED ORDER — SENNOSIDES-DOCUSATE SODIUM 8.6-50 MG PO TABS
2.0000 | ORAL_TABLET | ORAL | Status: DC
  Administered 2019-08-17 – 2019-08-18 (×2): 2 via ORAL
  Filled 2019-08-17 (×2): qty 2

## 2019-08-17 MED ORDER — ACETAMINOPHEN 325 MG PO TABS: 650.0000 mg | ORAL_TABLET | ORAL | Status: DC | PRN

## 2019-08-17 NOTE — Anesthesia Procedure Notes (Signed)
Epidural Patient location during procedure: OB Start time: 08/17/2019 7:05 AM End time: 08/17/2019 7:20 AM  Staffing Anesthesiologist: Lucretia Kern, MD Performed: anesthesiologist   Preanesthetic Checklist Completed: patient identified, IV checked, risks and benefits discussed, monitors and equipment checked, pre-op evaluation and timeout performed  Epidural Patient position: sitting Prep: DuraPrep Patient monitoring: heart rate, continuous pulse ox and blood pressure Approach: midline Location: L3-L4 Injection technique: LOR air  Needle:  Needle type: Tuohy  Needle gauge: 17 G Needle length: 9 cm Needle insertion depth: 9 cm Catheter type: closed end flexible Catheter size: 19 Gauge Catheter at skin depth: 14 cm Test dose: negative  Assessment Events: blood not aspirated, injection not painful, no injection resistance, no paresthesia and negative IV test  Additional Notes Reason for block:procedure for pain

## 2019-08-17 NOTE — Discharge Summary (Signed)
Postpartum Discharge Summary    Patient Name: Barbara Waller DOB: 1985-04-18 MRN: [id number]  Date of admission: 08/17/2019 Delivering Provider: Alroy Bailiff   Date of discharge: 08/19/2019  Admitting diagnosis: Indication for care in labor and delivery, antepartum [O75.9] Intrauterine pregnancy: [redacted]w[redacted]d    Secondary diagnosis:  Active Problems:   Supervision of other normal pregnancy, antepartum   Obesity in pregnancy   Prediabetes   Positive GBS test   Indication for care in labor and delivery, antepartum  Additional problems: None     Discharge diagnosis: Term Pregnancy Delivered                                                                                                Post partum procedures:None  Augmentation: None  Complications: None  Hospital course:  Onset of Labor With Vaginal Delivery     34y.o. yo G3P2002 at 36w2das admitted in Latent Labor on 08/17/2019. Patient had an uncomplicated labor course as follows: Initial SVE: 3cm/80%/-2 after SROM. She received adequate GBS abx. She then progressed to complete.  Membrane Rupture Time/Date: 1:15 AM ,08/17/2019   Intrapartum Procedures: Episiotomy: None [1]                                         Lacerations:  None [1]  Patient had a delivery of a Viable infant. 08/17/2019  Information for the patient's newborn:  StCilicia, Borden0[[id number]]Delivery Method: Vag-Spont     Pateint had an uncomplicated postpartum course.  She is ambulating, tolerating a regular diet, passing flatus, and urinating well. Patient is discharged home in stable condition on 08/19/19.  Delivery time: 11:51 AM    Magnesium Sulfate received: No BMZ received: No Rhophylac:No MMR:No Transfusion:No  Physical exam  Vitals:   08/18/19 0500 08/18/19 1435 08/18/19 2138 08/19/19 0531  BP: 107/80 100/74 113/72 108/77  Pulse: 69 62 68 64  Resp: '20  16 18  ' Temp: 97.8 F (36.6 C) 98.2 F (36.8 C) 98.3 F (36.8 C) 98.2 F (36.8  C)  TempSrc: Oral Oral Oral Tympanic  SpO2: 100%  100% 99%  Weight:      Height:       General: alert, cooperative and no distress Lochia: appropriate Uterine Fundus: firm Incision: N/A DVT Evaluation: No significant calf/ankle edema. Labs: Lab Results  Component Value Date   WBC 8.6 08/17/2019   HGB 11.8 (L) 08/17/2019   HCT 37.0 08/17/2019   MCV 81.0 08/17/2019   PLT 281 08/17/2019   CMP Latest Ref Rng & Units 02/16/2019  Glucose 65 - 99 mg/dL 87  BUN 6 - 20 mg/dL 7  Creatinine 0.57 - 1.00 mg/dL 0.70  Sodium 134 - 144 mmol/L 138  Potassium 3.5 - 5.2 mmol/L 3.4(L)  Chloride 96 - 106 mmol/L 103  CO2 20 - 29 mmol/L 21  Calcium 8.7 - 10.2 mg/dL 8.9  Total Protein 6.0 - 8.5 g/dL 7.1  Total Bilirubin 0.0 - 1.2 mg/dL 0.3  Alkaline Phos  39 - 117 IU/L 67  AST 0 - 40 IU/L 21  ALT 0 - 32 IU/L 28   Edinburgh Score: Edinburgh Postnatal Depression Scale Screening Tool 08/17/2019  I have been able to laugh and see the funny side of things. 0  I have looked forward with enjoyment to things. 0  I have blamed myself unnecessarily when things went wrong. 0  I have been anxious or worried for no good reason. 0  I have felt scared or panicky for no good reason. 0  Things have been getting on top of me. 0  I have been so unhappy that I have had difficulty sleeping. 0  I have felt sad or miserable. 0  I have been so unhappy that I have been crying. 0  The thought of harming myself has occurred to me. 0  Edinburgh Postnatal Depression Scale Total 0    Discharge instruction: per After Visit Summary and "Baby and Me Booklet".  After visit meds:  Allergies as of 08/19/2019      Reactions   Ferrous Sulfate Swelling   Shellfish Allergy Swelling      Medication List    TAKE these medications   acetaminophen 325 MG tablet Commonly known as: Tylenol Take 2 tablets (650 mg total) by mouth every 6 (six) hours as needed (for pain scale < 4). What changed:   medication strength  how  much to take  when to take this  reasons to take this  Another medication with the same name was removed. Continue taking this medication, and follow the directions you see here.   Blood Pressure Monitoring Devi Check blood pressure 1-2 times per week and make sure to document in baby scripts.   Comfort Fit Maternity Supp Lg Misc 1 Device by Does not apply route daily as needed.   famotidine 20 MG tablet Commonly known as: PEPCID Take 1 tablet (20 mg total) by mouth 2 (two) times daily. What changed:   when to take this  reasons to take this   ibuprofen 600 MG tablet Commonly known as: ADVIL Take 1 tablet (600 mg total) by mouth every 8 (eight) hours as needed for mild pain.   norethindrone 0.35 MG tablet Commonly known as: Ortho Micronor Take 1 tablet (0.35 mg total) by mouth daily.   polyethylene glycol powder 17 GM/SCOOP powder Commonly known as: GLYCOLAX/MIRALAX Take 17 g by mouth daily as needed.   prenatal multivitamin Tabs tablet Take 1 tablet by mouth daily at 12 noon.   sodium chloride 0.65 % Soln nasal spray Commonly known as: OCEAN Place 1 spray into both nostrils as needed for congestion.       Diet: routine diet  Activity: Advance as tolerated. Pelvic rest for 6 weeks.   Outpatient follow up:4 weeks Follow up Appt: Future Appointments  Date Time Provider [place]  09/14/2019 10:30 AM Caren Macadam, MD [facility]   Follow up Visit:    Please schedule this patient for Postpartum visit in: 4 weeks with the following provider: Any provider Virtual For C/S patients schedule nurse incision check in weeks 2 weeks: no Low risk pregnancy complicated by: none Delivery mode:  SVD Anticipated Birth Control:  POPs PP Procedures needed: none  Schedule Integrated BH visit: no     Newborn Data: Live born female  Birth Weight: 3116 grams  APGAR: 9, 9  Newborn Delivery   Birth date/time: 08/17/2019 11:51:00 Delivery type:  Vaginal, Spontaneous      Baby Feeding:  Breast Disposition:home with mother   08/19/2019 Clarnce Flock, MD

## 2019-08-17 NOTE — Anesthesia Postprocedure Evaluation (Signed)
Anesthesia Post Note  Patient: Barbara Waller  Procedure(s) Performed: AN AD HOC LABOR EPIDURAL     Patient location during evaluation: Mother Baby Anesthesia Type: Epidural Level of consciousness: awake and alert and oriented Pain management: satisfactory to patient Vital Signs Assessment: post-procedure vital signs reviewed and stable Respiratory status: respiratory function stable Cardiovascular status: stable Postop Assessment: no headache, no backache, epidural receding, patient able to bend at knees, no signs of nausea or vomiting, adequate PO intake, no apparent nausea or vomiting and able to ambulate Anesthetic complications: no    Last Vitals:  Vitals:   08/17/19 1340 08/17/19 1454  BP: 112/68 112/72  Pulse: 74 70  Resp: 18 18  Temp: 36.9 C 36.8 C  SpO2: 100% 100%    Last Pain:  Vitals:   08/17/19 1454  TempSrc: Oral  PainSc: 0-No pain   Pain Goal:                   Delanna Blacketer

## 2019-08-17 NOTE — H&P (Addendum)
OBSTETRIC ADMISSION HISTORY AND PHYSICAL  Barbara Waller is a 34 y.o. female G3P2002 with IUP at [redacted]w[redacted]d by LMP presenting for SROM. She reports +FMs, no VB, no blurry vision, headaches or peripheral edema, and RUQ pain.  She plans on breast feeding. She request POPs for birth control. She received her prenatal care at [facility]    Dating: By LMP --->  Estimated Date of Delivery: 08/22/19  Sono:    @[redacted]w[redacted]d , CWD, normal anatomy, variable presentation, EFW: 600 gm (1 lb 5 oz),  67%lie  Prenatal History/Complications:  AMA Low lying placenta  Prediabetes   Past Medical History: Past Medical History:  Diagnosis Date  . Anemia 08/04/2017  . BMI 32.0-32.9,adult 06/26/2015  . Breast cyst, left 10/02/2018  . Breast lump on left side at 6 o'clock position 10/21/2018  . Breast pain, left 10/02/2018  . Chronic midline low back pain without sciatica 05/16/2016  . Flu-like symptoms 05/25/2018  . Influenza B 05/25/2018  . Left breast abscess 06/26/2015  . 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 Obesity   . Sore throat 05/25/2018  . Vaginal Pap smear, abnormal   . Vitamin D deficiency 08/04/2017    Past Surgical History: Past Surgical History:  Procedure Laterality Date  . axilla abscess    . COLOSTOMY      Obstetrical History: OB History    Gravida  3   Para  2   Term  2   Preterm      AB      Living  2     SAB      TAB      Ectopic      Multiple  0   Live Births  2           Social History Social History   Socioeconomic History  . Marital status: Divorced    Spouse name: Not on file  . Number of children: 2  . Years of education: Not on file  . Highest education level: Some college, no degree  Occupational History  . Not on file  Tobacco Use  . Smoking status: Never Smoker  . Smokeless tobacco: Never Used  Substance and Sexual Activity  . Alcohol use: No  . Drug use: No  . Sexual activity: Yes     Partners: Male  Other Topics Concern  . Not on file  Social History Narrative  . Not on file   Social Determinants of Health   Financial Resource Strain:   . Difficulty of Paying Living Expenses:   Food Insecurity:   . Worried About 08/06/2017 in the Last Year:   . Programme researcher, broadcasting/film/video in the Last Year:   Transportation Needs: No Transportation Needs  . Lack of Transportation (Medical): No  . Lack of Transportation (Non-Medical): No  Physical Activity:   . Days of Exercise per Week:   . Minutes of Exercise per Session:   Stress:   . Feeling of Stress :   Social Connections:   . Frequency of Communication with Friends and Family:   . Frequency of Social Gatherings with Friends and Family:   . Attends Religious Services:   . Active Member of Clubs or Organizations:   . Attends Barista Meetings:   Banker Marital Status:     Family History: Family History  Problem Relation Age of Onset  . Diabetes Mother   . Hypertension Mother   .  Diabetes Maternal Grandmother   . Diabetes Father   . Heart failure Father   . Anesthesia problems Neg Hx     Allergies: Allergies  Allergen Reactions  . Ferrous Sulfate Swelling  . Shellfish Allergy Swelling    Medications Prior to Admission  Medication Sig Dispense Refill Last Dose  . Acetaminophen (TYLENOL PO) Take by mouth.   Past Week at Unknown time  . famotidine (PEPCID) 20 MG tablet Take 1 tablet (20 mg total) by mouth 2 (two) times daily. 60 tablet 3 Past Week at Unknown time  . Prenatal Vit-Fe Fumarate-FA (PRENATAL MULTIVITAMIN) TABS tablet Take 1 tablet by mouth daily at 12 noon.   08/16/2019 at Unknown time  . Biotin 1000 MCG CHEW Chew by mouth.   More than a month at Unknown time  . Blood Pressure Monitoring DEVI Check blood pressure 1-2 times per week and make sure to document in baby scripts. 1 Product 0   . Elastic Bandages & Supports (COMFORT FIT MATERNITY SUPP LG) MISC 1 Device by Does not apply route daily as  needed. (Patient not taking: Reported on 06/22/2019) 1 each 0   . Omega-3 Fatty Acids (FISH OIL) 1000 MG CAPS Take by mouth.   More than a month at Unknown time     Review of Systems   All systems reviewed and negative except as stated in HPI  Blood pressure 104/75, pulse 80, temperature 98.9 F (37.2 C), resp. rate 20, height 5\' 6"  (1.676 m), weight 97.1 kg, last menstrual period 11/15/2018. General appearance: alert and no distress Lungs: clear to auscultation bilaterally Heart: regular rate and rhythm Abdomen: soft, non-tender; bowel sounds normal Pelvic: gravid uterus, leopolds 2700g Extremities: Homans sign is negative, no sign of DVT Presentation: cephalic, per RN exam  Fetal monitoringBaseline: 145 bpm, Variability: Good {> 6 bpm), Accelerations: pos accels  and Decelerations: Absent Uterine activityFrequency: Every 1-2 minutes Dilation: 3 Effacement (%): 80 Station: -2 Exam by:: DANIELLE, RN   Prenatal labs: ABO, Rh: O/Positive/-- (11/04 0947) Antibody: Negative (11/04 0947) Rubella: 1.27 (11/04 0947) RPR: Non Reactive (02/11 0834)  HBsAg: Negative (11/04 0947)  HIV: Non Reactive (02/11 0832)  GBS: Positive/-- (04/14 1011)  2 hr Glucola (82, 137, 92) Genetic screening  Low risk NIPS Anatomy US normal  Prenatal Transfer Tool  Maternal Diabetes: No Genetic Screening: Normal Maternal Ultrasounds/Referrals: Normal Fetal Ultrasounds or other Referrals:  None Maternal Substance Abuse:  No Significant Maternal Medications:  None Significant Maternal Lab Results: Group B Strep positive  Results for orders placed or performed during the hospital encounter of 08/17/19 (from the past 24 hour(s))  CBC   Collection Time: 08/17/19  2:34 AM  Result Value Ref Range   WBC 8.6 4.0 - 10.5 K/uL   RBC 4.57 3.87 - 5.11 MIL/uL   Hemoglobin 11.8 (L) 12.0 - 15.0 g/dL   HCT 37.0 36.0 - 46.0 %   MCV 81.0 80.0 - 100.0 fL   MCH 25.8 (L) 26.0 - 34.0 pg   MCHC 31.9 30.0 - 36.0 g/dL    RDW 15.4 11.5 - 15.5 %   Platelets 281 150 - 400 K/uL   nRBC 0.0 0.0 - 0.2 %  Fern Test   Collection Time: 08/17/19  2:54 AM  Result Value Ref Range   POCT Fern Test Positive = ruptured amniotic membanes     Patient Active Problem List   Diagnosis Date Noted  . Indication for care in labor and delivery, antepartum 08/17/2019  . Positive GBS  test 08/01/2019  . 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 Prediabetes 01/03/2016  . Obesity in pregnancy 04/11/2014  . Supervision of other normal pregnancy, antepartum 01/16/2014    Assessment/Plan:  Barbara Waller is a 34 y.o. G3P2002 at [redacted]w[redacted]d here for SOL. Patient had SROM ~0115 today and is feeling CTX.   #Labor: Admit to L&D.  Expectant management until completes adequate  treatment for GBS prophylaxis. #Pain: Per pt request, plans epidural #FWB: Cat 1  #ID:  GBS pos, PCN  #MOF: breast #MOC:POPs #Circ:  Yes   Katha Cabal, DO  08/17/2019, 3:14 AM    OB FELLOW ATTESTATION  I have seen and examined this patient and edited the above documentation in the resident's note to reflect any changes or updates.  Zack Seal, MD/[facility] OB Fellow  08/17/2019, 3:36 AM

## 2019-08-17 NOTE — MAU Note (Signed)
PT SAYS AT 0115 SROM - ( UC AT 7 PM - )   HAD GUSH ON SOFA THEN WENT TO B-ROOM.  SMALL AMT COMING OUT NOW.  PNC WITH  [facility].  VE IN OFFIC ELAST WEEK - 1 CM . NO WATER BIRTH - DID NOT GET CLASS .  DENIES HSV AND MRSA.  GBS- POSITIVE

## 2019-08-17 NOTE — Anesthesia Preprocedure Evaluation (Signed)

## 2019-08-18 ENCOUNTER — Encounter: Payer: Medicaid Other | Admitting: Obstetrics & Gynecology

## 2019-08-18 NOTE — Lactation Note (Signed)
This note was copied from a baby's chart. Lactation Consultation Note  Patient Name: Barbara Waller OITGP'Q Date: 08/18/2019 Reason for consult: Initial assessment;Term P3, 18 hour term female infant. LC entered room, mom was holding infant doing STS. LC unable assist with latch at this time, mom had given infant 10 mls of formula prior to [facility] entering the room. Per mom, infant is not latching well at breast , infant has a shallow latch and tends to hand at the tip of her nipple. LC discussed tips for deeper latch, using the football hold position, hand expressing small amount of colostrum prior to latching infant at breast, bring infant chin first for latch. Mom knows to ask RN or LC for assistance with latching infant at breast if needed. Mom will latch infant at breast according to hunger cues, 8 to 12+ times within 24 hours and on demand, mom will not exceed 3 hours without feeding infant. Reviewed Baby & Me book's Breastfeeding Basics.  Mom made aware of O/P services, breastfeeding support groups, community resources, and our phone # for post-discharge questions.   Maternal Data Formula Feeding for Exclusion: No Has patient been taught Hand Expression?: Yes  Feeding Feeding Type: Formula  LATCH Score                   Interventions Interventions: Hand express;Pre-pump if needed;Coconut oil;Skin to skin;Breast feeding basics reviewed  Lactation Tools Discussed/Used WIC Program: Yes   Consult Status Consult Status: Follow-up Date: 08/18/19 Follow-up type: In-patient    Danelle Earthly 08/18/2019, 6:19 AM

## 2019-08-18 NOTE — Progress Notes (Signed)
POSTPARTUM PROGRESS NOTE  Post Partum Day 1  Subjective:  Barbara Waller is a 34 y.o. G3P3003 s/p NSVD at [redacted]w[redacted]d.  She reports she is doing well. No acute events overnight. She denies any problems with ambulating, voiding or po intake. Denies nausea or vomiting.  Pain is well controlled.  Lochia is less than a period.  Objective: Blood pressure 107/80, pulse 69, temperature 97.8 F (36.6 C), temperature source Oral, resp. rate 20, height 5\' 6"  (1.676 m), weight 97.1 kg, last menstrual period 11/15/2018, SpO2 100 %, unknown if currently breastfeeding.  Physical Exam:  General: alert, cooperative and no distress Chest: no respiratory distress Heart:regular rate, distal pulses intact Abdomen: soft, nontender,  Uterine Fundus: firm, appropriately tender DVT Evaluation: No calf swelling or tenderness Extremities: no LE edema Skin: warm, dry  Recent Labs    08/17/19 0234  HGB 11.8*  HCT 37.0    Assessment/Plan: Barbara Waller is a 34 y.o. [phone] s/p NSVD at [redacted]w[redacted]d   PPD#1 - Doing well  Routine postpartum care Contraception: POPs Feeding: breast Dispo: Plan for discharge PPD#1-2 (pending baby).   LOS: 1 day   [redacted]w[redacted]d, MD/[facility] OB Fellow  08/18/2019, 7:49 AM

## 2019-08-18 NOTE — Lactation Note (Signed)
This note was copied from a baby's chart. Lactation Consultation Note  Patient Name: Barbara Waller OVANV'B Date: 08/18/2019  P3, 13 hour term female infant. LC entered room mom and infant asleep dad sitting in chair. LC introduced herself and informed dad LC services would come back later.   Maternal Data    Feeding Feeding Type: Breast Fed  LATCH Score                   Interventions    Lactation Tools Discussed/Used     Consult Status      Barbara Waller 08/18/2019, 1:08 AM

## 2019-08-18 NOTE — Lactation Note (Signed)
This note was copied from a baby's chart. Lactation Consultation Note  Patient Name: Barbara Waller STMHD'Q Date: 08/18/2019 Reason for consult: Follow-up assessment;Difficult latch;Nipple pain/trauma;Term   LC in to see P3 Mom of term baby at 69 hrs old. Baby at 3% weight loss with 4 voids and 1 stool last 24 hrs. RN called as baby was clicking when latched to breast.   Mom assisted with positioning baby in football hold on left breast.  Breasts are large with compressible areola and erect nipples.  Baby opens widely, but when brought to breast, he quickly closes his mouth.  Adjusted gape of latch by pulling on baby's chin.  Baby continually losing suction creating a popping sound on the breast.  Repeatedly tried to relatch baby deeper.  Initiated a 20 mm nipple shield with not any improvement.  Mom complaining of significant pain with latch.  Oral assessment.  Baby with a tight grip to finger.  Tongue extends and cups partially while back of tongue unable to create a seal causing popping sound.  Palate appears normal.  Unable to identify a tight frenulum.  Mom stated her 2nd child had a "tongue tie" and she pumped and bottle fed as he couldn't latch.  Baby noted to have difficulty with slow flow bottle nipple, milk leaking out of side of his mouth.  Switched to extra slow flow, and baby took 5 ml before falling asleep.  Recommended SLP consult prior to discharge, RN aware and will speak to baby's MD.  Talked about importance of feeding baby and supporting milk supply.  Mom has a DEBP at home, but doesn't know the type.   RN set up DEBP earlier and will assist with first pumping.  [facility] referral sent for [facility] loaner pump, explaining to Mom that the [employer] DEBP was the best pump.  Mom states she will pump after eating and her nap.  Mom knows to call out when she plans to pump to assist/assess with flange size and pumping routine.  Plan- 1-Keep baby STS as much as possible 2- Offer  breast with cues, if baby unable to attain a comfortable and deep latch, Mom to supplement and pump to support her milk supply 3-Follow-up with OP lactation after discharge.   Feeding Feeding Type: Formula Nipple Type: Extra Slow Flow  LATCH Score Latch: Repeated attempts needed to sustain latch, nipple held in mouth throughout feeding, stimulation needed to elicit sucking reflex.  Audible Swallowing: None  Type of Nipple: Everted at rest and after stimulation  Comfort (Breast/Nipple): Engorged, cracked, bleeding, large blisters, severe discomfort  Hold (Positioning): Full assist, staff holds infant at breast  LATCH Score: 3  Interventions Interventions: Breast feeding basics reviewed;Assisted with latch;Skin to skin;Breast massage;Hand express;Breast compression;Adjust position;Support pillows;Position options;Expressed milk;DEBP  Lactation Tools Discussed/Used Tools: Pump;Nipple Shields Nipple shield size: 20 Breast pump type: Double-Electric Breast Pump Pump Review: Setup, frequency, and cleaning;Milk Storage Initiated by:: RN Date initiated:: 08/18/19   Consult Status Consult Status: Follow-up Date: 08/19/19 Follow-up type: In-patient    Judee Clara 08/18/2019, 12:10 PM

## 2019-08-19 MED ORDER — NORETHINDRONE 0.35 MG PO TABS
1.0000 | ORAL_TABLET | Freq: Every day | ORAL | 11 refills | Status: DC
Start: 2019-08-19 — End: 2020-08-23

## 2019-08-19 MED ORDER — POLYETHYLENE GLYCOL 3350 17 GM/SCOOP PO POWD
17.0000 g | Freq: Every day | ORAL | 1 refills | Status: DC | PRN
Start: 2019-08-19 — End: 2020-09-13

## 2019-08-19 MED ORDER — IBUPROFEN 600 MG PO TABS: 600.0000 mg | ORAL_TABLET | Freq: Three times a day (TID) | ORAL | 0 refills | Status: DC | PRN

## 2019-08-19 MED ORDER — ACETAMINOPHEN 325 MG PO TABS: 650.0000 mg | ORAL_TABLET | Freq: Four times a day (QID) | ORAL | 0 refills | Status: DC | PRN

## 2019-08-19 MED FILL — NORETHINDRONE 0.35 MG TABS: 0.35 | 28 days supply | Qty: 28 | Fill #0

## 2019-08-19 MED FILL — IBUPROFEN 600 MG TABLET: 600 | 30 days supply | Qty: 30 | Fill #0

## 2019-08-19 MED FILL — POLYETHYLENE GLYCOL 3350 PO: 17 | 28 days supply | Qty: 476 | Fill #0

## 2019-08-19 NOTE — Discharge Instructions (Signed)

## 2019-08-20 ENCOUNTER — Other Ambulatory Visit: Payer: Self-pay

## 2019-08-20 ENCOUNTER — Inpatient Hospital Stay (HOSPITAL_COMMUNITY)
Admission: EM | Admit: 2019-08-20 | Discharge: 2019-08-21 | Disposition: A | Discharge status: Home / Self Care | Payer: Medicaid Other | Attending: Obstetrics and Gynecology | Admitting: Obstetrics and Gynecology

## 2019-08-20 DIAGNOSIS — O9279 Other disorders of lactation: Secondary | ICD-10-CM | POA: Insufficient documentation

## 2019-08-20 NOTE — MAU Note (Signed)
Pt reports to MAU from [facility] reporting pain in her breast that is a 7/10. Pt states she has been trying to breastfeed and pump with no relief. Pt reports her bleeding is normal and it has lightened. Pt reports some LRQ pain that is a 6/10 that she states she is unsure if she over did it at home working around the house or what but this pain started last night.pt states she only feels it if she is moving or bending over the site. Pt states it feels like a pulled muscle.   106/78 76 98.2

## 2019-08-20 NOTE — ED Triage Notes (Signed)
Called MAU, MAU will see patient.  Transport called.

## 2019-08-20 NOTE — MAU Note (Signed)
LC will come to bedside.

## 2019-08-20 NOTE — MAU Note (Addendum)
Lactation Consult Notified she will call back she was in a pt room.

## 2019-08-20 NOTE — ED Triage Notes (Signed)
Pt delivered baby 08-17-19, has been trying to breastfeed.  Breasts are engorged, hard, and reddened.  No fever.

## 2019-08-21 DIAGNOSIS — O9279 Other disorders of lactation: Secondary | ICD-10-CM | POA: Diagnosis not present

## 2019-08-21 MED ORDER — COCONUT OIL OIL
1.0000 "application " | TOPICAL_OIL | Status: DC | PRN
  Filled 2019-08-21 (×2): qty 120

## 2019-08-21 NOTE — Lactation Note (Signed)
Lactation Consultation Note Mom is 3 days post delivery. Mom is engorged and in a lot pain.  Mom has been using hospital electric pump for 1 hr #24 flange. Nipples are swollen and tight in flange. LC unable to hand express anything. Laid mom flat applied Ice bags one to each breast. Asked mom to rotate ice around breast at intervals to help decrease swelling. LC went to see another pt. Then returned. Saw mom upright. Nipples has softened. Hand massage and needing in breast, reverse pressure to areola and around nipple. Hand expression trying to get milk flowing. Breast not compressible. Breast tender and painful to be touched.  LC got mom to do hand expression d/t pain. Mom did then [facility] got milk flowing and softened tissue. Pumped started to Lt. Breast. Massaged breast during pumping to get milk flowing faster. Then massaged and hand expressed on Rt. Breast. It took longer to get milk flowing d/t breast fuller. Pumped Lt. Breast 30 minutes then stopped. Pumped Rt. Breast 30 min. Then stopped. Massaged breast while pumping. Laid mom flat applied Ice. Instructed mom to lay flat 1 hr then pump again for 30 min. Hopefully breast will be softer and mom can go home to BF baby.  Sent sheet w/Engorgement information to be given to mom. Mom pumped 38 ml. Mom stated she hadn't been able to hardly get anything when she pumps. Encouraged mom to assess breast for transfer after baby BF. Encouraged mom to post pump if needed not to allow breast to get engorged like this again.  When LC left, mom still had hardened areas to upper outer aspect of breast. Encouraged mom to massage those areas w/next pumping. Milk storage reviewed. Mom's nipples intact when left.  Reviewed engorgement prevention w/mom.  Reported to CNP.   Patient Name: Barbara Waller Today's Date: 08/21/2019 Reason for consult: Initial assessment;Engorgement   Maternal Data    Feeding    LATCH Score          Comfort  (Breast/Nipple): Engorged, cracked, bleeding, large blisters, severe discomfort        Interventions Interventions: DEBP;Breast compression;Reverse pressure;Ice;Hand express;Breast massage;Expressed milk  Lactation Tools Discussed/Used Tools: Pump Breast pump type: Double-Electric Breast Pump   Consult Status Consult Status: Complete Date: 08/21/19    Charyl Dancer 08/21/2019, 2:37 AM

## 2019-08-21 NOTE — Discharge Instructions (Signed)
Breast Pumping Tips There may be times when you cannot feed your baby from your breast, such as when you are at work or on a trip. Breast pumping allows you to remove milk from your breast in order to store for later use. There are three ways to pump. You can use:  Your hand to massage and squeeze your breast (hand expression).  A handheld manual pump.  An electric pump. When you first start to pump, you may not get much milk, but after a few days your breasts should start to make more. Pumping can help stimulate your milk supply after your baby is born. It can also help maintain your milk supply when you are away from your baby. When should I pump? You can start pumping soon after your baby is born. Here are some tips on when to pump:  When with your baby: ? Pump after breastfeeding. ? Pump from the free breast while you breastfeed.  When away from your baby: ? Pump every 2-3 hours for about 15 minutes. ? Pump both breasts at the same time if you can.  If your baby gets formula feeding, pump around the time your baby gets that feeding.  If you drank alcohol, wait 2 hours before pumping.  If you are having a procedure with anesthesia, talk to your health care provider about when you should pump before and after. How do I prepare to pump? Take steps to relax. This makes it easier to stimulate your let-down reflex, which is what makes breast milk flow. To help:  Smell one of your infant's blankets or an item of clothing.  Look at a picture or video of your infant.  Sit in a quiet, private space.  Massage your breast and nipple.  Place a warm cloth on your breast. The cloth should be a little wet.  Play relaxing music.  Picture your milk flowing. What are some tips? General tips for pumping breast milk   Always wash your hands before pumping.  If you are not getting very much milk or pumping is uncomfortable, make adjustments to your pump or try using different type of  pumps.  Drink enough fluid to keep your urine clear or pale yellow.  Wear clothing that opens in the front or allows easy access to your breasts.  Pump breast milk directly into clean bottles or other storage containers.  Do not use any products that contain nicotine or tobacco, such as cigarettes and e-cigarettes. These can lower your milk supply and harm your infant. If you need help quitting, ask your health care provider. Tips for storing breast milk   Store breast milk in a clean, BPA-free container, such as glass or plastic bottles or milk storage bags.  Store breast milk in 2-4 ounce batches to reduce waste.  Swirl the breast milk in the container to mix any cream that floats to the top. Do not shake it.  Label all stored milk with the date you pumped it.  The amount of time you can keep breast milk depends on where it is stored: ? Room temperature: 6-8 hours, if the milk is clean. It is best if used within 4 hours. ? Cooler with ice packs: 24 hours. ? Refrigerator: 5-8 days, if the milk is clean. It is best if used within 3 days. ? Freezer: 9-12 months, if the milk is clean and stored away from the freezer door. It is best if used within 6 months.  When using a refrigerator   or freezer, put the milk in the back to keep it as cold as possible.  Thaw frozen milk using warm water. Do not use the microwave. Tips for choosing a breast pump The right pump for you will depend on your comfort and how often you will be away from your baby. When choosing a pump, consider the following:  Manual breast pumps do not need electricity to work. They are usually cheaper than electric pumps, but they can be harder to use. They may be a good choice if you are occasionally away from your baby.  Electric breast pumps are usually more expensive than manual pumps, but they can be easier for some women to use. They can also collect more milk than manual pumps. This makes them a good choice for women  who work in an office or need to be away from their baby for longer periods of time.  The suction cup (flange) should be the right size. If it is the wrong size, it may cause pain and nipple damage.  Before buying a pump, find out whether your insurance covers the cost of a breast pump. Tips for maintaining a breast pump  Check your pump's manual for cleaning tips.  Clean the pump after each use. To do this: ? Wipe down the electrical unit. Use a dry, soft cloth or clean paper towel. Do not put the electrical unit in water or cleaning products. ? Wash the plastic pump parts with soap and warm water or in the dishwasher, if the parts are dishwasher safe. You do not need to clean the tubing unless it comes in contact with breast milk. Let the parts air dry. Avoid drying them with a cloth or towel. ? When the pump parts are clean and dry, put the pump back together. Then store the pump.  If there is water in the tubing when it comes time to pump, attach the tubing to the pump and turn on the pump. Run the pump until the tube is dry.  Avoid touching the inside of pump parts that come in contact with breast milk. Summary  Pumping can help stimulate your milk supply after your baby is born. It can also help maintain your milk supply when you are away from your baby.  When you are away from your infant for several hours, pump for about 15 minutes every 2-3 hours. Pump both breasts at the same time, if you can.  Your health care provider or lactation consultant can help you decide which breast pump is right for you. The right pump for you depends on your comfort, work schedule, and how often you may be away from your baby. This information is not intended to replace advice given to you by your health care provider. Make sure you discuss any questions you have with your health care provider. Document Revised: 07/21/2018 Document Reviewed: 05/05/2016 Elsevier Patient Education  2020 [employer]  [employer].  

## 2019-08-21 NOTE — MAU Provider Note (Signed)
History     CSN: [id number]  Arrival date and time: 08/20/19 2236   First Provider Initiated Contact with Patient 08/20/19 2315      Chief Complaint  Patient presents with  . Breast Problem   HPI  Barbara Waller is a 34 y.o. [phone] postpartum patient who presents to MAU with chief complaint of breast engorgement. This is a new problem, onset today. She is s/p spontaneous vaginal delivery over intact perineum on 08/17/2019 with discharge home on 08/19/2019. Patient reports multiple attempts to relieve her breast pain including putting baby to breast, warm compresses, hand expression, hot shower but has not been successful. She is also concerned that her home pump may not be working correctly. She is s/p multiple sessions with Lactation during her inpatient period.  She denies fever, abnormal nipple discharge, breast discoloration, chills.   OB History    Gravida  3   Para  3   Term  3   Preterm      AB      Living  3     SAB      TAB      Ectopic      Multiple  0   Live Births  3           Past Medical History:  Diagnosis Date  . Anemia 08/04/2017  . BMI 32.0-32.9,adult 06/26/2015  . Breast cyst, left 10/02/2018  . Breast lump on left side at 6 o'clock position 10/21/2018  . Breast pain, left 10/02/2018  . Chronic midline low back pain without sciatica 05/16/2016  . Flu-like symptoms 05/25/2018  . Influenza B 05/25/2018  . Left breast abscess 06/26/2015  . 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 Obesity   . Sore throat 05/25/2018  . Vaginal Pap smear, abnormal   . Vitamin D deficiency 08/04/2017    Past Surgical History:  Procedure Laterality Date  . axilla abscess    . COLOSTOMY      Family History  Problem Relation Age of Onset  . Diabetes Mother   . Hypertension Mother   . Diabetes Maternal Grandmother   . Diabetes Father   . Heart failure Father   . Anesthesia problems Neg Hx     Social  History   Tobacco Use  . Smoking status: Never Smoker  . Smokeless tobacco: Never Used  Substance Use Topics  . Alcohol use: No  . Drug use: No    Allergies:  Allergies  Allergen Reactions  . Ferrous Sulfate Swelling  . Shellfish Allergy Swelling    Medications Prior to Admission  Medication Sig Dispense Refill Last Dose  . acetaminophen (TYLENOL) 325 MG tablet Take 2 tablets (650 mg total) by mouth every 6 (six) hours as needed (for pain scale < 4). 30 tablet 0   . Blood Pressure Monitoring DEVI Check blood pressure 1-2 times per week and make sure to document in baby scripts. 1 Product 0   . Elastic Bandages & Supports (COMFORT FIT MATERNITY SUPP LG) MISC 1 Device by Does not apply route daily as needed. (Patient not taking: Reported on 06/22/2019) 1 each 0   . famotidine (PEPCID) 20 MG tablet Take 1 tablet (20 mg total) by mouth 2 (two) times daily. (Patient taking differently: Take 20 mg by mouth 2 (two) times daily as needed for heartburn or indigestion. ) 60 tablet 3   . ibuprofen (ADVIL) 600 MG tablet Take 1 tablet (  600 mg total) by mouth every 8 (eight) hours as needed for mild pain. 30 tablet 0   . norethindrone (ORTHO MICRONOR) 0.35 MG tablet Take 1 tablet (0.35 mg total) by mouth daily. 1 Package 11   . polyethylene glycol powder (GLYCOLAX/MIRALAX) 17 GM/SCOOP powder Take 17 g by mouth daily as needed. 510 g 1   . Prenatal Vit-Fe Fumarate-FA (PRENATAL MULTIVITAMIN) TABS tablet Take 1 tablet by mouth daily at 12 noon.     . sodium chloride (OCEAN) 0.65 % SOLN nasal spray Place 1 spray into both nostrils as needed for congestion.       Review of Systems  Constitutional: Negative for chills and fever.  Gastrointestinal: Negative for abdominal pain.  Genitourinary: Negative for dysuria and vaginal discharge.  Musculoskeletal: Negative for back pain.  Skin: Negative for color change.  All other systems reviewed and are negative.  Physical Exam   Blood pressure 106/78,  pulse 76, temperature 98.2 F (36.8 C), temperature source Oral, resp. rate 17, SpO2 95 %, unknown if currently breastfeeding.  Physical Exam  Nursing note and vitals reviewed. Constitutional: She appears well-developed and well-nourished.  Cardiovascular: Normal rate and normal heart sounds.  Respiratory: Effort normal.  GI: Soft. Bowel sounds are normal. She exhibits no distension. There is no abdominal tenderness. There is no rebound and no guarding.  Skin: Skin is warm and dry.  Both breasts hard to touch, skin pulled tight across tissue. Very full on palpation. Unable to induce leaking with application of warm compresses and initiation of pumping.  Psychiatric: She has a normal mood and affect. Her behavior is normal. Judgment and thought content normal.    MAU Course/MDM  Procedures  --IBCLC requested in MAU. At beside to assist with pumping. --Patient able to pump a total of three times during time in MAU. Endorses feeling "so much better". Breats soft, non-tender prior to discharge   Patient Vitals for the past 24 hrs:  BP Temp Temp src Pulse Resp SpO2  08/21/19 0342 124/83 (!) 97.5 F (36.4 C) Oral 84 18 --  08/20/19 2313 106/78 98.2 F (36.8 C) Oral 76 17 --  08/20/19 2239 115/78 98.9 F (37.2 C) Oral 85 18 95 %    Assessment and Plan  --34 y.o. G3P3003 postpartum --Total of 4 episodes of pumping in MAU --S/p multiple pumping sessions with IBCLC in MAU --Patient endorses resolution of discomfort, readiness for discharge --Discharge home in stable condition  Darlina Rumpf, CNM 08/21/2019, 8:26 AM

## 2019-09-14 ENCOUNTER — Ambulatory Visit (INDEPENDENT_AMBULATORY_CARE_PROVIDER_SITE_OTHER): Payer: Medicaid Other | Admitting: Family Medicine

## 2019-09-14 ENCOUNTER — Other Ambulatory Visit: Payer: Self-pay

## 2019-09-14 ENCOUNTER — Encounter: Payer: Self-pay | Admitting: Family Medicine

## 2019-09-14 ENCOUNTER — Other Ambulatory Visit: Payer: Self-pay | Admitting: Family Medicine

## 2019-09-14 MED ORDER — NORETHINDRONE-ETH ESTRADIOL 0.5-35 MG-MCG PO TABS: 1.0000 | ORAL_TABLET | Freq: Every day | ORAL | 4 refills | Status: DC

## 2019-09-14 MED FILL — NORETHIND-ETH ESTRAD 0.5-2.: 0.5-2.5 | 84 days supply | Qty: 84 | Fill #0

## 2019-09-14 NOTE — Progress Notes (Signed)
    Post Partum Visit Note  Barbara Waller is a 34 y.o. G108P3003 female who presents for a postpartum visit. She is 4 weeks postpartum following a  Vaginal of delivery:  I have fully reviewed the prenatal and intrapartum course. The delivery was at 39.2 gestational weeks.  Anesthesia: Epidual. Postpartum course has been uncomplicated. Baby is doing well. Baby is feeding by formula. Bleeding not at this. Bowel function is normal. Bladder function is normal. Patient is sexually active.Contraception method is birth control pills. Postpartum depression screening: negative The following portions of the patient's history were reviewed and updated as appropriate: allergies, current medications, past family history, past medical history, past social history, past surgical history and problem list.  Review of Systems Pertinent items are noted in HPI.    Objective:  unknown if currently breastfeeding.  General:  alert and cooperative   Breasts:  not examined  Lungs: normal WOB  Heart:  regular rate and rhythm  Abdomen: soft   Vulva:  not evaluated  Vagina: not evaluated  Cervix:  not indicated  Corpus: not examined  Adnexa:  not evaluated  Rectal Exam: Not performed.        Assessment:   Normal  postpartum exam. Pap smear not done at today's visit.   Plan:   Essential components of care per ACOG recommendations:  1.  Mood and well being: Patient with negative depression screening today. Reviewed local resources for support.  - Patient does not use tobacco.  - hx of drug use? No    2. Infant care and feeding:  -Patient currently breastmilk feeding? No  -Social determinants of health (SDOH) reviewed in EPIC. No concerns.   3. Sexuality, contraception and birth spacing - Patient does not want a pregnancy in the next year.  Desired family size is 3 children.  - Reviewed forms of contraception in tiered fashion. Patient desired oral contraceptives (estrogen/progesterone) today.     - Discussed birth spacing of 18 months  4. Sleep and fatigue -Encouraged family/partner/community support of 4 hrs of uninterrupted sleep to help with mood and fatigue  5. Physical Recovery  - Discussed patients delivery and complications - Patient had no OB laceration, perineal healing reviewed. Patient expressed understanding - Patient has urinary incontinence? No. Patient was referred to pelvic floor PT  - Patient is safe to resume physical and sexual activity in 2 weeks  6.  Health Maintenance - Last pap smear done normal and was with negative HPV.  7. Chronic Disease - Regular visits annually for HA1C given prediabetes and obesity - PCP follow up for acute concerns  Federico Flake, MD Center for [facility], [facility]

## 2019-09-14 NOTE — Progress Notes (Deleted)
° ° °  Post Partum Visit Note  Barbara Waller is a 34 y.o. G69P3003 female who presents for a postpartum visit. She is 4 weeks postpartum following a  Vaginal of delivery:  I have fully reviewed the prenatal and intrapartum course. The delivery was at 39.2 gestational weeks.  Anesthesia: Epidual. Postpartum course has been uncomplicated. Baby is doing well. Baby is feeding by formula. Bleeding not at this. Bowel function is normal. Bladder function is normal. Patient is sexually active.Contraception method is birth control pills. Postpartum depression screening: negative {Common ambulatory SmartLinks:19316}  Review of Systems {ros; complete:30496}    Objective:  unknown if currently breastfeeding.  General:  {gen appearance:16600}   Breasts:  {breast exam:1202::"inspection negative, no nipple discharge or bleeding, no masses or nodularity palpable"}  Lungs: {lung exam:16931}  Heart:  {heart exam:5510}  Abdomen: {abdomen exam:16834}   Vulva:  {labia exam:12198}  Vagina: {vagina exam:12200}  Cervix:  {cervix exam:14595}  Corpus: {uterus exam:12215}  Adnexa:  {adnexa exam:12223}  Rectal Exam: {rectal/vaginal exam:12274}        Assessment:    *** postpartum exam. Pap smear {done:10129} at today's visit.   Plan:   Essential components of care per ACOG recommendations:  1.  Mood and well being: Patient with {gen negative/positive:315881} depression screening today. Reviewed local resources for support.  - Patient {Action; does/does not:19097} use tobacco. ***If using tobacco we discussed reduction and for recently cessation risk of relapse - hx of drug use? {yes/no:20286}  *** If yes, discussed support systems  2. Infant care and feeding:  -Patient currently breastmilk feeding? {yes/no:20286} ***If breastmilk feeding discussed return to work and pumping. If needed, patient was provided letter for work to allow for every 2-3 hr pumping breaks, and to be granted a private location  to express breastmilk and refrigerated area to store breastmilk. Reviewed importance of draining breast regularly to support lactation. -Social determinants of health (SDOH) reviewed in EPIC. No concerns***The following needs were identified***  3. Sexuality, contraception and birth spacing - Patient {DOES_DOES KYH:06237} want a pregnancy in the next year.  Desired family size is {NUMBER 1-10:22536} children.  - Reviewed forms of contraception in tiered fashion. Patient desired {PLAN CONTRACEPTION:313102} today.   - Discussed birth spacing of 18 months  4. Sleep and fatigue -Encouraged family/partner/community support of 4 hrs of uninterrupted sleep to help with mood and fatigue  5. Physical Recovery  - Discussed patients delivery*** and complications - Patient had a *** degree laceration, perineal healing reviewed. Patient expressed understanding - Patient has urinary incontinence? {yes/no:20286}*** Patient was referred to pelvic floor PT  - Patient {ACTION; IS/IS SEG:31517616} safe to resume physical and sexual activity  6.  Health Maintenance - Last pap smear done normal and was with negative HPV. ***Mammogram  7. ***Chronic Disease - PCP follow up  Demetrice A Cheree Ditto, CMA Center for [employer], [facility]

## 2019-09-27 ENCOUNTER — Encounter: Payer: Medicaid Other | Admitting: Family Medicine

## 2019-09-30 MED FILL — NORETHIND-ETH ESTRAD 0.5-2.: 0.5-2.5 | 84 days supply | Qty: 84 | Fill #0

## 2019-11-01 ENCOUNTER — Encounter: Payer: Self-pay | Admitting: Obstetrics & Gynecology

## 2019-11-01 ENCOUNTER — Other Ambulatory Visit: Payer: Self-pay

## 2019-11-01 ENCOUNTER — Telehealth (INDEPENDENT_AMBULATORY_CARE_PROVIDER_SITE_OTHER): Payer: Medicaid Other | Admitting: Obstetrics & Gynecology

## 2019-11-01 DIAGNOSIS — Z3009 Encounter for other general counseling and advice on contraception: Secondary | ICD-10-CM

## 2019-11-01 NOTE — Progress Notes (Signed)
GYNECOLOGY VIRTUAL CONSULT NOTE  Provider location: Center for [facility] at [facility]   I connected with Barbara Waller on 11/01/19 at  1:45 PM EDT by MyChart Video Encounter at home and verified that I am speaking with the correct person using two identifiers.   I discussed the limitations, risks, security and privacy concerns of performing an evaluation and management service virtually and the availability of in person appointments. I also discussed with the patient that there may be a patient responsible charge related to this service. The patient expressed understanding and agreed to proceed.   History:  Barbara Waller is a 34 y.o. [phone] female being evaluated today for female sterilization.  Currently on OCPs, but desires permanent sterilization. She denies any abnormal vaginal discharge, bleeding, pelvic pain or other concerns.      Past Medical History:  Diagnosis Date  . Anemia 08/04/2017  . BMI 32.0-32.9,adult 06/26/2015  . Breast cyst, left 10/02/2018  . Breast lump on left side at 6 o'clock position 10/21/2018  . Breast pain, left 10/02/2018  . Chronic midline low back pain without sciatica 05/16/2016  . Flu-like symptoms 05/25/2018  . Influenza B 05/25/2018  . Left breast abscess 06/26/2015  . 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 Obesity   . Sore throat 05/25/2018  . Vaginal Pap smear, abnormal   . Vitamin D deficiency 08/04/2017   Past Surgical History:  Procedure Laterality Date  . axilla abscess    . COLOSTOMY     The following portions of the patient's history were reviewed and updated as appropriate: allergies, current medications, past family history, past medical history, past social history, past surgical history and problem list.   Health Maintenance:  Normal pap and negative HRHPV on 01/26/2019.     Review of Systems:  Pertinent items noted in HPI and remainder of comprehensive ROS  otherwise negative.  Physical Exam:   General:  Alert, oriented and cooperative. Patient appears to be in no acute distress.  Mental Status: Normal mood and affect. Normal behavior. Normal judgment and thought content.   Respiratory: Normal respiratory effort, no problems with respiration noted  Rest of physical exam deferred due to type of encounter      Assessment and Plan:     Consultation for female sterilization Patient desires permanent sterilization.  Other reversible forms of contraception (over the counter/barrier methods; hormonal contraceptives including pill, patch, ring, Depo-Provera injection, Nexplanon implant; hormonal IUDs Skyla and Mirena; nonhormonal copper IUD Paragard) were discussed with patient; she declined all these modalities. Also discussed the option of vasectomy for her female partner; she also declined this option. She was given the choice between laparoscopic bilateral tubal sterilization using Filshie clips or laparoscopic bilateral salpingectomy. For the Filshie clip sterilization, she was told she will have one incision in her umbilicus.  Failure risk of 1-2 % with increased risk of ectopic gestation if pregnancy occurs was also discussed with patient. For the bilateral salpingectomy, she was told that both tubes will be resected via three small incisions; the failure risk of less than 1%.  Any future pregnancies will have to be attempted via IVF or other fertility procedures.  Reiterated permanence and irreversibility of both procedures; in the case of Filshie clip application, attempts to reverse tubal sterilization are often not successful.  Also emphasized risk of regret which is noted more in patients less than the age of 29.  All questions  were answered. She desires laparoscopic bilateral salpingectomy.  Other risks of the procedure were discussed with patient including but not limited to: bleeding, infection, injury to surrounding organs and need for  additional procedures.  Also discussed possibility of post-tubal pain syndrome. Patient verbalized understanding of these risks and wants to proceed with this procedure.  She was told that she will be contacted by our surgical scheduler regarding the time and date of her surgery; routine preoperative instructions of having nothing to eat or drink after midnight on the day prior to surgery and also coming to the hospital 1.5 hours prior to her time of surgery were also emphasized.  She was told she may be called for a preoperative appointment about a week prior to surgery and will be given further preoperative instructions at that visit. Also discussed need for preoperative COVID testing, instructions about this will be given to patient.  Patient education handouts about the procedure were given to the patient to review at home. Medicaid papers were signed today.    I discussed the assessment and treatment plan with the patient. The patient was provided an opportunity to ask questions and all were answered. The patient agreed with the plan and demonstrated an understanding of the instructions.   The patient was advised to call back or seek an in-person evaluation/go to the ED if the symptoms worsen or if the condition fails to improve as anticipated.  I provided 17 minutes of face-to-face time during this encounter.   Jaynie Collins, MD Center for [employer], [facility]

## 2019-11-01 NOTE — Patient Instructions (Signed)
Laparoscopic Tubal Ligation Laparoscopic tubal ligation is a procedure to close the fallopian tubes. This is done so that you cannot get pregnant. When the fallopian tubes are closed, the eggs that your ovaries release cannot enter the uterus, and sperm cannot reach the released eggs. You should not have this procedure if you want to get pregnant someday or if you are unsure about having more children. Tell a health care provider about:  Any allergies you have.  All medicines you are taking, including vitamins, herbs, eye drops, creams, and over-the-counter medicines.  Any problems you or family members have had with anesthetic medicines.  Any blood disorders you have.  Any surgeries you have had.  Any medical conditions you have.  Whether you are pregnant or may be pregnant.  Any past pregnancies. What are the risks? Generally, this is a safe procedure. However, problems may occur, including:  Infection.  Bleeding.  Injury to other organs in the abdomen.  Side effects from anesthetic medicines.  Failure of the procedure. This procedure can increase your risk of a kind of pregnancy in which a fertilized egg attaches to the outside of the uterus (ectopic pregnancy). What happens before the procedure? Medicines  Ask your health care provider about: ? Changing or stopping your regular medicines. This is especially important if you are taking diabetes medicines or blood thinners. ? Taking medicines such as aspirin and ibuprofen. These medicines can thin your blood. Do not take these medicines unless your health care provider tells you to take them. ? Taking over-the-counter medicines, vitamins, herbs, and supplements. Staying hydrated  Follow instructions from your health care provider about hydration, which may include: ? Up to 2 hours before the procedure - you may continue to drink clear liquids, such as water, clear fruit juice, black coffee, and plain tea. Eating and  drinking  Follow instructions from your health care provider about eating and drinking, which may include: ? 8 hours before the procedure - stop eating heavy meals or foods, such as meat, fried foods, or fatty foods. ? 6 hours before the procedure - stop eating light meals or foods, such as toast or cereal. ? 6 hours before the procedure - stop drinking milk or drinks that contain milk. ? 2 hours before the procedure - stop drinking clear liquids. General instructions  Do not use any products that contain nicotine or tobacco for at least 4 weeks before the procedure. These products include cigarettes, e-cigarettes, and chewing tobacco. If you need help quitting, ask your health care provider.  Plan to have someone take you home from the hospital.  If you will be going home right after the procedure, plan to have someone with you for 24 hours.  Ask your health care provider: ? How your surgery site will be marked. ? What steps will be taken to help prevent infection. These may include:  Removing hair at the surgery site.  Washing skin with a germ-killing soap.  Taking antibiotic medicine. What happens during the procedure?      An IV will be inserted into one of your veins.  You will be given one or more of the following: ? A medicine to help you relax (sedative). ? A medicine to numb the area (local anesthetic). ? A medicine to make you fall asleep (general anesthetic). ? A medicine that is injected into an area of your body to numb everything below the injection site (regional anesthetic).  Your bladder may be emptied with a small tube (  catheter).  If you have been given a general anesthetic, a tube will be put down your throat to help you breathe.  Two small incisions will be made in your lower abdomen and near your belly button.  Your abdomen will be inflated with a gas. This will let the surgeon see better and will give the surgeon room to work.  A thin, lighted tube  (laparoscope) with a camera attached will be inserted into your abdomen through one of the incisions. Small instruments will be inserted through the other incision.  The fallopian tubes will be tied off, burned (cauterized), or blocked with a clip, ring, or clamp. A small portion in the center of each fallopian tube may be removed.  The gas will be released from the abdomen.  The incisions will be closed with stitches (sutures).  A bandage (dressing) will be placed over the incisions. The procedure may vary among health care providers and hospitals. What happens after the procedure?  Your blood pressure, heart rate, breathing rate, and blood oxygen level will be monitored until you leave the hospital.  You will be given medicine to help with pain, nausea, and vomiting as needed. Summary  Laparoscopic tubal ligation is a procedure that is done so that you cannot get pregnant.  You should not have this procedure if you want to get pregnant someday or if you are unsure about having more children.  The procedure is done using a thin, lighted tube (laparoscope) with a camera attached that will be inserted into your abdomen through an incision.  Follow instructions from your health care provider about eating and drinking before the procedure. This information is not intended to replace advice given to you by your health care provider. Make sure you discuss any questions you have with your health care provider. Document Revised: 09/07/2018 Document Reviewed: 02/23/2018 Elsevier Patient Education  2020 [employer].    Salpingectomy Salpingectomy, also called tubectomy, is the surgical removal of one of the fallopian tubes. The fallopian tubes are where eggs travel from the ovaries to the uterus. Removing one fallopian tube does not prevent you from becoming pregnant. It also does not cause problems with your menstrual periods. You may need this procedure if you:  Have a fertilized egg that  attaches to the fallopian tube (ectopic pregnancy), especially one that causes the tube to burst or tear (rupture).  Have an infected fallopian tube.  Have cancer of the fallopian tube or nearby organs.  Have had an ovary removed due to a cyst or tumor.  Have had your uterus removed.  Are at high risk for ovarian cancer. There are three different methods that can be used for a salpingectomy:  An open method that involves making one large incision in your abdomen.  A laparoscopic method that involves using a thin, lighted tube with a tiny camera on the end (laparoscope) to help perform the procedure. The laparoscope will allow your surgeon to make several small incisions in the abdomen instead of one large incision.  A robot-assisted method that involves using a computer to control surgical instruments that are attached to robotic arms. Tell a health care provider about:  Any allergies you have.  All medicines you are taking, including vitamins, herbs, eye drops, creams, and over-the-counter medicines.  Any problems you or family members have had with anesthetic medicines.  Any blood disorders you have.  Any surgeries you have had.  Any medical conditions you have.  Whether you are pregnant or may  be pregnant. What are the risks? Generally, this is a safe procedure. However, problems may occur, including:  Infection.  Bleeding.  Allergic reactions to medicines.  Blood clots in the legs or lungs.  Damage to other structures or organs. What happens before the procedure? Medicines  Ask your health care provider about: ? Changing or stopping your regular medicines. This is especially important if you are taking diabetes medicines or blood thinners. ? Taking medicines such as aspirin and ibuprofen. These medicines can thin your blood. Do not take these medicines unless your health care provider tells you to take them. ? Taking over-the-counter medicines, vitamins, herbs,  and supplements. Staying hydrated Follow instructions from your health care provider about hydration, which may include:  Up to 2 hours before the procedure - you may continue to drink clear liquids, such as water, clear fruit juice, black coffee, and plain tea. Eating and drinking restrictions Follow instructions from your health care provider about eating and drinking, which may include:  8 hours before the procedure - stop eating heavy meals or foods, such as meat, fried foods, or fatty foods.  6 hours before the procedure - stop eating light meals or foods, such as toast or cereal.  6 hours before the procedure - stop drinking milk or drinks that contain milk.  2 hours before the procedure - stop drinking clear liquids. General instructions  Do not use any products that contain nicotine or tobacco for at least 4 weeks before the procedure. These products include cigarettes, e-cigarettes, and chewing tobacco. If you need help quitting, ask your health care provider.  You may have an exam or tests, such as: ? An electrocardiogram (ECG). ? A blood or urine test.  Ask your health care provider what steps will be taken to help prevent infection. These may include: ? Removing hair at the surgery site. ? Washing skin with a germ-killing soap. ? Taking antibiotic medicine.  Plan to have someone take you home from the hospital or clinic.  If you will be going home right after the procedure, plan to have someone with you for 24 hours. What happens during the procedure?  An IV will be inserted into one of your veins.  You will be given one or more of the following: ? A medicine to help you relax (sedative). ? A medicine to make you fall asleep (general anesthetic).  A thin tube (catheter) may be inserted through your urethra and into your bladder to drain urine during your procedure.  Depending on the type of procedure you are having, one incision or several small incisions will be  made in your abdomen.  Your fallopian tube will be cut and removed from where it attaches to your uterus.  Your blood vessels will be clamped and tied to prevent excess bleeding.  The incisions in your abdomen will be closed with stitches (sutures), staples, or skin glue.  A bandage (dressing) may be placed over your incisions. The procedure may vary among health care providers and hospitals. What happens after the procedure?   Your blood pressure, heart rate, breathing rate, and blood oxygen level will be monitored until you leave the hospital.  You may continue to receive fluids and medicines through an IV.  You may continue to have a catheter draining your urine.  You may have to wear compression stockings. These stockings help to prevent blood clots and reduce swelling in your legs.  You will be given pain medicine as needed.  Do not  drive for 24 hours if you were given a sedative during your procedure. Summary  Salpingectomy is a surgical procedure to remove one of the fallopian tubes.  The procedure may be done with an open incision, a laparoscope, or computer-controlled instruments.  Depending on the type of procedure you are having, one incision or several small incisions will be made in your abdomen.  Your blood pressure, heart rate, breathing rate, and blood oxygen level will be monitored until you leave the hospital.  Plan to have someone take you home from the hospital or clinic. This information is not intended to replace advice given to you by your health care provider. Make sure you discuss any questions you have with your health care provider. Document Revised: 03/22/2018 Document Reviewed: 03/22/2018 Elsevier Patient Education  2020 [employer].

## 2019-11-13 DIAGNOSIS — E538 Deficiency of other specified B group vitamins: Secondary | ICD-10-CM

## 2019-11-13 HISTORY — DX: Deficiency of other specified B group vitamins: E53.8

## 2019-12-12 ENCOUNTER — Encounter: Payer: Medicaid Other | Admitting: Family Medicine

## 2019-12-13 ENCOUNTER — Encounter: Payer: Self-pay | Admitting: Family Medicine

## 2019-12-13 ENCOUNTER — Other Ambulatory Visit: Payer: Self-pay

## 2019-12-13 ENCOUNTER — Ambulatory Visit (INDEPENDENT_AMBULATORY_CARE_PROVIDER_SITE_OTHER): Payer: Medicaid Other | Admitting: Family Medicine

## 2019-12-13 VITALS — BP 113/71 | HR 87 | Temp 98.4°F | Resp 16 | Ht 64.0 in | Wt 207.2 lb

## 2019-12-13 DIAGNOSIS — R7303 Prediabetes: Secondary | ICD-10-CM | POA: Diagnosis not present

## 2019-12-13 DIAGNOSIS — Z0000 Encounter for general adult medical examination without abnormal findings: Secondary | ICD-10-CM | POA: Diagnosis not present

## 2019-12-13 DIAGNOSIS — N6002 Solitary cyst of left breast: Secondary | ICD-10-CM | POA: Diagnosis not present

## 2019-12-13 DIAGNOSIS — Z09 Encounter for follow-up examination after completed treatment for conditions other than malignant neoplasm: Secondary | ICD-10-CM

## 2019-12-13 LAB — POCT URINALYSIS DIPSTICK
Bilirubin, UA: NEGATIVE
Blood, UA: NEGATIVE
Glucose, UA: NEGATIVE
Ketones, UA: NEGATIVE
Leukocytes, UA: NEGATIVE
Nitrite, UA: NEGATIVE
Protein, UA: NEGATIVE
Spec Grav, UA: >=1.03 — AB (ref 1.010–1.025)
Urobilinogen, UA: 0.2 E.U./dL
pH, UA: 5.5 (ref 5.0–8.0)

## 2019-12-13 NOTE — Progress Notes (Signed)
[facility]   Annual Physical  Subjective:  Patient ID: Barbara Waller, female    DOB: [birth year]  Age: 34 y.o. MRN: [id number]  CC:  Chief Complaint  Patient presents with  . 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    HPI Barbara Waller is a 34 year old female who presents for her Annual Physical today.    Patient Active Problem List   Diagnosis Date Noted  . 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 Prediabetes 01/03/2016    Past Medical History:  Diagnosis Date  . Anemia 08/04/2017  . BMI 32.0-32.9,adult 06/26/2015  . Breast cyst, left 10/02/2018  . Breast lump on left side at 6 o'clock position 10/21/2018  . Breast pain, left 10/02/2018  . Chronic midline low back pain without sciatica 05/16/2016  . Flu-like symptoms 05/25/2018  . Influenza B 05/25/2018  . Left breast abscess 06/26/2015  . 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 Obesity   . Sore throat 05/25/2018  . Vaginal Pap smear, abnormal   . Vitamin D deficiency 08/04/2017    Past Surgical History:  Procedure Laterality Date  . axilla abscess    . COLOSTOMY     Current Status: Since her last office visit, she is doing well with no complaints. She denies fevers, chills, fatigue, recent infections, weight loss, and night sweats. She has not had any headaches, visual changes, dizziness, and falls. No chest pain, heart palpitations, cough and shortness of breath reported. Denies GI problems such as nausea, vomiting, diarrhea, and constipation. She has no reports of blood in stools, dysuria and hematuria. No depression or anxiety reported today. She is talomg all medications as prescribed. She denies pain today.   Family History  Problem Relation Age of Onset  . Diabetes Mother   . Hypertension Mother   . Diabetes Maternal Grandmother   . Diabetes Father   . Heart  failure Father   . Anesthesia problems Neg Hx     Social History   Socioeconomic History  . Marital status: Divorced    Spouse name: Not on file  . Number of children: 2  . Years of education: Not on file  . Highest education level: Some college, no degree  Occupational History  . Not on file  Tobacco Use  . Smoking status: Never Smoker  . Smokeless tobacco: Never Used  Vaping Use  . Vaping Use: Never used  Substance and Sexual Activity  . Alcohol use: No  . Drug use: No  . Sexual activity: Yes    Partners: Male  Other Topics Concern  . Not on file  Social History Narrative  . Not on file   Social Determinants of Health   Financial Resource Strain:   . Difficulty of Paying Living Expenses: Not on file  Food Insecurity:   . Worried About Programme researcher, broadcasting/film/videounning Out of Food in the Last Year: Not on file  . Ran Out of Food in the Last Year: Not on file  Transportation Needs:   . Lack of Transportation (Medical): Not on file  . Lack of Transportation (Non-Medical): Not on file  Physical Activity:   . Days of Exercise per Week: Not on file  . Minutes of Exercise per Session: Not on file  Stress:   . Feeling of Stress : Not on file  Social Connections:   .  Frequency of Communication with Friends and Family: Not on file  . Frequency of Social Gatherings with Friends and Family: Not on file  . Attends Religious Services: Not on file  . Active Member of Clubs or Organizations: Not on file  . Attends Banker Meetings: Not on file  . Marital Status: Not on file  Intimate Partner Violence:   . Fear of Current or Ex-Partner: Not on file  . Emotionally Abused: Not on file  . Physically Abused: Not on file  . Sexually Abused: Not on file    Outpatient Medications Prior to Visit  Medication Sig Dispense Refill  . acetaminophen (TYLENOL) 325 MG tablet Take 2 tablets (650 mg total) by mouth every 6 (six) hours as needed (for pain scale < 4). 30 tablet 0  . ibuprofen (ADVIL)  600 MG tablet Take 1 tablet (600 mg total) by mouth every 8 (eight) hours as needed for mild pain. 30 tablet 0  . norethindrone-ethinyl estradiol (NECON) 0.5-35 MG-MCG tablet Take 1 tablet by mouth daily. 3 Package 4  . Prenatal Vit-Fe Fumarate-FA (PRENATAL MULTIVITAMIN) TABS tablet Take 1 tablet by mouth daily at 12 noon.    . Blood Pressure Monitoring DEVI Check blood pressure 1-2 times per week and make sure to document in baby scripts. (Patient not taking: Reported on 09/14/2019) 1 Product 0  . Elastic Bandages & Supports (COMFORT FIT MATERNITY SUPP LG) MISC 1 Device by Does not apply route daily as needed. (Patient not taking: Reported on 06/22/2019) 1 each 0  . famotidine (PEPCID) 20 MG tablet Take 1 tablet (20 mg total) by mouth 2 (two) times daily. (Patient not taking: Reported on 09/14/2019) 60 tablet 3  . norethindrone (ORTHO MICRONOR) 0.35 MG tablet Take 1 tablet (0.35 mg total) by mouth daily. (Patient not taking: Reported on 09/14/2019) 1 Package 11  . polyethylene glycol powder (GLYCOLAX/MIRALAX) 17 GM/SCOOP powder Take 17 g by mouth daily as needed. (Patient not taking: Reported on 09/14/2019) 510 g 1  . sodium chloride (OCEAN) 0.65 % SOLN nasal spray Place 1 spray into both nostrils as needed for congestion. (Patient not taking: Reported on 12/13/2019)     No facility-administered medications prior to visit.    Allergies  Allergen Reactions  . Ferrous Sulfate Swelling  . Shellfish Allergy Swelling    ROS Review of Systems  Constitutional: Negative.   HENT: Negative.   Eyes: Negative.   Respiratory: Negative.   Cardiovascular: Negative.   Gastrointestinal: Negative.   Endocrine: Negative.   Genitourinary: Negative.   Musculoskeletal: Negative.   Skin: Negative.   Allergic/Immunologic: Negative.   Neurological: Positive for dizziness (occasional ) and headaches (occasional ).  Hematological: Negative.   Psychiatric/Behavioral: Negative.       Objective:    Physical  Exam Vitals and nursing note reviewed.  Constitutional:      Appearance: Normal appearance.  HENT:     Head: Normocephalic.     Nose: Nose normal.     Mouth/Throat:     Mouth: Mucous membranes are moist.     Pharynx: Oropharynx is clear.  Cardiovascular:     Rate and Rhythm: Normal rate and regular rhythm.     Pulses: Normal pulses.     Heart sounds: Normal heart sounds.  Pulmonary:     Effort: Pulmonary effort is normal.     Breath sounds: Normal breath sounds.  Abdominal:     General: Bowel sounds are normal.     Palpations: Abdomen is soft.  Musculoskeletal:        General: Normal range of motion.     Cervical back: Normal range of motion and neck supple.  Skin:    General: Skin is warm and dry.  Neurological:     General: No focal deficit present.     Mental Status: She is alert and oriented to person, place, and time.  Psychiatric:        Mood and Affect: Mood normal.        Behavior: Behavior normal.        Thought Content: Thought content normal.        Judgment: Judgment normal.    BP 113/71 (BP Location: Left Arm, Patient Position: Sitting, Cuff Size: Normal)   Pulse 87   Temp 98.4 F (36.9 C)   Resp 16   Ht 5\' 4"  (1.626 m)   Wt 207 lb 3.2 oz (94 kg)   LMP 12/07/2019 (Exact Date)   SpO2 99%   Breastfeeding No   BMI 35.57 kg/m  Wt Readings from Last 3 Encounters:  12/13/19 207 lb 3.2 oz (94 kg)  08/17/19 214 lb (97.1 kg)  08/10/19 217 lb (98.4 kg)     Health Maintenance Due  Topic Date Due  . Hepatitis C Screening  Never done  . COVID-19 Vaccine (1) Never done    There are no preventive care reminders to display for this patient.  Lab Results  Component Value Date   TSH 0.507 12/29/2018   Lab Results  Component Value Date   WBC 8.6 08/17/2019   HGB 11.8 (L) 08/17/2019   HCT 37.0 08/17/2019   MCV 81.0 08/17/2019   PLT 281 08/17/2019   Lab Results  Component Value Date   NA 138 02/16/2019   K 3.4 (L) 02/16/2019   CO2 21 02/16/2019    GLUCOSE 87 02/16/2019   BUN 7 02/16/2019   CREATININE 0.70 02/16/2019   BILITOT 0.3 02/16/2019   ALKPHOS 67 02/16/2019   AST 21 02/16/2019   ALT 28 02/16/2019   PROT 7.1 02/16/2019   ALBUMIN 3.8 02/16/2019   CALCIUM 8.9 02/16/2019   Lab Results  Component Value Date   CHOL 120 12/29/2018   Lab Results  Component Value Date   HDL 83 12/29/2018   Lab Results  Component Value Date   LDLCALC 24 12/29/2018   Lab Results  Component Value Date   TRIG 56 12/29/2018   Lab Results  Component Value Date   CHOLHDL 1.4 12/29/2018   Lab Results  Component Value Date   HGBA1C 5.7 (H) 02/16/2019    Assessment & Plan:   1. Annual physical exam No abnormalities noted   2. Breast cyst, left Stable.  3. Prediabetes - CBC with Differential - Comprehensive metabolic panel - TSH - Vitamin B12 - Vitamin D, 25-hydroxy - Lipid Panel - Hemoglobin A1c  4. Healthcare maintenance - Urinalysis Dipstick  5. Follow up She will follow up in 1 year and as needed.   No orders of the defined types were placed in this encounter.   Referral Orders  No referral(s) requested today    13/07/2018,  MSN, FNP-BC [facility] [address] [place], [place] [phone]- fax   Orders Placed This Encounter  Procedures  . CBC with Differential  . Comprehensive metabolic panel  . TSH  . Vitamin B12  . Vitamin D, 25-hydroxy  . Lipid Panel  .  Hemoglobin A1c  . Urinalysis Dipstick   .  Problem List Items Addressed This Visit      Other   Prediabetes   Relevant Orders   CBC with Differential   Comprehensive metabolic panel   TSH   Vitamin B12   Vitamin D, 25-hydroxy   Lipid Panel   Hemoglobin A1c    Other Visit Diagnoses    Annual physical exam    -  Primary   Breast cyst, left       Healthcare maintenance       Relevant Orders   Urinalysis Dipstick    Follow up          No orders of the defined types were placed in this encounter.   Follow-up: No follow-ups on file.    Kallie Locks, FNP

## 2019-12-14 LAB — CBC WITH DIFFERENTIAL/PLATELET
Basophils Absolute: 0 10*3/uL (ref 0.0–0.2)
Basos: 1 %
EOS (ABSOLUTE): 0.1 10*3/uL (ref 0.0–0.4)
Eos: 2 %
Hematocrit: 36.1 % (ref 34.0–46.6)
Hemoglobin: 11.8 g/dL (ref 11.1–15.9)
Immature Grans (Abs): 0 10*3/uL (ref 0.0–0.1)
Immature Granulocytes: 0 %
Lymphocytes Absolute: 1.7 10*3/uL (ref 0.7–3.1)
Lymphs: 26 %
MCH: 26.9 pg (ref 26.6–33.0)
MCHC: 32.7 g/dL (ref 31.5–35.7)
MCV: 82 fL (ref 79–97)
Monocytes Absolute: 0.4 10*3/uL (ref 0.1–0.9)
Monocytes: 6 %
Neutrophils Absolute: 4.1 10*3/uL (ref 1.4–7.0)
Neutrophils: 65 %
Platelets: 385 10*3/uL (ref 150–450)
RBC: 4.39 x10E6/uL (ref 3.77–5.28)
RDW: 13.2 % (ref 11.7–15.4)
WBC: 6.4 10*3/uL (ref 3.4–10.8)

## 2019-12-14 LAB — COMPREHENSIVE METABOLIC PANEL
ALT: 20 IU/L (ref 0–32)
AST: 16 IU/L (ref 0–40)
Albumin/Globulin Ratio: 1.3 (ref 1.2–2.2)
Albumin: 4.3 g/dL (ref 3.8–4.8)
Alkaline Phosphatase: 64 IU/L (ref 48–121)
BUN/Creatinine Ratio: 10 (ref 9–23)
BUN: 10 mg/dL (ref 6–20)
Bilirubin Total: 0.4 mg/dL (ref 0.0–1.2)
CO2: 22 mmol/L (ref 20–29)
Calcium: 9.4 mg/dL (ref 8.7–10.2)
Chloride: 106 mmol/L (ref 96–106)
Creatinine, Ser: 0.98 mg/dL (ref 0.57–1.00)
GFR calc Af Amer: 86 mL/min/{1.73_m2} (ref >59)
GFR calc non Af Amer: 75 mL/min/{1.73_m2} (ref >59)
Globulin, Total: 3.3 g/dL (ref 1.5–4.5)
Glucose: 80 mg/dL (ref 65–99)
Potassium: 3.9 mmol/L (ref 3.5–5.2)
Sodium: 141 mmol/L (ref 134–144)
Total Protein: 7.6 g/dL (ref 6.0–8.5)

## 2019-12-14 LAB — LIPID PANEL
Chol/HDL Ratio: 2.6 ratio (ref 0.0–4.4)
Cholesterol, Total: 136 mg/dL (ref 100–199)
HDL: 53 mg/dL (ref >39)
LDL Chol Calc (NIH): 61 mg/dL (ref 0–99)
Triglycerides: 126 mg/dL (ref 0–149)
VLDL Cholesterol Cal: 22 mg/dL (ref 5–40)

## 2019-12-14 LAB — TSH: TSH: 0.674 u[IU]/mL (ref 0.450–4.500)

## 2019-12-14 LAB — VITAMIN B12: Vitamin B-12: 125 pg/mL — ABNORMAL LOW (ref 232–1245)

## 2019-12-14 LAB — HEMOGLOBIN A1C
Est. average glucose Bld gHb Est-mCnc: 111 mg/dL
Hgb A1c MFr Bld: 5.5 % (ref 4.8–5.6)

## 2019-12-14 LAB — VITAMIN D 25 HYDROXY (VIT D DEFICIENCY, FRACTURES): Vit D, 25-Hydroxy: 26.7 ng/mL — ABNORMAL LOW (ref 30.0–100.0)

## 2019-12-15 ENCOUNTER — Encounter: Payer: Self-pay | Admitting: Family Medicine

## 2019-12-22 ENCOUNTER — Encounter: Payer: Self-pay | Admitting: Family Medicine

## 2019-12-22 NOTE — Telephone Encounter (Signed)
-----   Message from Kallie Locks, FNP sent at 12/22/2019 11:45 AM EDT ----- Vitamin B-12 continues to decrease. Message left for patient to begin daily OTC Vitamin B-12 supplement. We also may possibly prescribe Vitamin B-12 injections for a few moments. Please assess with patient.   All other labs are stable. Keep follow up appointment. Thank you.

## 2019-12-28 MED FILL — NORETHIND-ETH ESTRAD 0.5-2.: 0.5-2.5 | 84 days supply | Qty: 84 | Fill #1

## 2019-12-30 ENCOUNTER — Ambulatory Visit (HOSPITAL_BASED_OUTPATIENT_CLINIC_OR_DEPARTMENT_OTHER): Admit: 2019-12-30 | Payer: Medicaid Other | Admitting: Obstetrics & Gynecology

## 2019-12-30 ENCOUNTER — Encounter (HOSPITAL_BASED_OUTPATIENT_CLINIC_OR_DEPARTMENT_OTHER): Payer: Self-pay

## 2019-12-30 SURGERY — SALPINGECTOMY, BILATERAL, LAPAROSCOPIC
Anesthesia: Choice | Laterality: Bilateral

## 2020-01-07 DIAGNOSIS — Z03818 Encounter for observation for suspected exposure to other biological agents ruled out: Secondary | ICD-10-CM | POA: Diagnosis not present

## 2020-01-07 DIAGNOSIS — Z20822 Contact with and (suspected) exposure to covid-19: Secondary | ICD-10-CM | POA: Diagnosis not present

## 2020-02-20 ENCOUNTER — Other Ambulatory Visit: Payer: Self-pay | Admitting: General Practice

## 2020-02-20 MED FILL — AMOXICILLIN 875 MG TABS: 875 | 7 days supply | Qty: 14 | Fill #0

## 2020-03-27 MED FILL — NORETHIND-ETH ESTRAD 0.5-2.: 0.5-2.5 | 84 days supply | Qty: 84 | Fill #2

## 2020-05-15 IMAGING — US US MFM OB FOLLOW-UP
1 series · 13 of 28 positions shown · non-contrast
Comparison: none

[Series 1: us mfm ob follow-up · 13 of 87 slices shown]
[im 4/87]
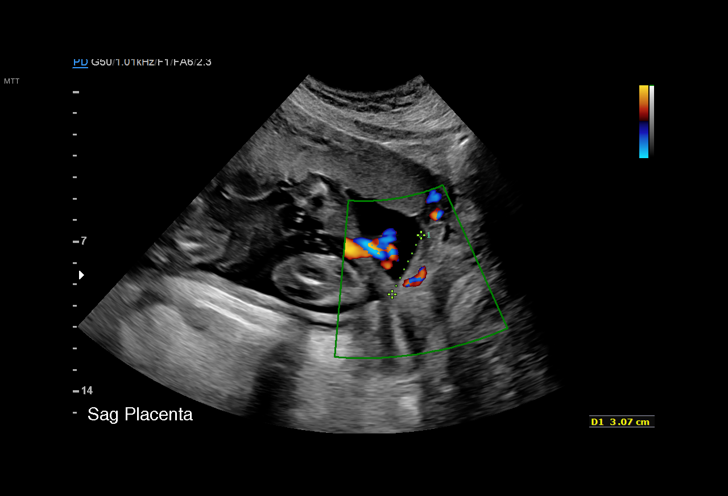
[im 10/87]
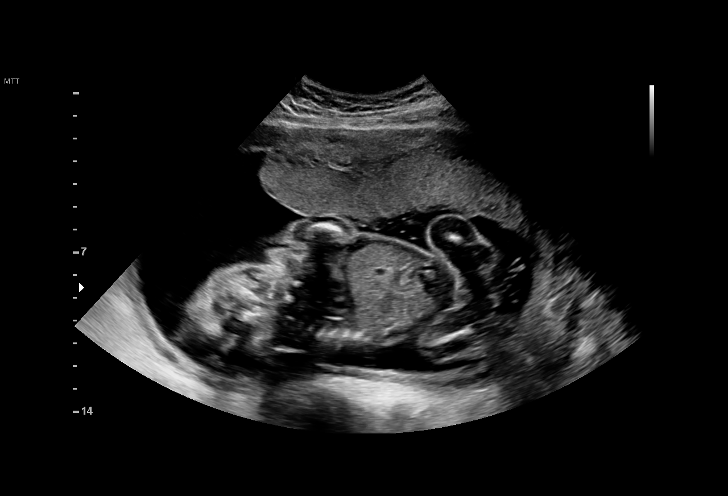
[im 16/87]
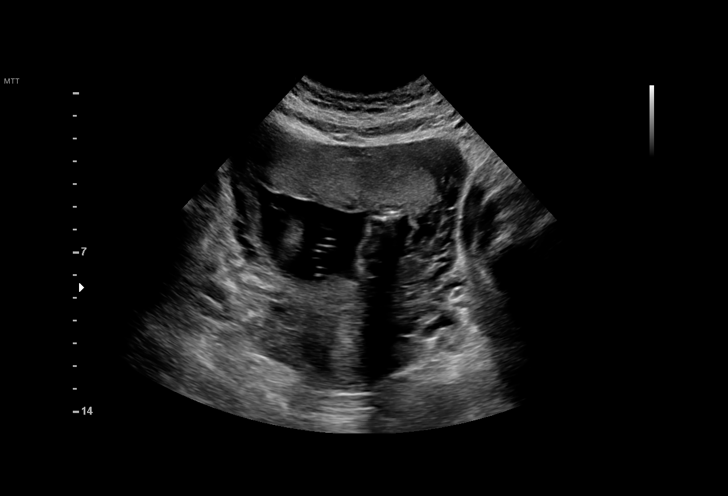
[im 23/87]
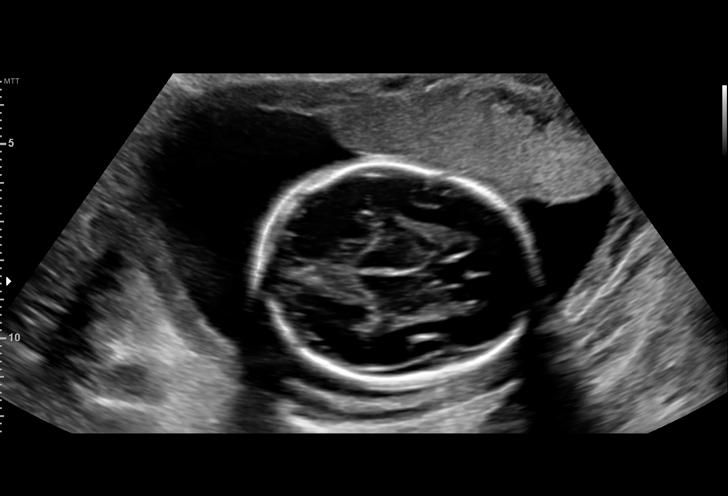
[im 29/87]
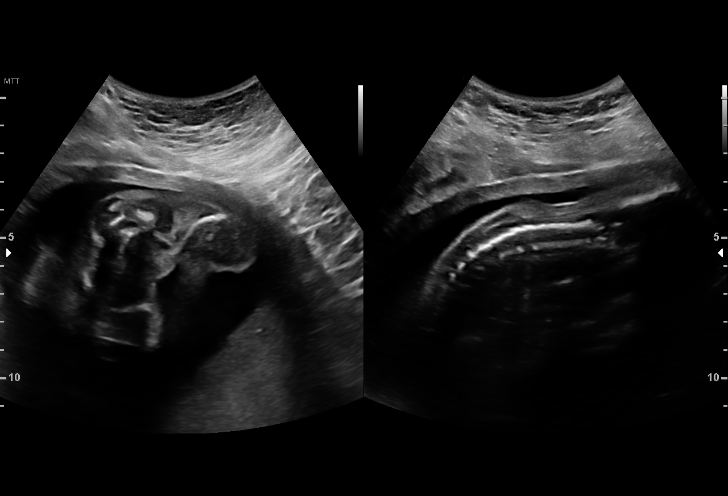
[im 36/87]
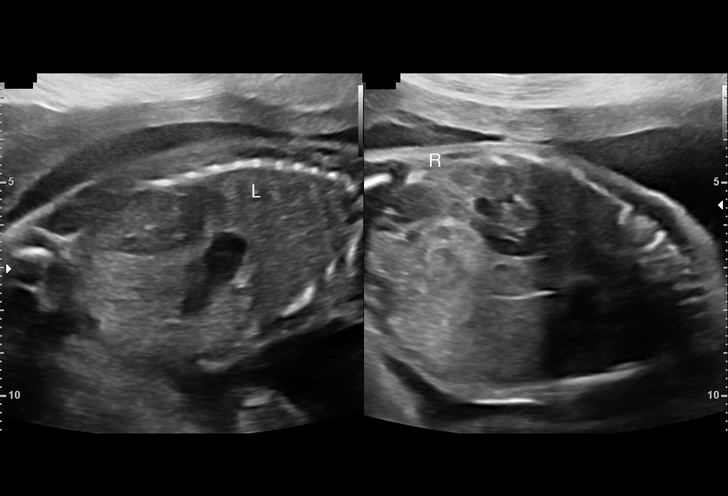
[im 45/87]
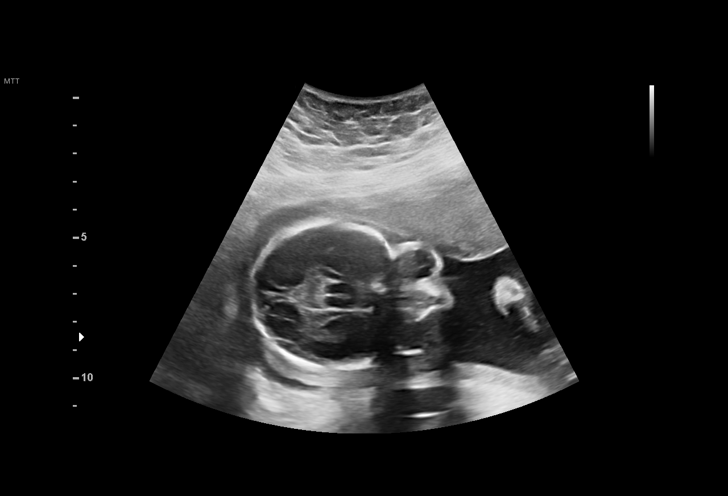
[im 51/87]
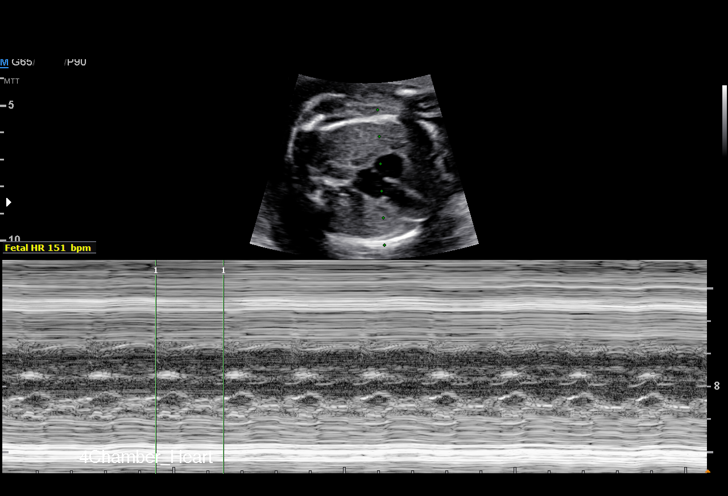
[im 58/87]
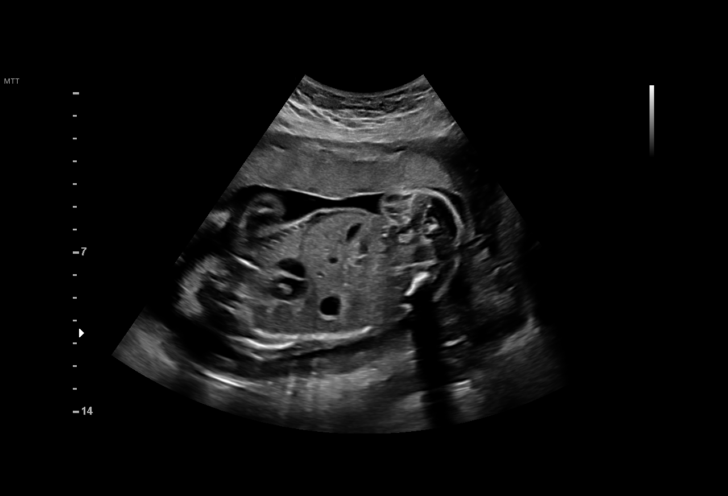
[im 64/87]
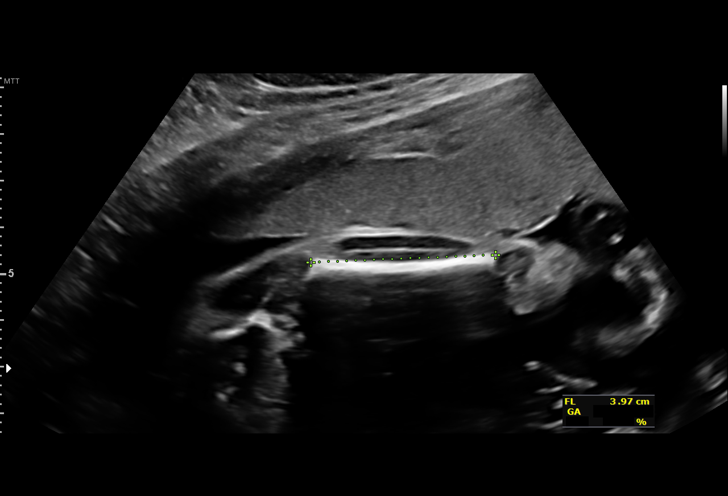
[im 71/87]
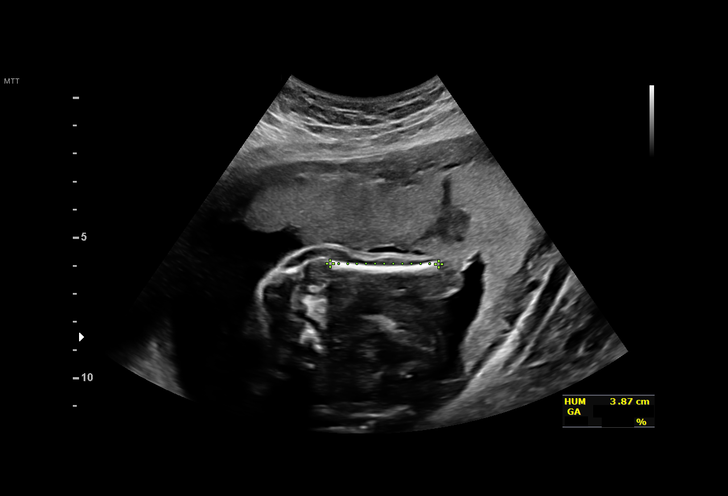
[im 77/87]
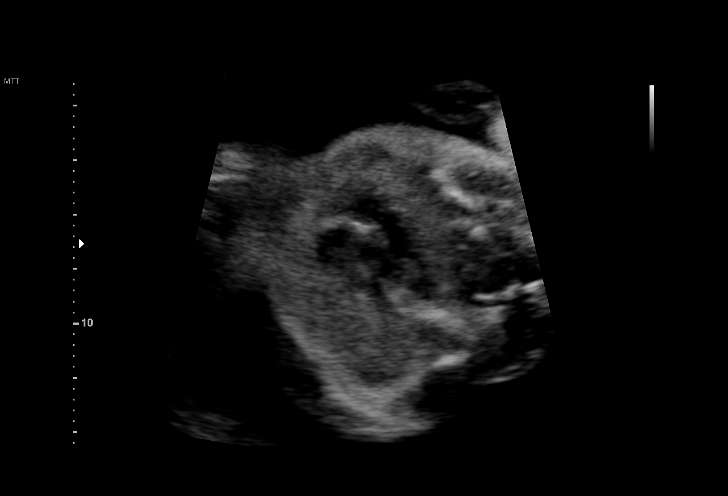
[im 83/87]
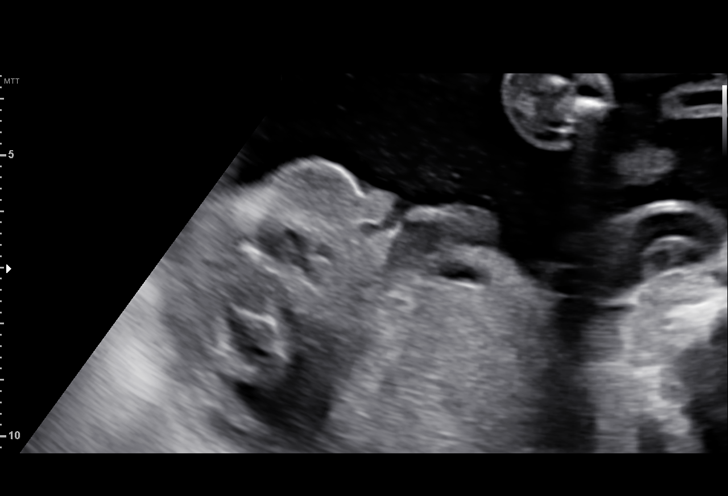

[13 of 28 positions shown; findings below may reference images not displayed]

[facility]


 ----------------------------------------------------------------------

 ----------------------------------------------------------------------
Indications

  Low lying placenta, antepartum
  Advanced maternal age multigravida 35+,
  second trimester
  23 weeks gestation of pregnancy
  Obesity complicating pregnancy, second
  trimester (BMI 32)
  Encounter for antenatal screening for
  malformations (low risk NIPS)
 ----------------------------------------------------------------------
Vital Signs

                                                Height:        5'6"
Fetal Evaluation

 Num Of Fetuses:         1
 Fetal Heart Rate(bpm):  151
 Cardiac Activity:       Observed
 Presentation:           Variable
 Placenta:               Anterior
 P. Cord Insertion:      Visualized

 Amniotic Fluid
 AFI FV:      Within normal limits

                             Largest Pocket(cm)

Biometry

 BPD:      55.2  mm     G. Age:  22w 6d         38  %    CI:        74.75   %    70 - 86
                                                         FL/HC:      19.8   %    19.2 -
 HC:      202.6  mm     G. Age:  22w 3d         15  %    HC/AC:      1.04        1.05 -
 AC:      194.8  mm     G. Age:  24w 1d         78  %    FL/BPD:     72.8   %    71 - 87
 FL:       40.2  mm     G. Age:  23w 0d         38  %    FL/AC:      20.6   %    20 - 24
 HUM:      38.4  mm     G. Age:  23w 4d         55  %
 CER:      25.5  mm     G. Age:  23w 4d         58  %

 LV:        3.7  mm

 Est. FW:     600  gm      1 lb 5 oz     67  %
OB History

 Blood Type:    O+
 Gravidity:    3         Term:   2
 Living:       2
Gestational Age

 LMP:           23w 0d        Date:  11/15/18                 EDD:   08/22/19
 U/S Today:     23w 1d                                        EDD:   08/21/19
 Best:          23w 0d     Det. By:  LMP  (11/15/18)          EDD:   08/22/19
Anatomy

 Cranium:               Appears normal         Aortic Arch:            Previously seen
 Cavum:                 Appears normal         Ductal Arch:            Previously seen
 Ventricles:            Appears normal         Diaphragm:              Previously seen
 Choroid Plexus:        Previously seen        Stomach:                Previously Seen
 Cerebellum:            Appears normal         Abdomen:                Previously seen
 Posterior Fossa:       Previously seen        Abdominal Wall:         Previously seen
 Nuchal Fold:           Previously seen        Cord Vessels:           Previously seen
 Face:                  Orbits and profile     Kidneys:                Appear normal
                        previously seen
 Lips:                  Previously seen        Bladder:                Appears normal
 Thoracic:              Previously seen        Spine:                  Limited views
                                                                       appear normal
 Heart:                 Appears normal         Upper Extremities:      Previously seen
                        (4CH, axis, and
                        situs)
 RVOT:                  Previously seen        Lower Extremities:      Previously seen
 LVOT:                  Previously seen

 Other:  Male gender previously seen. Heels and 5th digit previously seen.
         Technically difficult due to fetal position.
Cervix Uterus Adnexa

 Cervix
 Length:           3.12  cm.
 Normal appearance by transabdominal scan.

 Uterus
 No abnormality visualized.

 Left Ovary
 Within normal limits. No adnexal mass visualized.

 Right Ovary
 Within normal limits. No adnexal mass visualized.

 Cul De Sac
 No free fluid seen.
 Adnexa
 No abnormality visualized.
Comments

 This patient was seen for a follow up growth scan due to a
 low-lying placenta that was noted during her prior ultrasound
 exam.  She denies any problems since her last exam.
 She was informed that the fetal growth and amniotic fluid
 level appears appropriate for her gestational age.
 The previously noted placenta previa appears to have
 resolved.  She was advised to continue routine prenatal care.

 Follow-up as indicated.

## 2020-08-23 ENCOUNTER — Encounter: Payer: Self-pay | Admitting: Emergency Medicine

## 2020-08-23 ENCOUNTER — Other Ambulatory Visit: Payer: Self-pay

## 2020-08-23 ENCOUNTER — Ambulatory Visit
Admission: EM | Admit: 2020-08-23 | Discharge: 2020-08-23 | Disposition: A | Discharge status: Home / Self Care | Payer: Medicaid Other | Attending: Family Medicine | Admitting: Family Medicine

## 2020-08-23 DIAGNOSIS — Z113 Encounter for screening for infections with a predominantly sexual mode of transmission: Secondary | ICD-10-CM | POA: Diagnosis not present

## 2020-08-23 DIAGNOSIS — N939 Abnormal uterine and vaginal bleeding, unspecified: Secondary | ICD-10-CM | POA: Insufficient documentation

## 2020-08-23 LAB — POCT URINE PREGNANCY: Preg Test, Ur: NEGATIVE

## 2020-08-23 MED ORDER — MEGESTROL ACETATE 40 MG PO TABS
40.0000 mg | ORAL_TABLET | Freq: Every day | ORAL | 0 refills | Status: AC
Start: 2020-08-23 — End: 2020-09-06
  Filled 2020-08-23: qty 14, 14d supply, fill #0

## 2020-08-23 NOTE — ED Triage Notes (Signed)
Pt presents with irregular periods. States unable to get appt with GYN for weeks. States is under a lot of stress.

## 2020-08-23 NOTE — Discharge Instructions (Addendum)
We have sent testing for sexually transmitted infections. We will notify you of any positive results once they are received. If required, we will prescribe any medications you might need.  Please refrain from all sexual activity for at least the next seven days.  

## 2020-08-23 NOTE — ED Provider Notes (Addendum)
[facility]   [id number] 08/23/20 Arrival Time: 1018  ASSESSMENT & PLAN:  1. Screening for STDs (sexually transmitted diseases)   2. Vaginal bleeding    UPT negative.  Recommend:  Follow-up Information    Schedule an appointment as soon as possible for a visit  with [facility] for [facility].   Specialty: Obstetrics and Gynecology Contact information: [address] [place] [address] [phone]             Hold OCP. Begin: Meds ordered this encounter  Medications  . megestrol (MEGACE) 40 MG tablet    Sig: Take 1 tablet (40 mg total) by mouth daily for 14 days.    Dispense:  14 tablet    Refill:  0    Without s/s of PID.  Labs Reviewed  POCT URINE PREGNANCY  CERVICOVAGINAL ANCILLARY ONLY    Will notify of any positive results. Instructed to refrain from sexual activity for at least seven days.  Reviewed expectations re: course of current medical issues. Questions answered. Outlined signs and symptoms indicating need for more acute intervention. Patient verbalized understanding. After Visit Summary given.   SUBJECTIVE:  Barbara Waller is a 35 y.o. female who reports vaginal bleeding since last menstrual period; off/on over past week. Unable to get appt with gyn until 6/2 /22. Without abd/pelvic pain, nausea, vomiting, flank pain. Afebrile. Normal PO intake. On OCP. Requests STD screening. Recently broke up with boyfriend.  Patient's last menstrual period was 08/03/2020.   OBJECTIVE:  Vitals:   08/23/20 1029  BP: 106/74  Pulse: 83  Resp: 17  Temp: 98.3 F (36.8 C)  TempSrc: Oral  SpO2: 98%     General appearance: alert, cooperative, appears stated age and no distress Lungs: unlabored respirations; speaks full sentences without difficulty Back: no CVA tenderness; FROM at waist Abdomen: soft, non-tender GU: deferred Skin: warm and dry Psychological: alert and  cooperative; normal mood and affect.  Labs Reviewed  POCT URINE PREGNANCY  CERVICOVAGINAL ANCILLARY ONLY    Allergies  Allergen Reactions  . Ferrous Sulfate Swelling  . Shellfish Allergy Swelling    Past Medical History:  Diagnosis Date  . Anemia 08/04/2017  . BMI 32.0-32.9,adult 06/26/2015  . Breast cyst, left 10/02/2018  . Breast lump on left side at 6 o'clock position 10/21/2018  . Breast pain, left 10/02/2018  . Chronic midline low back pain without sciatica 05/16/2016  . Flu-like symptoms 05/25/2018  . Influenza B 05/25/2018  . Left breast abscess 06/26/2015  . 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 Obesity   . Sore throat 05/25/2018  . Vaginal Pap smear, abnormal   . Vitamin B12 deficiency 12/2019  . Vitamin B12 deficiency 11/2019  . Vitamin D deficiency 08/04/2017  . Vitamin D deficiency 11/2019   Family History  Problem Relation Age of Onset  . Diabetes Mother   . Hypertension Mother   . Diabetes Maternal Grandmother   . Diabetes Father   . Heart failure Father   . Anesthesia problems Neg Hx    Social History   Socioeconomic History  . Marital status: Divorced    Spouse name: Not on file  . Number of children: 2  . Years of education: Not on file  . Highest education level: Some college, no degree  Occupational History  . Not on file  Tobacco Use  . Smoking status: Never Smoker  . Smokeless tobacco:  Never Used  Vaping Use  . Vaping Use: Never used  Substance and Sexual Activity  . Alcohol use: No  . Drug use: No  . Sexual activity: Yes    Partners: Male  Other Topics Concern  . Not on file  Social History Narrative  . Not on file   Social Determinants of Health   Financial Resource Strain: Not on file  Food Insecurity: Not on file  Transportation Needs: Not on file  Physical Activity: Not on file  Stress: Not on file  Social Connections: Not on file  Intimate Partner Violence: Not on file           Mardella Layman, MD 08/23/20 1108    Mardella Layman, MD 08/23/20 1109

## 2020-08-24 ENCOUNTER — Other Ambulatory Visit: Payer: Self-pay

## 2020-08-24 ENCOUNTER — Telehealth (HOSPITAL_COMMUNITY): Payer: Self-pay | Admitting: Emergency Medicine

## 2020-08-24 LAB — CERVICOVAGINAL ANCILLARY ONLY
Bacterial Vaginitis (gardnerella): POSITIVE — AB
Candida Glabrata: NEGATIVE
Candida Vaginitis: NEGATIVE
Chlamydia: NEGATIVE
Comment: NEGATIVE
Comment: NEGATIVE
Comment: NEGATIVE
Comment: NEGATIVE
Comment: NEGATIVE
Comment: NORMAL
Neisseria Gonorrhea: NEGATIVE
Trichomonas: NEGATIVE

## 2020-08-24 MED ORDER — METRONIDAZOLE 500 MG PO TABS
500.0000 mg | ORAL_TABLET | Freq: Two times a day (BID) | ORAL | 0 refills | Status: DC
Start: 2020-08-24 — End: 2020-09-13
  Filled 2020-08-24: qty 14, 7d supply, fill #0

## 2020-08-28 ENCOUNTER — Other Ambulatory Visit: Payer: Self-pay

## 2020-09-13 ENCOUNTER — Encounter: Payer: Self-pay | Admitting: Family Medicine

## 2020-09-13 ENCOUNTER — Ambulatory Visit (INDEPENDENT_AMBULATORY_CARE_PROVIDER_SITE_OTHER): Payer: Medicaid Other | Admitting: Family Medicine

## 2020-09-13 ENCOUNTER — Other Ambulatory Visit: Payer: Self-pay

## 2020-09-13 VITALS — BP 119/81 | HR 76 | Wt 195.0 lb

## 2020-09-13 DIAGNOSIS — N939 Abnormal uterine and vaginal bleeding, unspecified: Secondary | ICD-10-CM

## 2020-09-13 NOTE — Progress Notes (Signed)
   GYNECOLOGY PROBLEM  VISIT ENCOUNTER NOTE  Subjective:   Barbara Waller is a 35 y.o. [phone] female here for a problem GYN visit.  Current complaints: vaginal bleeding.  Reports taking OCP but then had bleeding for 3 weeks.   Was seen at urgent care, given megace which stopped bleeding. Has not restarted the OCP  Denies abnormal vaginal bleeding, discharge, pelvic pain, problems with intercourse or other gynecologic concerns.    Gynecologic History No LMP recorded. Contraception: none  Health Maintenance Due  Topic Date Due  . COVID-19 Vaccine (1) Never done  . Hepatitis C Screening  Never done     The following portions of the patient's history were reviewed and updated as appropriate: allergies, current medications, past family history, past medical history, past social history, past surgical history and problem list.  Review of Systems Pertinent items are noted in HPI.   Objective:  BP 119/81   Pulse 76   Wt 195 lb (88.5 kg)   BMI 33.47 kg/m  Gen: well appearing, NAD HEENT: no scleral icterus CV: RR Lung: Normal WOB Ext: warm well perfused  PELVIC: no indicated   Assessment and Plan:  1. Abnormal uterine bleeding (AUB) Likely stress related Reviewed bleeding management with OCP or IUD Patient had sex 1 week ago, unprotected. Patient will start OCP today and check home UPT. Continue OCP until IUD insertion Desires LNG IUD  Please refer to After Visit Summary for other counseling recommendations.   Return in about 2 weeks (around 09/27/2020) for IUD insertion.  Federico Flake, MD, MPH, ABFM Attending Physician Faculty Practice- [facility] for [facility]

## 2020-09-13 NOTE — Progress Notes (Signed)
Bled for about 3 weeks, did go to urgent care, they had her stop OCP's and gave her megace for 2 weeks, which did stop bleeding, wants to discuss tubal again and birth control options

## 2020-10-01 ENCOUNTER — Encounter: Payer: Self-pay | Admitting: Obstetrics and Gynecology

## 2020-10-01 ENCOUNTER — Ambulatory Visit (INDEPENDENT_AMBULATORY_CARE_PROVIDER_SITE_OTHER): Payer: Medicaid Other | Admitting: Obstetrics and Gynecology

## 2020-10-01 ENCOUNTER — Other Ambulatory Visit: Payer: Self-pay

## 2020-10-01 VITALS — BP 140/85 | HR 78

## 2020-10-01 DIAGNOSIS — Z30430 Encounter for insertion of intrauterine contraceptive device: Secondary | ICD-10-CM

## 2020-10-01 DIAGNOSIS — Z3009 Encounter for other general counseling and advice on contraception: Secondary | ICD-10-CM | POA: Diagnosis not present

## 2020-10-01 DIAGNOSIS — Z01812 Encounter for preprocedural laboratory examination: Secondary | ICD-10-CM | POA: Diagnosis not present

## 2020-10-01 DIAGNOSIS — N926 Irregular menstruation, unspecified: Secondary | ICD-10-CM

## 2020-10-01 LAB — POCT URINE PREGNANCY: Preg Test, Ur: NEGATIVE

## 2020-10-01 MED ORDER — PARAGARD INTRAUTERINE COPPER IU IUD: INTRAUTERINE_SYSTEM | Freq: Once | INTRAUTERINE | Status: AC

## 2020-10-01 NOTE — Procedures (Signed)
Intrauterine Device (IUD) Insertion Procedure Note  After a recent pap (results: 01/2019/date: negative cytology and negative HPV) was confirmed, written consent was obtained; her urine pregnancy test was: negative with last sexual intercourse > 2 weeks ago. The patient understands the risks of IUD placement, which include but are not limited to: bleeding, infection, uterine perforation, risk of expulsion, risk of failure < 1%, increased risk of ectopic pregnancy in the event of failure.   Prior to the procedure being performed, the patient (or guardian) was asked to state their full name, date of birth, and the type of procedure being performed. A bimanual exam showed the uterus to be midposition.  Next, the cervix and vagina were cleaned with an antiseptic solution, and the cervix was grasped with a tenaculum.  The uterus was sounded to 8 cm.  The ParaGard was placed without difficulty in the usual fashion.  The strings were cut to 3-4 cm.  The tenaculum was removed and cervix was found to be hemostatic.    No complications, patient tolerated the procedure well.  Cornelia Copa MD Attending Center for [facility])

## 2020-10-01 NOTE — Progress Notes (Signed)
Obstetrics and Gynecology Visit Return Patient Evaluation  Appointment Date: 10/01/2020  OBGYN Clinic: [facility] for [facility]  Chief Complaint: contraception counseling  History of Present Illness:  Barbara Waller is a 35 y.o. G3P3 with above CC.  Patient last seen by Dr. Alvester Morin earlier this month and patient was leaning towards IUD placement  Interval History: Since that time, she states that she is wondering if IUD is the best option for her.  She has qmonth regular periods that last 7-8 days and are somewhat crampy and heavy but she did have a month that she had bleeding all month but her most recent period was normal.  She would like an option that doesn't have any hormone b/c she is worried about side effects like weight gain but she would like something with period control. She was on pills in the past but believes she may have difficulty remembering them b/c she got pregnant before on OCPs.   Review of Systems: as noted in the History of Present Illness.  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  Physical Exam:  BP 140/85   Pulse 78   LMP 09/22/2020 (Approximate)  There is no height or weight on file to calculate BMI. General appearance: Well nourished, well developed female in no acute distress.  Abdomen: diffusely non tender to palpation, non distended, and no masses, hernias Neuro/Psych:  Normal mood and affect.    Pelvic exam:  EGBUS, vaginal vault and cervix: within normal limits  See procedure note for uncomplicated Paragard insertion    Assessment: pt doing well  Plan:  1. Abnormal menstrual periods Long d/w patient re: options. She doesn't want any more children but wants to avoid surgery; vasectomy also d/w her.  She states she had a mirena in the past and liked it but had to have  it removed due to partner feeling the strings.  All the options were d/w her and she ultimately felt she'd rather risk potentially having repeat issues with her partner feeling the strings and worse periods than risk issues with hormones.  - POCT urine pregnancy  2. Encounter for other general counseling or advice on contraception  3. Encounter for insertion of ParaGard IUD   RTC: 1 month for string check  Cornelia Copa MD Attending Center for [facility])

## 2020-10-17 ENCOUNTER — Ambulatory Visit
Admission: EM | Admit: 2020-10-17 | Discharge: 2020-10-17 | Disposition: A | Discharge status: Home / Self Care | Payer: Medicaid Other | Attending: Internal Medicine | Admitting: Internal Medicine

## 2020-10-17 ENCOUNTER — Other Ambulatory Visit: Payer: Self-pay

## 2020-10-17 DIAGNOSIS — A084 Viral intestinal infection, unspecified: Secondary | ICD-10-CM | POA: Insufficient documentation

## 2020-10-17 DIAGNOSIS — R110 Nausea: Secondary | ICD-10-CM | POA: Insufficient documentation

## 2020-10-17 DIAGNOSIS — R197 Diarrhea, unspecified: Secondary | ICD-10-CM | POA: Diagnosis not present

## 2020-10-17 LAB — POCT RAPID STREP A (OFFICE): Rapid Strep A Screen: NEGATIVE

## 2020-10-17 NOTE — ED Triage Notes (Signed)
Pt present nausea and diarrhea with some dizziness. Symptom started Friday night.  Pt states that's on Thursday her daughter tested positive for covid.  The dizziness started on Monday. Pt states she was having some issues with blurred vision but once she sit back down and regain focus on her vision.

## 2020-10-17 NOTE — ED Provider Notes (Signed)
[name]    CSN: [id number] Arrival date & time: 10/17/20  7322      History   Chief Complaint Chief Complaint  Patient presents with   Dizziness   Emesis    HPI Barbara Waller is a 35 y.o. female.   Patient presents to the urgent care for nausea and diarrhea that started approximately 6 days ago after eating food that "did not taste right".  Denies any vomiting episodes.  Nausea and diarrhea were present for approximately 1 day and then resolved.  Then, patient went to the park and was walking around and states that she felt dizzy and lightheaded with "spots in her eyes".  Patient states that she sat down for a little while and then got into the car with the air conditioning and felt much better.  Patient denies any nausea, dizziness, or any episodes where she feels like she is going to pass out since that 1 episode approximately 2 days ago.  Denies diarrhea currently but states that she is having soft stools approximately 3-4 times a day.  Stool has began to harden and improve.  Denies any current dizziness or nausea. Patient's daughter tested positive for Covid 19 one day prior to patient's symptoms starting. Denies any upper respiratory symptoms.    Dizziness Emesis  Past Medical History:  Diagnosis Date   Anemia 08/04/2017   BMI 32.0-32.9,adult 06/26/2015   Breast cyst, left 10/02/2018   Breast lump on left side at 6 o'clock position 10/21/2018   Breast pain, left 10/02/2018   Chronic midline low back pain without sciatica 05/16/2016   Flu-like symptoms 05/25/2018   Influenza B 05/25/2018   Left breast abscess 06/26/2015   LSIL (low grade squamous intraepithelial lesion) on Pap smear 08/21/2009   ABNORMAL PAP   Obesity    Sore throat 05/25/2018   Vaginal Pap smear, abnormal    Vitamin B12 deficiency 11/2019   Vitamin D deficiency 08/04/2017    Patient Active Problem List   Diagnosis Date Noted   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 Authorizing Provider  acetaminophen (TYLENOL) 325 MG tablet Take 2 tablets (650 mg total) by mouth every 6 (six) hours as needed (for pain scale < 4). 08/19/19   Venora Maples, MD  [facility] INTRAUTERINE COPPER IU by Intrauterine route. 10/01/20   [provider]  norethindrone (ORTHO MICRONOR) 0.35 MG tablet Take 1 tablet (0.35 mg total) by mouth daily. Patient not taking: Reported on 09/14/2019 08/19/19 08/23/20  Venora Maples, MD  norethindrone-ethinyl estradiol (NECON) 0.5-35 MG-MCG tablet Take 1 tablet by mouth daily. 09/14/19 08/23/20  Federico Flake, MD    Family History Family History  Problem Relation Age of Onset   Diabetes Mother    Hypertension Mother  Diabetes Maternal Grandmother    Diabetes Father    Heart failure Father    Anesthesia problems Neg Hx     Social History Social History   Tobacco Use   Smoking status: Never   Smokeless tobacco: Never  Vaping Use   Vaping Use: Never used  Substance Use Topics   Alcohol use: No   Drug use: No     Allergies   Ferrous sulfate and Shellfish allergy   Review of Systems Review of Systems Per HPI  Physical Exam Triage Vital Signs ED Triage Vitals  Enc Vitals Group     BP 10/17/20 0853 107/77     Pulse Rate 10/17/20 0853 87     Resp 10/17/20 0853 16     Temp 10/17/20 0853 98.3 F (36.8 C)     Temp Source 10/17/20 0853 Oral     SpO2 10/17/20 0853 98 %     Weight --      Height --      Head Circumference --      Peak Flow --      Pain Score 10/17/20 0854 0     Pain Loc --      Pain Edu? --      Excl. in  GC? --    No data found.  Updated Vital Signs BP 107/77 (BP Location: Left Arm)   Pulse 87   Temp 98.3 F (36.8 C) (Oral)   Resp 16   LMP 10/14/2020 (Approximate)   SpO2 98%   Visual Acuity Right Eye Distance:   Left Eye Distance:   Bilateral Distance:    Right Eye Near:   Left Eye Near:    Bilateral Near:     Physical Exam Constitutional:      General: She is not in acute distress.    Appearance: Normal appearance.  HENT:     Head: Normocephalic and atraumatic.     Nose: No congestion.     Mouth/Throat:     Mouth: Mucous membranes are moist.  Eyes:     Extraocular Movements: Extraocular movements intact.     Conjunctiva/sclera: Conjunctivae normal.  Cardiovascular:     Rate and Rhythm: Normal rate and regular rhythm.     Pulses: Normal pulses.     Heart sounds: Normal heart sounds.  Pulmonary:     Effort: Pulmonary effort is normal.     Breath sounds: Normal breath sounds.  Abdominal:     General: Abdomen is flat. Bowel sounds are normal. There is no distension.     Palpations: Abdomen is soft.     Tenderness: There is no abdominal tenderness.  Skin:    General: Skin is warm and dry.  Neurological:     General: No focal deficit present.     Mental Status: She is alert and oriented to person, place, and time. Mental status is at baseline.     Cranial Nerves: Cranial nerves are intact.     Sensory: Sensation is intact.     Motor: Motor function is intact.     Coordination: Coordination is intact.  Psychiatric:        Mood and Affect: Mood normal.        Behavior: Behavior normal.        Thought Content: Thought content normal.        Judgment: Judgment normal.     UC Treatments / Results  Labs (all labs ordered are listed, but only abnormal results are displayed) Labs Reviewed  NOVEL CORONAVIRUS, NAA  CULTURE, GROUP A STREP [facility])  POCT RAPID STREP A (OFFICE)    EKG   Radiology No results found.  Procedures Procedures (including critical  care time)  Medications Ordered in UC Medications - No data to display  Initial Impression / Assessment and Plan / UC Course  I have reviewed the triage vital signs and the nursing notes.  Pertinent labs & imaging results that were available during my care of the patient were reviewed by me and considered in my medical decision making (see chart for details).     Clinical signs and symptoms are most consistent with dehydration after being in hot sun at park directly after having consistent diarrhea and nausea.  Symptoms have now resolved.  Patient advised to return for follow-up if symptoms return.  Patient to increase clear oral fluids.  Discussed over-the-counter options to relieve diarrhea.  Discussed strict return precautions. Patient verbalized understanding and is agreeable with plan.   COVID-19 viral swab pending.  Performed test for strep throat due to some variants of strep throat causing gastrointestinal symptoms.  Rapid strep test was negative in office.  Throat culture pending. Final Clinical Impressions(s) / UC Diagnoses   Final diagnoses:  Nausea without vomiting  Diarrhea, unspecified type  Viral enteritis     Discharge Instructions      It seems that your symptoms could have resulted from a stomach virus or food poisoning.  The dehydration that was caused by diarrhea seem to have caused your dizziness and lightheadedness that occurred a few days ago.  Please continue to stress intake of clear fluids.  Please follow-up if symptoms return.  Request for assistance to find primary care physician for you has been started.  Someone will reach out to you to help you with finding a primary care physician in making an appointment.     ED Prescriptions   None    PDMP not reviewed this encounter.   Lance Muss, FNP 10/17/20 [phone]

## 2020-10-17 NOTE — Discharge Instructions (Signed)
It seems that your symptoms could have resulted from a stomach virus or food poisoning.  The dehydration that was caused by diarrhea seem to have caused your dizziness and lightheadedness that occurred a few days ago.  Please continue to stress intake of clear fluids.  Please follow-up if symptoms return.  Request for assistance to find primary care physician for you has been started.  Someone will reach out to you to help you with finding a primary care physician in making an appointment.

## 2020-10-18 LAB — NOVEL CORONAVIRUS, NAA: SARS-CoV-2, NAA: NOT DETECTED

## 2020-10-18 LAB — SARS-COV-2, NAA 2 DAY TAT

## 2020-10-20 LAB — CULTURE, GROUP A STREP (THRC)

## 2020-11-02 ENCOUNTER — Other Ambulatory Visit: Payer: Self-pay

## 2020-11-02 ENCOUNTER — Ambulatory Visit
Admission: EM | Admit: 2020-11-02 | Discharge: 2020-11-02 | Disposition: A | Discharge status: Home / Self Care | Payer: Medicaid Other | Attending: Emergency Medicine | Admitting: Emergency Medicine

## 2020-11-02 ENCOUNTER — Encounter: Payer: Self-pay | Admitting: Emergency Medicine

## 2020-11-02 DIAGNOSIS — N939 Abnormal uterine and vaginal bleeding, unspecified: Secondary | ICD-10-CM | POA: Diagnosis not present

## 2020-11-02 MED ORDER — MEGESTROL ACETATE 40 MG PO TABS
ORAL_TABLET | ORAL | 0 refills | Status: DC
  Filled 2020-11-02: qty 7, 7d supply, fill #0

## 2020-11-02 MED ORDER — MEGESTROL ACETATE 40 MG PO TABS: 40.0000 mg | ORAL_TABLET | Freq: Every day | ORAL | 0 refills | Status: AC

## 2020-11-02 NOTE — ED Provider Notes (Signed)
[facility]    CSN: [id number] Arrival date & time: 11/02/20  1417      History   Chief Complaint Chief Complaint  Patient presents with   Vaginal Bleeding    HPI Devyn Griffing is a 35 y.o. female presenting today for evaluation of prolonged vaginal bleeding. Patient was seen by OB/GYN on 10/01/2020.  Had copper IUD placed.  Reports had normal menstrual cycle couple days later, but over the past 2 weeks has had light menstrual bleeding.  Denies any significant cramping at this time.  History of prior abnormal/prolonged bleeding prior to insertion of IUD.  Believes this may have been related to stress.  Denies any vaginal symptoms of discharge itching or irritation.  She denies any new partners or concerns for STDs.  HPI  Past Medical History:  Diagnosis Date   Anemia 08/04/2017   BMI 32.0-32.9,adult 06/26/2015   Breast cyst, left 10/02/2018   Breast lump on left side at 6 o'clock position 10/21/2018   Breast pain, left 10/02/2018   Chronic midline low back pain without sciatica 05/16/2016   Flu-like symptoms 05/25/2018   Influenza B 05/25/2018   Left breast abscess 06/26/2015   LSIL (low grade squamous intraepithelial lesion) on Pap smear 08/21/2009   ABNORMAL PAP   Obesity    Sore throat 05/25/2018   Vaginal Pap smear, abnormal    Vitamin B12 deficiency 11/2019   Vitamin D deficiency 08/04/2017    Patient Active Problem List   Diagnosis Date Noted   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 Authorizing Provider  megestrol (MEGACE) 40 MG tablet Take 1 tablet (40 mg  total) by mouth daily for 7 days. 11/02/20 11/09/20 Yes Laisha Rau C, PA-C  acetaminophen (TYLENOL) 325 MG tablet Take 2 tablets (650 mg total) by mouth every 6 (six) hours as needed (for pain scale < 4). 08/19/19   Venora Maples, MD  [facility] INTRAUTERINE COPPER IU by Intrauterine route. 10/01/20   [provider]  norethindrone (ORTHO MICRONOR) 0.35 MG tablet Take 1 tablet (0.35 mg total) by mouth daily. Patient not taking: Reported on 09/14/2019 08/19/19 08/23/20  Venora Maples, MD  norethindrone-ethinyl estradiol (NECON) 0.5-35 MG-MCG tablet Take 1 tablet by mouth daily. 09/14/19 08/23/20  Federico Flake, MD    Family History Family History  Problem Relation Age of Onset   Diabetes Mother    Hypertension Mother    Diabetes Maternal Grandmother    Diabetes Father    Heart failure Father    Anesthesia problems Neg Hx     Social History Social History   Tobacco Use   Smoking status: Never   Smokeless tobacco: Never  Vaping Use   Vaping Use: Never used  Substance Use Topics   Alcohol use: No  Drug use: No     Allergies   Ferrous sulfate and Shellfish allergy   Review of Systems Review of Systems  Constitutional:  Negative for fever.  Respiratory:  Negative for shortness of breath.   Cardiovascular:  Negative for chest pain.  Gastrointestinal:  Negative for abdominal pain, diarrhea, nausea and vomiting.  Genitourinary:  Positive for vaginal bleeding. Negative for dysuria, flank pain, genital sores, hematuria, menstrual problem, vaginal discharge and vaginal pain.  Musculoskeletal:  Negative for back pain.  Skin:  Negative for rash.  Neurological:  Negative for dizziness, light-headedness and headaches.    Physical Exam Triage Vital Signs ED Triage Vitals  Enc Vitals Group     BP      Pulse      Resp      Temp      Temp src      SpO2      Weight      Height      Head Circumference      Peak Flow      Pain Score      Pain Loc      Pain  Edu?      Excl. in GC?    No data found.  Updated Vital Signs BP 124/84 (BP Location: Right Arm)   Pulse 65   Temp 99.1 F (37.3 C) (Oral)   Resp 16   Wt 200 lb (90.7 kg)   LMP 10/14/2020 (Approximate)   SpO2 98%   BMI 34.33 kg/m   Visual Acuity Right Eye Distance:   Left Eye Distance:   Bilateral Distance:    Right Eye Near:   Left Eye Near:    Bilateral Near:     Physical Exam Vitals and nursing note reviewed.  Constitutional:      Appearance: She is well-developed.     Comments: No acute distress  HENT:     Head: Normocephalic and atraumatic.     Nose: Nose normal.  Eyes:     Conjunctiva/sclera: Conjunctivae normal.  Cardiovascular:     Rate and Rhythm: Normal rate.  Pulmonary:     Effort: Pulmonary effort is normal. No respiratory distress.  Abdominal:     General: There is no distension.  Musculoskeletal:        General: Normal range of motion.     Cervical back: Neck supple.  Skin:    General: Skin is warm and dry.  Neurological:     Mental Status: She is alert and oriented to person, place, and time.     UC Treatments / Results  Labs (all labs ordered are listed, but only abnormal results are displayed) Labs Reviewed - No data to display  EKG   Radiology No results found.  Procedures Procedures (including critical care time)  Medications Ordered in UC Medications - No data to display  Initial Impression / Assessment and Plan / UC Course  I have reviewed the triage vital signs and the nursing notes.  Pertinent labs & imaging results that were available during my care of the patient were reviewed by me and considered in my medical decision making (see chart for details).     Prolonged vaginal bleeding-x2 weeks, light in nature, discussed with patient may be normal for the first 3 months to have slightly heavier prolonged bleeding with copper IUD, encouraged use of NSAIDs as needed, patient requesting Megace as this is helped her in the  past with prolonged bleeding.  Encourage patient to continue to monitor  for now, did provide printed prescription if symptoms continuing to persist for another 1 to 2 weeks, encourage patient to hold off on this if possible given her new birth control, cycle light in nature and currently asymptomatic.  Discussed strict return precautions. Patient verbalized understanding and is agreeable with plan.  Final Clinical Impressions(s) / UC Diagnoses   Final diagnoses:  Vaginal bleeding     Discharge Instructions      May be normal to have heavier/longer cycles for the first few menstrual cycles from the copper IUD May use Aleve/ibuprofen Megace only if symptoms continuing to persist Please follow-up with OB/GYN     ED Prescriptions     Medication Sig Dispense Auth. Provider   megestrol (MEGACE) 40 MG tablet Take 1 tablet (40 mg total) by mouth daily for 7 days. 7 tablet Brena [place], [place] C, PA-C      PDMP not reviewed this encounter.   Lew Dawes, [place] 11/02/20 1524

## 2020-11-02 NOTE — Discharge Instructions (Addendum)
May be normal to have heavier/longer cycles for the first few menstrual cycles from the copper IUD May use Aleve/ibuprofen Megace only if symptoms continuing to persist Please follow-up with OB/GYN

## 2020-11-02 NOTE — ED Triage Notes (Signed)
Vaginal bleeding for almost 2 weeks. Copper IUD inserted end of last month.

## 2020-11-05 ENCOUNTER — Other Ambulatory Visit: Payer: Self-pay

## 2020-11-05 ENCOUNTER — Ambulatory Visit
Admission: EM | Admit: 2020-11-05 | Discharge: 2020-11-05 | Disposition: A | Discharge status: Home / Self Care | Payer: Medicaid Other | Attending: Family Medicine | Admitting: Family Medicine

## 2020-11-05 DIAGNOSIS — Z20822 Contact with and (suspected) exposure to covid-19: Secondary | ICD-10-CM | POA: Diagnosis not present

## 2020-11-05 DIAGNOSIS — J039 Acute tonsillitis, unspecified: Secondary | ICD-10-CM

## 2020-11-05 LAB — POCT MONO SCREEN (KUC): Mono, POC: NEGATIVE

## 2020-11-05 LAB — POCT RAPID STREP A (OFFICE): Rapid Strep A Screen: NEGATIVE

## 2020-11-05 MED ORDER — PREDNISONE 20 MG PO TABS
40.0000 mg | ORAL_TABLET | Freq: Every day | ORAL | 0 refills | Status: DC
  Filled 2020-11-05: qty 6, 3d supply, fill #0

## 2020-11-05 MED ORDER — LIDOCAINE VISCOUS HCL 2 % MT SOLN
10.0000 mL | OROMUCOSAL | 0 refills | Status: DC | PRN
  Filled 2020-11-05: qty 100, 10d supply, fill #0

## 2020-11-05 MED ORDER — AMOXICILLIN 875 MG PO TABS
875.0000 mg | ORAL_TABLET | Freq: Two times a day (BID) | ORAL | 0 refills | Status: DC
  Filled 2020-11-05: qty 20, 10d supply, fill #0

## 2020-11-05 NOTE — ED Triage Notes (Signed)
Pt c/o sore throat since yesterday morning. Hx of strep and feels the same.

## 2020-11-05 NOTE — ED Provider Notes (Signed)
[facility]    CSN: [id number] Arrival date & time: 11/05/20  3664      History   Chief Complaint Chief Complaint  Patient presents with   Sore Throat    HPI Barbara Waller is a 35 y.o. female.   Patient presenting today with 2-day history of worsening sore, swollen throat.  Denies known fever, chills, body aches, fatigue, congestion, cough, chest pain, shortness of breath, abdominal pain, nausea vomiting or diarrhea.  States she has been taking ibuprofen with mild temporary relief of symptoms.  Drinking hot teas as well with mild temporary relief.  States her tonsils are very large at baseline but tend to get larger like this when she has strep infections.  Boyfriend was sick last week but unclear with what.   Past Medical History:  Diagnosis Date   Anemia 08/04/2017   BMI 32.0-32.9,adult 06/26/2015   Breast cyst, left 10/02/2018   Breast lump on left side at 6 o'clock position 10/21/2018   Breast pain, left 10/02/2018   Chronic midline low back pain without sciatica 05/16/2016   Flu-like symptoms 05/25/2018   Influenza B 05/25/2018   Left breast abscess 06/26/2015   LSIL (low grade squamous intraepithelial lesion) on Pap smear 08/21/2009   ABNORMAL PAP   Obesity    Sore throat 05/25/2018   Vaginal Pap smear, abnormal    Vitamin B12 deficiency 11/2019   Vitamin D deficiency 08/04/2017    Patient Active Problem List   Diagnosis Date Noted   Abnormal uterine bleeding (AUB) 11/08/2020   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 Authorizing Provider  amoxicillin  (AMOXIL) 875 MG tablet Take 1 tablet (875 mg total) by mouth 2 (two) times daily. 11/05/20  Yes Particia Nearing, PA-C  lidocaine (XYLOCAINE) 2 % solution Use as directed 10 mLs in the mouth or throat as needed for mouth pain. 11/05/20  Yes Particia Nearing, PA-C  predniSONE (DELTASONE) 20 MG tablet Take 2 tablets (40 mg total) by mouth daily with breakfast. 11/05/20  Yes Particia Nearing, PA-C  acetaminophen (TYLENOL) 325 MG tablet Take 2 tablets (650 mg total) by mouth every 6 (six) hours as needed (for pain scale < 4). 08/19/19   Venora Maples, MD  megestrol (MEGACE) 40 MG tablet Take 1 tablet (40 mg total) by mouth daily for 7 days. 11/02/20 11/09/20  Wieters, Hallie C, PA-C  megestrol (MEGACE) 40 MG tablet Take 1 tablet by mouth daily for 7 days. 11/02/20   LampteyBritta Mccreedy, MD  PARAGARD INTRAUTERINE COPPER IU by Intrauterine route. 10/01/20   [provider]  tranexamic acid (LYSTEDA) 650 MG TABS tablet Take 2 tablets (1,300 mg total) by mouth 3 (three) times daily. Take during menses for a maximum of five days 11/08/20  Garrison Bing, MD  norethindrone (ORTHO MICRONOR) 0.35 MG tablet Take 1 tablet (0.35 mg total) by mouth daily. Patient not taking: Reported on 09/14/2019 08/19/19 08/23/20  Venora Maples, MD  norethindrone-ethinyl estradiol (NECON) 0.5-35 MG-MCG tablet Take 1 tablet by mouth daily. 09/14/19 08/23/20  Federico Flake, MD    Family History Family History  Problem Relation Age of Onset   Diabetes Mother    Hypertension Mother    Diabetes Maternal Grandmother    Diabetes Father    Heart failure Father    Anesthesia problems Neg Hx     Social History Social History   Tobacco Use   Smoking status: Never   Smokeless tobacco: Never  Vaping Use   Vaping Use: Never used  Substance Use Topics   Alcohol use: No   Drug use: No     Allergies   Ferrous sulfate and Shellfish allergy   Review of Systems Review of Systems Per  HPI  Physical Exam Triage Vital Signs ED Triage Vitals  Enc Vitals Group     BP 11/05/20 1012 114/78     Pulse Rate 11/05/20 1012 81     Resp 11/05/20 1012 18     Temp 11/05/20 1012 99.1 F (37.3 C)     Temp Source 11/05/20 1012 Oral     SpO2 11/05/20 1012 98 %     Weight --      Height --      Head Circumference --      Peak Flow --      Pain Score 11/05/20 1013 3     Pain Loc --      Pain Edu? --      Excl. in GC? --    No data found.  Updated Vital Signs BP 114/78 (BP Location: Left Arm)   Pulse 81   Temp 99.1 F (37.3 C) (Oral)   Resp 18   LMP 10/14/2020 (Approximate)   SpO2 98%   Breastfeeding No   Visual Acuity Right Eye Distance:   Left Eye Distance:   Bilateral Distance:    Right Eye Near:   Left Eye Near:    Bilateral Near:     Physical Exam Vitals and nursing note reviewed.  Constitutional:      Appearance: Normal appearance. She is not ill-appearing.  HENT:     Head: Atraumatic.     Right Ear: Tympanic membrane normal.     Left Ear: Tympanic membrane normal.     Nose: Nose normal.     Mouth/Throat:     Mouth: Mucous membranes are moist.     Pharynx: Oropharyngeal exudate present.     Comments: Right tonsillar exudates, significant bilateral tonsillar edema without significant erythema.  Uvula midline.  Unclear what her baseline tonsillar exam appears like but patient states they are always significantly swollen Eyes:     Extraocular Movements: Extraocular movements intact.     Conjunctiva/sclera: Conjunctivae normal.  Cardiovascular:     Rate and Rhythm: Normal rate and regular rhythm.     Heart sounds: Normal heart sounds.  Pulmonary:     Effort: Pulmonary effort is normal.     Breath sounds: Normal breath sounds.  Abdominal:     General: Bowel sounds are normal. There is no distension.     Palpations: Abdomen is soft.     Tenderness: There is no abdominal tenderness. There is no guarding.  Musculoskeletal:        General: Normal range  of motion.  Cervical back: Normal range of motion and neck supple.  Skin:    General: Skin is warm and dry.  Neurological:     Mental Status: She is alert and oriented to person, place, and time.  Psychiatric:        Mood and Affect: Mood normal.        Thought Content: Thought content normal.        Judgment: Judgment normal.     UC Treatments / Results  Labs (all labs ordered are listed, but only abnormal results are displayed) Labs Reviewed  NOVEL CORONAVIRUS, NAA   Narrative:    Performed at:  [address], [place], [place]  [id number] Lab Director: Jolene Schimke MD, Phone:  [phone]  CULTURE, GROUP A STREP [facility])  SARS-COV-2, NAA 2 DAY TAT   Narrative:    Performed at:  [address] [place], [place]  [id number] Lab Director: Jolene Schimke MD, Phone:  [phone]  POCT RAPID STREP A (OFFICE)  POCT MONO SCREEN [facility])    EKG   Radiology No results found.  Procedures Procedures (including critical care time)  Medications Ordered in UC Medications - No data to display  Initial Impression / Assessment and Plan / UC Course  I have reviewed the triage vital signs and the nursing notes.  Pertinent labs & imaging results that were available during my care of the patient were reviewed by me and considered in my medical decision making (see chart for details).     Vitals and exam overall reassuring aside from significant tonsillar edema though she states her baseline is about this extent of edema. She is in no distress, tolerating PO and breathing without issue. Rapid strep and mono neg, throat culture and COVID testing pending. Will cover for bacterial infection given appearance and severity of throat sxs with amoxil in addition to prednisone and viscous lidocaine for symptomatic benefit. Strict ED precautions given for acutely worsening sxs.  Final Clinical Impressions(s) / UC Diagnoses   Final diagnoses:   Encounter for screening laboratory testing for COVID-19 virus  Acute tonsillitis, unspecified etiology   Discharge Instructions   None    ED Prescriptions     Medication Sig Dispense Auth. Provider   predniSONE (DELTASONE) 20 MG tablet Take 2 tablets (40 mg total) by mouth daily with breakfast. 6 tablet Particia Nearing, PA-C   lidocaine (XYLOCAINE) 2 % solution Use as directed 10 mLs in the mouth or throat as needed for mouth pain. 100 mL Particia Nearing, PA-C   amoxicillin (AMOXIL) 875 MG tablet Take 1 tablet (875 mg total) by mouth 2 (two) times daily. 20 tablet Particia Nearing, [place]      PDMP not reviewed this encounter.   Roosvelt Maser [place], [place] 11/09/20 [phone]

## 2020-11-06 LAB — NOVEL CORONAVIRUS, NAA: SARS-CoV-2, NAA: NOT DETECTED

## 2020-11-06 LAB — SARS-COV-2, NAA 2 DAY TAT

## 2020-11-08 ENCOUNTER — Ambulatory Visit (INDEPENDENT_AMBULATORY_CARE_PROVIDER_SITE_OTHER): Payer: Medicaid Other | Admitting: Obstetrics and Gynecology

## 2020-11-08 ENCOUNTER — Other Ambulatory Visit: Payer: Self-pay

## 2020-11-08 ENCOUNTER — Encounter: Payer: Self-pay | Admitting: Obstetrics and Gynecology

## 2020-11-08 VITALS — BP 119/81 | HR 72

## 2020-11-08 DIAGNOSIS — N926 Irregular menstruation, unspecified: Secondary | ICD-10-CM

## 2020-11-08 DIAGNOSIS — N939 Abnormal uterine and vaginal bleeding, unspecified: Secondary | ICD-10-CM | POA: Diagnosis not present

## 2020-11-08 LAB — CULTURE, GROUP A STREP (THRC)

## 2020-11-08 LAB — POCT URINE PREGNANCY: Preg Test, Ur: NEGATIVE

## 2020-11-08 MED ORDER — TRANEXAMIC ACID 650 MG PO TABS
1300.0000 mg | ORAL_TABLET | Freq: Three times a day (TID) | ORAL | 2 refills | Status: DC
  Filled 2020-11-08: qty 30, 5d supply, fill #0
  Filled 2020-11-14: qty 30, 5d supply, fill #1
  Filled 2020-12-03: qty 30, 5d supply, fill #2

## 2020-11-08 NOTE — Progress Notes (Signed)
    Obstetrics and Gynecology Visit Return Patient Evaluation  Appointment Date: 11/08/2020   OBGYN Clinic: [facility]  Chief Complaint: AUB with paragard in place  History of Present Illness:  Barbara Waller is a 35 y.o. P3 s/p paragard IUD placement on 6/20.  She states that she a cycle about a week after paragard was placed and it was a few days and normal but then about two weeks ago she had what she thought was a period and bleeding ever since. She went to [facility] on 7/22 for VB and put on megace which she has been taking sporadically. Bleeding is needing a pad at times.    Review of Systems:  as noted in the History of Present Illness.   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 Roseanne Reno "Kurt Azimi" had no medications administered during this visit. Current Outpatient Medications  Medication Sig Dispense Refill   acetaminophen (TYLENOL) 325 MG tablet Take 2 tablets (650 mg total) by mouth every 6 (six) hours as needed (for pain scale < 4). 30 tablet 0   amoxicillin (AMOXIL) 875 MG tablet Take 1 tablet (875 mg total) by mouth 2 (two) times daily. 20 tablet 0   lidocaine (XYLOCAINE) 2 % solution Use as directed 10 mLs in the mouth or throat as needed for mouth pain. 100 mL 0   megestrol (MEGACE) 40 MG tablet Take 1 tablet (40 mg total) by mouth daily for 7 days. 7 tablet 0   megestrol (MEGACE) 40 MG tablet Take 1 tablet by mouth daily for 7 days. 7 tablet 0   PARAGARD INTRAUTERINE COPPER IU by Intrauterine route.     predniSONE (DELTASONE) 20 MG tablet Take 2 tablets (40 mg total) by mouth daily with breakfast. 6 tablet 0   No current facility-administered medications for this visit.    Allergies: is allergic to ferrous sulfate and shellfish allergy.  Physical Exam:  BP 119/81   Pulse 72   LMP 10/14/2020 (Approximate)  There is no height or  weight on file to calculate BMI. General appearance: Well nourished, well developed female in no acute distress.  Abdomen: diffusely non tender to palpation, non distended, and no masses, hernias Neuro/Psych:  Normal mood and affect.    Pelvic exam:  EGBUS normal Vaginal vault with old blood, approximately 5-45mL, no active bleeding Cervix: normal with IUD strings approx 3-4cm in length Uterus: nttp   Labs:  UPT neg  Assessment: pt stable  Plan:  1. AUB I told her I recommend Lysteda course and if that doesn't work a month or two of OCPs. If medical options don't work, then I told her I'd recommend removing the IUD; pt to call and let us know if Lysteda course doesn't stop her bleeding and she does not go back to a qmonth regular cycle.    RTC: PRN  Cornelia Copa MD Attending [facility])

## 2020-11-08 NOTE — Progress Notes (Signed)
Having heavy bleeding

## 2020-11-09 ENCOUNTER — Other Ambulatory Visit: Payer: Self-pay

## 2020-11-14 ENCOUNTER — Other Ambulatory Visit: Payer: Self-pay

## 2020-11-15 ENCOUNTER — Other Ambulatory Visit: Payer: Self-pay

## 2020-11-16 ENCOUNTER — Other Ambulatory Visit: Payer: Self-pay

## 2020-12-03 ENCOUNTER — Other Ambulatory Visit: Payer: Self-pay

## 2020-12-25 ENCOUNTER — Other Ambulatory Visit: Payer: Self-pay | Admitting: Obstetrics and Gynecology

## 2020-12-25 ENCOUNTER — Other Ambulatory Visit: Payer: Self-pay

## 2021-01-09 ENCOUNTER — Other Ambulatory Visit: Payer: Self-pay

## 2021-01-09 ENCOUNTER — Other Ambulatory Visit: Payer: Self-pay | Admitting: Obstetrics and Gynecology

## 2021-01-16 ENCOUNTER — Other Ambulatory Visit: Payer: Self-pay

## 2021-01-16 ENCOUNTER — Other Ambulatory Visit: Payer: Self-pay | Admitting: Obstetrics and Gynecology

## 2021-01-28 ENCOUNTER — Other Ambulatory Visit: Payer: Self-pay | Admitting: Obstetrics and Gynecology

## 2021-01-28 ENCOUNTER — Other Ambulatory Visit: Payer: Self-pay

## 2021-04-01 ENCOUNTER — Other Ambulatory Visit: Payer: Self-pay

## 2021-05-08 ENCOUNTER — Telehealth: Payer: Medicaid Other | Admitting: Nurse Practitioner

## 2021-05-08 ENCOUNTER — Telehealth: Payer: Medicaid Other

## 2021-05-08 DIAGNOSIS — J069 Acute upper respiratory infection, unspecified: Secondary | ICD-10-CM | POA: Diagnosis not present

## 2021-05-08 NOTE — Progress Notes (Signed)
Virtual Visit Consent   Barbara Waller, you are scheduled for a virtual visit with a [facility] provider today.     Just as with appointments in the office, your consent must be obtained to participate.  Your consent will be active for this visit and any virtual visit you may have with one of our providers in the next 365 days.     If you have a MyChart account, a copy of this consent can be sent to you electronically.  All virtual visits are billed to your insurance company just like a traditional visit in the office.    As this is a virtual visit, video technology does not allow for your provider to perform a traditional examination.  This may limit your provider's ability to fully assess your condition.  If your provider identifies any concerns that need to be evaluated in person or the need to arrange testing (such as labs, EKG, etc.), we will make arrangements to do so.     Although advances in technology are sophisticated, we cannot ensure that it will always work on either your end or our end.  If the connection with a video visit is poor, the visit may have to be switched to a telephone visit.  With either a video or telephone visit, we are not always able to ensure that we have a secure connection.     I need to obtain your verbal consent now.   Are you willing to proceed with your visit today?    Barbara Waller has provided verbal consent on 05/08/2021 for a virtual visit (video or telephone).   Viviano Simas, FNP   Date: 05/08/2021 6:58 PM   Virtual Visit via Video Note   I, Viviano Simas, connected with  Barbara Waller  ([id number], Jan 10, 1986) on 05/08/21 at  7:00 PM EST by a video-enabled telemedicine application and verified that I am speaking with the correct person using two identifiers.  Location: Patient: Virtual Visit Location Patient: Home Provider: Virtual Visit Location Provider: Home Office   I discussed the limitations of evaluation and  management by telemedicine and the availability of in person appointments. The patient expressed understanding and agreed to proceed.    History of Present Illness: Barbara Waller is a 36 y.o. who identifies as a female who was assigned female at birth, and is being seen today for ongoing scratchy throat for the past 4 days. She has not had a fever. She does have a cough.   She denies a history of asthma   She has taken two COVID tests and they were both negative.   She was in [place] over the past weekend when symptoms started  She has been taking Alka Seltzer OTC col/flu for relief. This is helping her.  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   Prediabetes 01/03/2016    Allergies:  Allergies  Allergen Reactions   Ferrous Sulfate Swelling   Shellfish Allergy Swelling   Medications:  Current Outpatient Medications:    acetaminophen (TYLENOL) 325 MG tablet, Take 2 tablets (650 mg total) by mouth every 6 (six) hours as needed (for pain scale < 4)., Disp: 30 tablet, Rfl: 0   amoxicillin (AMOXIL) 875 MG tablet, Take 1 tablet (875 mg total) by mouth 2 (two) times daily., Disp: 20 tablet, Rfl: 0  lidocaine (XYLOCAINE) 2 % solution, Use as directed 10 mLs in the mouth or throat as needed for mouth pain., Disp: 100 mL, Rfl: 0   megestrol (MEGACE) 40 MG tablet, Take 1 tablet by mouth daily for 7 days., Disp: 7 tablet, Rfl: 0   PARAGARD INTRAUTERINE COPPER IU, by Intrauterine route., Disp: , Rfl:    predniSONE (DELTASONE) 20 MG tablet, Take 2 tablets (40 mg total) by mouth daily with breakfast., Disp: 6 tablet, Rfl: 0   tranexamic acid (LYSTEDA) 650 MG TABS tablet, Take 2 tablets (1,300 mg total) by mouth 3 (three) times daily. Take during menses for a maximum of five days, Disp: 30 tablet, Rfl: 2  Observations/Objective: Patient is  well-developed, well-nourished in no acute distress.  Resting comfortably at home.  Head is normocephalic, atraumatic.  No labored breathing.  Speech is clear and coherent with logical content.  Patient is alert and oriented at baseline.    Assessment and Plan: 1. Viral upper respiratory tract infection Advised patient that she is doing correct management. She may continue Alka Seltzer multi-symptom OTC  Push fluids Rest Honey for sore throat and cough or lozenges   Follow up with new/worsening or persistent symptoms as discussed      Follow Up Instructions: I discussed the assessment and treatment plan with the patient. The patient was provided an opportunity to ask questions and all were answered. The patient agreed with the plan and demonstrated an understanding of the instructions.  A copy of instructions were sent to the patient via MyChart unless otherwise noted below.    The patient was advised to call back or seek an in-person evaluation if the symptoms worsen or if the condition fails to improve as anticipated.  Time:  I spent 10 minutes with the patient via telehealth technology discussing the above problems/concerns.    Viviano Simas, FNP

## 2021-06-08 ENCOUNTER — Telehealth: Payer: Medicaid Other | Admitting: Physician Assistant

## 2021-06-08 ENCOUNTER — Encounter: Payer: Self-pay | Admitting: Physician Assistant

## 2021-06-08 DIAGNOSIS — J40 Bronchitis, not specified as acute or chronic: Secondary | ICD-10-CM

## 2021-06-08 MED ORDER — PREDNISONE 10 MG PO TABS: ORAL_TABLET | ORAL | 0 refills | Status: AC

## 2021-06-08 MED ORDER — BENZONATATE 200 MG PO CAPS: 200.0000 mg | ORAL_CAPSULE | Freq: Two times a day (BID) | ORAL | 0 refills | Status: DC | PRN

## 2021-06-08 NOTE — Progress Notes (Signed)
Virtual Visit Consent   [name], you are scheduled for a virtual visit with a [facility] provider today.     Just as with appointments in the office, your consent must be obtained to participate.  Your consent will be active for this visit and any virtual visit you may have with one of our providers in the next 365 days.     If you have a MyChart account, a copy of this consent can be sent to you electronically.  All virtual visits are billed to your insurance company just like a traditional visit in the office.    As this is a virtual visit, video technology does not allow for your provider to perform a traditional examination.  This may limit your provider's ability to fully assess your condition.  If your provider identifies any concerns that need to be evaluated in person or the need to arrange testing (such as labs, EKG, etc.), we will make arrangements to do so.     Although advances in technology are sophisticated, we cannot ensure that it will always work on either your end or our end.  If the connection with a video visit is poor, the visit may have to be switched to a telephone visit.  With either a video or telephone visit, we are not always able to ensure that we have a secure connection.     I need to obtain your verbal consent now.   Are you willing to proceed with your visit today?    Barbara Waller has provided verbal consent on 06/08/2021 for a virtual visit (video or telephone).   Roney Jaffe, PA-C   Date: 06/08/2021 9:20 AM   Virtual Visit via Video Note   I, Barbara Waller, connected with  Barbara Waller  ([id number], September 18, 1985) on 06/08/21 at  9:00 AM EST by a video-enabled telemedicine application and verified that I am speaking with the correct person using two identifiers.  Location: Patient: Virtual Visit Location Patient: Home Provider: Virtual Visit Location Provider: Office/Clinic   I discussed the limitations of evaluation and  management by telemedicine and the availability of in person appointments. The patient expressed understanding and agreed to proceed.    History of Present Illness: Barbara Waller is a 36 y.o. who identifies as a female who was assigned female at birth, states that she has been having a productive cough with green and yellow sputum for the past month.  States that she did start with a scratchy throat and several other upper respiratory symptoms, but most have resolved with the exception of the cough.  States that she does work from home, works on the phone and states that the cough is very bothersome during the day, states that it is worse in the evening when she lays down.  Denies shortness of breath or wheezing, but does state that she can hear herself breathe.  Denies fever, nausea, vomiting, diarrhea, body aches  Previously took a home COVID test that were negative.  States that she has been using Alka-Seltzer severe cold and flu, and other over-the-counter cough medications without much relief.   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/13/2016   Prediabetes 01/03/2016    Allergies:  Allergies  Allergen Reactions   Ferrous Sulfate Swelling   Shellfish Allergy Swelling   Medications:  Current Outpatient Medications:    benzonatate (  TESSALON) 200 MG capsule, Take 1 capsule (200 mg total) by mouth 2 (two) times daily as needed for cough., Disp: 20 capsule, Rfl: 0   predniSONE (DELTASONE) 10 MG tablet, Take 4 tablets (40 mg total) by mouth daily with breakfast for 3 days, THEN 3 tablets (30 mg total) daily with breakfast for 3 days, THEN 2 tablets (20 mg total) daily with breakfast for 3 days, THEN 1 tablet (10 mg total) daily with breakfast for 3 days., Disp: 30 tablet, Rfl: 0   acetaminophen (TYLENOL) 325 MG tablet, Take 2 tablets (650 mg total) by mouth every 6 (six) hours as  needed (for pain scale < 4)., Disp: 30 tablet, Rfl: 0   amoxicillin (AMOXIL) 875 MG tablet, Take 1 tablet (875 mg total) by mouth 2 (two) times daily., Disp: 20 tablet, Rfl: 0   lidocaine (XYLOCAINE) 2 % solution, Use as directed 10 mLs in the mouth or throat as needed for mouth pain., Disp: 100 mL, Rfl: 0   megestrol (MEGACE) 40 MG tablet, Take 1 tablet by mouth daily for 7 days., Disp: 7 tablet, Rfl: 0   PARAGARD INTRAUTERINE COPPER IU, by Intrauterine route., Disp: , Rfl:    tranexamic acid (LYSTEDA) 650 MG TABS tablet, Take 2 tablets (1,300 mg total) by mouth 3 (three) times daily. Take during menses for a maximum of five days, Disp: 30 tablet, Rfl: 2  Observations/Objective: Patient is well-developed, well-nourished in no acute distress.  Resting comfortably  at home.  Head is normocephalic, atraumatic.  No labored breathing.  Speech is clear and coherent with logical content.  Patient is alert and oriented at baseline.    Assessment and Plan:  1. Bronchitis Trial prednisone, Tessalon Perles.  Patient education given on supportive care, red flags given for prompt reevaluation. - predniSONE (DELTASONE) 10 MG tablet; Take 4 tablets (40 mg total) by mouth daily with breakfast for 3 days, THEN 3 tablets (30 mg total) daily with breakfast for 3 days, THEN 2 tablets (20 mg total) daily with breakfast for 3 days, THEN 1 tablet (10 mg total) daily with breakfast for 3 days.  Dispense: 30 tablet; Refill: 0 - benzonatate (TESSALON) 200 MG capsule; Take 1 capsule (200 mg total) by mouth 2 (two) times daily as needed for cough.  Dispense: 20 capsule; Refill: 0   Follow Up Instructions: I discussed the assessment and treatment plan with the patient. The patient was provided an opportunity to ask questions and all were answered. The patient agreed with the plan and demonstrated an understanding of the instructions.  A copy of instructions were sent to the patient via MyChart unless otherwise noted  below.    The patient was advised to call back or seek an in-person evaluation if the symptoms worsen or if the condition fails to improve as anticipated.  Time:  I spent 12 minutes with the patient via telehealth technology discussing the above problems/concerns.    Kasandra Knudsen Mayers, PA-C

## 2021-06-08 NOTE — Patient Instructions (Signed)
Reuben Likes, thank you for joining Kennieth Rad, PA-C for today's virtual visit.  While this provider is not your primary care provider (PCP), if your PCP is located in our provider database this encounter information will be shared with them immediately following your visit.  Consent: (Patient) Barbara Waller provided verbal consent for this virtual visit at the beginning of the encounter.  Current Medications:  Current Outpatient Medications:    benzonatate (TESSALON) 200 MG capsule, Take 1 capsule (200 mg total) by mouth 2 (two) times daily as needed for cough., Disp: 20 capsule, Rfl: 0   predniSONE (DELTASONE) 10 MG tablet, Take 4 tablets (40 mg total) by mouth daily with breakfast for 3 days, THEN 3 tablets (30 mg total) daily with breakfast for 3 days, THEN 2 tablets (20 mg total) daily with breakfast for 3 days, THEN 1 tablet (10 mg total) daily with breakfast for 3 days., Disp: 30 tablet, Rfl: 0   acetaminophen (TYLENOL) 325 MG tablet, Take 2 tablets (650 mg total) by mouth every 6 (six) hours as needed (for pain scale < 4)., Disp: 30 tablet, Rfl: 0   amoxicillin (AMOXIL) 875 MG tablet, Take 1 tablet (875 mg total) by mouth 2 (two) times daily., Disp: 20 tablet, Rfl: 0   lidocaine (XYLOCAINE) 2 % solution, Use as directed 10 mLs in the mouth or throat as needed for mouth pain., Disp: 100 mL, Rfl: 0   megestrol (MEGACE) 40 MG tablet, Take 1 tablet by mouth daily for 7 days., Disp: 7 tablet, Rfl: 0   PARAGARD INTRAUTERINE COPPER IU, by Intrauterine route., Disp: , Rfl:    tranexamic acid (LYSTEDA) 650 MG TABS tablet, Take 2 tablets (1,300 mg total) by mouth 3 (three) times daily. Take during menses for a maximum of five days, Disp: 30 tablet, Rfl: 2   Medications ordered in this encounter:  Meds ordered this encounter  Medications   predniSONE (DELTASONE) 10 MG tablet    Sig: Take 4 tablets (40 mg total) by mouth daily with breakfast for 3 days, THEN 3 tablets (30 mg  total) daily with breakfast for 3 days, THEN 2 tablets (20 mg total) daily with breakfast for 3 days, THEN 1 tablet (10 mg total) daily with breakfast for 3 days.    Dispense:  30 tablet    Refill:  0    Order Specific Question:   Supervising Provider    Answer:   MILLER, BRIAN [[id number]]   benzonatate (TESSALON) 200 MG capsule    Sig: Take 1 capsule (200 mg total) by mouth 2 (two) times daily as needed for cough.    Dispense:  20 capsule    Refill:  0    Order Specific Question:   Supervising Provider    Answer:   Sabra Heck, [place]     *If you need refills on other medications prior to your next appointment, please contact your pharmacy*  Follow-Up: Call back or seek an in-person evaluation if the symptoms worsen or if the condition fails to improve as anticipated.  Other Instructions I also encourage you to get plenty of rest, drink lots of fluids, and use an over-the-counter allergy medication such as Zyrtec or Claritin.  I hope that you feel better soon   If you have been instructed to have an in-person evaluation today at a local Urgent Care facility, please use the link below. It will take you to a list of all of our available [facility], including  address, phone number and hours of operation. Please do not delay care.  [facility]  If you or a family member do not have a primary care provider, use the link below to schedule a visit and establish care. When you choose a [facility] primary care physician or advanced practice provider, you gain a long-term partner in health. Find a Primary Care Provider  Learn more about [facility]'s in-office and virtual care options: [facility]

## 2021-09-11 ENCOUNTER — Ambulatory Visit (INDEPENDENT_AMBULATORY_CARE_PROVIDER_SITE_OTHER): Payer: Medicaid Other | Admitting: *Deleted

## 2021-09-11 VITALS — BP 126/88 | HR 80

## 2021-09-11 DIAGNOSIS — N912 Amenorrhea, unspecified: Secondary | ICD-10-CM

## 2021-09-11 LAB — POCT URINE PREGNANCY: Preg Test, Ur: NEGATIVE

## 2021-09-11 NOTE — Progress Notes (Cosign Needed)
Barbara Waller here for a UPT. Pt had a positive and a negative upt at home. LMP is around 07/27/21. Pt also has a paragard IUD in place.     UPT in office Negative.   Will get an HCG to confirm. Pt to follow up as needed.  Scheryl Marten, RN

## 2021-09-11 NOTE — Progress Notes (Signed)
Patient seen and assessed by nursing staff.  Agree with documentation and plan.  

## 2021-09-12 LAB — BETA HCG QUANT (REF LAB): hCG Quant: <1 m[IU]/mL

## 2021-11-05 ENCOUNTER — Ambulatory Visit
Admission: EM | Admit: 2021-11-05 | Discharge: 2021-11-05 | Disposition: A | Discharge status: Home / Self Care | Payer: Medicaid Other | Attending: Emergency Medicine | Admitting: Emergency Medicine

## 2021-11-05 DIAGNOSIS — R110 Nausea: Secondary | ICD-10-CM | POA: Insufficient documentation

## 2021-11-05 DIAGNOSIS — Z202 Contact with and (suspected) exposure to infections with a predominantly sexual mode of transmission: Secondary | ICD-10-CM | POA: Diagnosis not present

## 2021-11-05 LAB — POCT URINALYSIS DIP (MANUAL ENTRY)
Bilirubin, UA: NEGATIVE
Glucose, UA: NEGATIVE mg/dL
Leukocytes, UA: NEGATIVE
Nitrite, UA: NEGATIVE
Spec Grav, UA: >=1.03 — AB (ref 1.010–1.025)
Urobilinogen, UA: 0.2 E.U./dL
pH, UA: 5.5 (ref 5.0–8.0)

## 2021-11-05 LAB — POCT URINE PREGNANCY: Preg Test, Ur: NEGATIVE

## 2021-11-05 NOTE — ED Provider Notes (Signed)
[facility]    CSN: [id number] Arrival date & time: 11/05/21  1538    HISTORY   Chief Complaint  Patient presents with   SEXUALLY TRANSMITTED DISEASE   Nausea   Possible Pregnancy   HPI Barbara Waller is a pleasant, 36 y.o. female who presents to urgent care today. Patient presents to urgent care stating she has been feeling nauseated and would like to have pregnancy test despite having an IUD in place.  Patient is also requesting STD testing.  Patient denies known exposure to STDs or any symptoms of STD.  The history is provided by the patient.   Past Medical History:  Diagnosis Date   Anemia 08/04/2017   BMI 32.0-32.9,adult 06/26/2015   Breast cyst, left 10/02/2018   Breast lump on left side at 6 o'clock position 10/21/2018   Breast pain, left 10/02/2018   Chronic midline low back pain without sciatica 05/16/2016   Flu-like symptoms 05/25/2018   Influenza B 05/25/2018   Left breast abscess 06/26/2015   LSIL (low grade squamous intraepithelial lesion) on Pap smear 08/21/2009   ABNORMAL PAP   Obesity    Sore throat 05/25/2018   Vaginal Pap smear, abnormal    Vitamin B12 deficiency 11/2019   Vitamin D deficiency 08/04/2017   Patient Active Problem List   Diagnosis Date Noted   Abnormal uterine bleeding (AUB) 11/08/2020   Shellfish allergy 05/13/2016   Prediabetes 01/03/2016   Past Surgical History:  Procedure Laterality Date   axilla abscess     COLOSTOMY     OB History     Gravida  3   Para  3   Term  3   Preterm      AB      Living  3      SAB      IAB      Ectopic      Multiple  0   Live Births  3          Home Medications    Prior to Admission medications   Medication Sig Start Date End Date Taking? Authorizing Provider  acetaminophen (TYLENOL) 325 MG tablet Take 2 tablets (650 mg total) by mouth every 6 (six) hours as needed (for pain scale < 4). 08/19/19   Venora Maples, MD  amoxicillin (AMOXIL) 875 MG  tablet Take 1 tablet (875 mg total) by mouth 2 (two) times daily. 11/05/20   Particia Nearing, PA-C  benzonatate (TESSALON) 200 MG capsule Take 1 capsule (200 mg total) by mouth 2 (two) times daily as needed for cough. 06/08/21   Mayers, Cari S, PA-C  lidocaine (XYLOCAINE) 2 % solution Use as directed 10 mLs in the mouth or throat as needed for mouth pain. 11/05/20   Particia Nearing, PA-C  megestrol (MEGACE) 40 MG tablet Take 1 tablet by mouth daily for 7 days. 11/02/20   LampteyBritta Mccreedy, MD  PARAGARD INTRAUTERINE COPPER IU by Intrauterine route. 10/01/20   [provider]  tranexamic acid (LYSTEDA) 650 MG TABS tablet Take 2 tablets (1,300 mg total) by mouth 3 (three) times daily. Take during menses for a maximum of five days 11/08/20   [place] Bing, MD  norethindrone (ORTHO MICRONOR) 0.35 MG tablet Take 1 tablet (0.35 mg total) by mouth daily. Patient not taking: Reported on 09/14/2019 08/19/19 08/23/20  Venora Maples, MD  norethindrone-ethinyl estradiol (NECON) 0.5-35 MG-MCG tablet Take 1 tablet by mouth daily. 09/14/19 08/23/20  Federico Flake, MD  Family History Family History  Problem Relation Age of Onset   Diabetes Mother    Hypertension Mother    Diabetes Maternal Grandmother    Diabetes Father    Heart failure Father    Anesthesia problems Neg Hx    Social History Social History   Tobacco Use   Smoking status: Never   Smokeless tobacco: Never  Vaping Use   Vaping Use: Never used  Substance Use Topics   Alcohol use: No   Drug use: No   Allergies   Ferrous sulfate and Shellfish allergy  Review of Systems Review of Systems Pertinent findings revealed after performing a 14 point review of systems has been noted in the history of present illness.  Physical Exam Triage Vital Signs ED Triage Vitals  Enc Vitals Group     BP 02/08/21 0827 (!) 147/82     Pulse Rate 02/08/21 0827 72     Resp 02/08/21 0827 18     Temp 02/08/21 0827 98.3 F  (36.8 C)     Temp Source 02/08/21 0827 Oral     SpO2 02/08/21 0827 98 %     Weight --      Height --      Head Circumference --      Peak Flow --      Pain Score 02/08/21 0826 5     Pain Loc --      Pain Edu? --      Excl. in [place]? --   No data found.  Updated Vital Signs There were no vitals taken for this visit.  Physical Exam Vitals and nursing note reviewed.  Constitutional:      General: She is not in acute distress.    Appearance: Normal appearance. She is not ill-appearing.  HENT:     Head: Normocephalic and atraumatic.  Eyes:     General: Lids are normal.        Right eye: No discharge.        Left eye: No discharge.     Extraocular Movements: Extraocular movements intact.     Conjunctiva/sclera: Conjunctivae normal.     Right eye: Right conjunctiva is not injected.     Left eye: Left conjunctiva is not injected.  Neck:     Trachea: Trachea and phonation normal.  Cardiovascular:     Rate and Rhythm: Normal rate and regular rhythm.     Pulses: Normal pulses.     Heart sounds: Normal heart sounds. No murmur heard.    No friction rub. No gallop.  Pulmonary:     Effort: Pulmonary effort is normal. No accessory muscle usage, prolonged expiration or respiratory distress.     Breath sounds: Normal breath sounds. No stridor, decreased air movement or transmitted upper airway sounds. No decreased breath sounds, wheezing, rhonchi or rales.  Chest:     Chest wall: No tenderness.  Genitourinary:    Comments: Patient politely declines pelvic exam today, patient provided a vaginal swab for testing. Musculoskeletal:        General: Normal range of motion.     Cervical back: Normal range of motion and neck supple. Normal range of motion.  Lymphadenopathy:     Cervical: No cervical adenopathy.  Skin:    General: Skin is warm and dry.     Findings: No erythema or rash.  Neurological:     General: No focal deficit present.     Mental Status: She is alert and oriented to  person, place, and  time.  Psychiatric:        Mood and Affect: Mood normal.        Behavior: Behavior normal.     Visual Acuity Right Eye Distance:   Left Eye Distance:   Bilateral Distance:    Right Eye Near:   Left Eye Near:    Bilateral Near:     UC Couse / Diagnostics / Procedures:     Radiology No results found.  Procedures Procedures (including critical care time) EKG  Pending results:  Labs Reviewed  POCT URINALYSIS DIP (MANUAL ENTRY) - Abnormal; Notable for the following components:      Result Value   Ketones, POC UA trace (5) (*)    Spec Grav, UA >=1.030 (*)    Blood, UA trace-intact (*)    Protein Ur, POC trace (*)    All other components within normal limits  RPR   Narrative:    Performed at:  [address] [facility] [address], [place], [place]  [id number] Lab Director: Jolene Schimke MD, Phone:  [phone]  HIV ANTIBODY (ROUTINE TESTING W REFLEX)   Narrative:    Performed at:  [facility] [address] [place], [place]  [id number] Lab Director: Jolene Schimke MD, Phone:  [phone]  POCT URINE PREGNANCY  CERVICOVAGINAL ANCILLARY ONLY    Medications Ordered in UC: Medications - No data to display  UC Diagnoses / Final Clinical Impressions(s)   I have reviewed the triage vital signs and the nursing notes.  Pertinent labs & imaging results that were available during my care of the patient were reviewed by me and considered in my medical decision making (see chart for details).    Final diagnoses:  Possible exposure to STD  Nausea without vomiting    STD screening was performed, patient advised that the results be posted to their MyChart and if any of the results are positive, they will be notified by phone, further treatment will be provided as indicated based on results of STD screening. Patient was advised to abstain from sexual intercourse until that they receive the results of their STD testing.  Patient was also advised to  use condoms to protect themselves from STD exposure. Urinalysis today was not concerning for urinary tract infection, patient did not report any symptoms of UTI.Marland Kitchen Urine pregnancy test was negative. Return precautions advised.  Drug allergies reviewed, all questions addressed.     ED Prescriptions   None    PDMP not reviewed this encounter.  Disposition Upon Discharge:  Condition: stable for discharge home  Patient presented with concern for an acute illness with associated systemic symptoms and significant discomfort requiring urgent management. In my opinion, this is a condition that a prudent lay person (someone who possesses an average knowledge of health and medicine) may potentially expect to result in complications if not addressed urgently such as respiratory distress, impairment of bodily function or dysfunction of bodily organs.   As such, the patient has been evaluated and assessed, work-up was performed and treatment was provided in alignment with urgent care protocols and evidence based medicine.  Patient/parent/caregiver has been advised that the patient may require follow up for further testing and/or treatment if the symptoms continue in spite of treatment, as clinically indicated and appropriate.  Routine symptom specific, illness specific and/or disease specific instructions were discussed with the patient and/or caregiver at length.  Prevention strategies for avoiding STD exposure were also discussed.  The patient will follow up with their current PCP if  and as advised. If the patient does not currently have a PCP we will assist them in obtaining one.   The patient may need specialty follow up if the symptoms continue, in spite of conservative treatment and management, for further workup, evaluation, consultation and treatment as clinically indicated and appropriate.  Patient/parent/caregiver verbalized understanding and agreement of plan as discussed.  All questions were  addressed during visit.  Please see discharge instructions below for further details of plan.  Discharge Instructions:   Discharge Instructions      The results of your vaginal swab test which screens for BV, yeast, gonorrhea, chlamydia and trichomonas will be made available to you once it is complete.  This typically takes 3 to 5 days.  Please note that we do not test for herpes virus unless you are having an active lesion concerning for herpes outbreak.  Please abstain from sexual intercourse of any kind, vaginal, oral or anal, until you have received the results of your STD testing.     Your urine pregnancy test today is negative.   The results of your vaginal swab test which tests for BV, yeast, gonorrhea, chlamydia and trichomonas and your blood tests for HIV and syphilis will be posted to your MyChart account in the next 3 to 5 days.  If any of your results are abnormal, you will receive a phone call regarding further treatment.  Additional prescriptions, if any are needed, will be provided for you at your pharmacy.   Please abstain from sexual intercourse of any kind, vaginal, oral or anal, until until you have received the results of your test.   Thank you for visiting urgent care today.  I appreciate the opportunity to participate in your care.       This office note has been dictated using Museum/gallery curator.  Unfortunately, this method of dictation can sometimes lead to typographical or grammatical errors.  I apologize for your inconvenience in advance if this occurs.  Please do not hesitate to reach out to me if clarification is needed.       Lynden Oxford Scales, PA-C 11/07/21 1451

## 2021-11-05 NOTE — Discharge Instructions (Signed)
The results of your vaginal swab test which screens for BV, yeast, gonorrhea, chlamydia and trichomonas will be made available to you once it is complete.  This typically takes 3 to 5 days.  Please note that we do not test for herpes virus unless you are having an active lesion concerning for herpes outbreak.  Please abstain from sexual intercourse of any kind, vaginal, oral or anal, until you have received the results of your STD testing.     Your urine pregnancy test today is negative.   The results of your vaginal swab test which tests for BV, yeast, gonorrhea, chlamydia and trichomonas and your blood tests for HIV and syphilis will be posted to your MyChart account in the next 3 to 5 days.  If any of your results are abnormal, you will receive a phone call regarding further treatment.  Additional prescriptions, if any are needed, will be provided for you at your pharmacy.   Please abstain from sexual intercourse of any kind, vaginal, oral or anal, until until you have received the results of your test.   Thank you for visiting urgent care today.  I appreciate the opportunity to participate in your care.

## 2021-11-05 NOTE — ED Triage Notes (Signed)
The patient is requesting to get pregnancy and STD testing. The patient states she has been more nauseated. The patient states she does have an IUD.

## 2021-11-06 LAB — CERVICOVAGINAL ANCILLARY ONLY
Bacterial Vaginitis (gardnerella): NEGATIVE
Candida Glabrata: NEGATIVE
Candida Vaginitis: NEGATIVE
Chlamydia: NEGATIVE
Comment: NEGATIVE
Comment: NEGATIVE
Comment: NEGATIVE
Comment: NEGATIVE
Comment: NEGATIVE
Comment: NORMAL
Neisseria Gonorrhea: NEGATIVE
Trichomonas: NEGATIVE

## 2021-11-06 LAB — RPR: RPR Ser Ql: NONREACTIVE

## 2021-11-06 LAB — HIV ANTIBODY (ROUTINE TESTING W REFLEX): HIV Screen 4th Generation wRfx: NONREACTIVE

## 2021-11-07 ENCOUNTER — Ambulatory Visit: Payer: Medicaid Other

## 2022-03-28 ENCOUNTER — Ambulatory Visit: Payer: Medicaid Other | Admitting: Family Medicine

## 2022-03-31 ENCOUNTER — Ambulatory Visit: Payer: Medicaid Other | Admitting: Family Medicine

## 2022-07-31 ENCOUNTER — Ambulatory Visit: Payer: Medicaid Other

## 2022-08-29 DIAGNOSIS — H6123 Impacted cerumen, bilateral: Secondary | ICD-10-CM | POA: Diagnosis not present

## 2022-08-29 DIAGNOSIS — Z0000 Encounter for general adult medical examination without abnormal findings: Secondary | ICD-10-CM | POA: Diagnosis not present

## 2022-08-29 DIAGNOSIS — Z111 Encounter for screening for respiratory tuberculosis: Secondary | ICD-10-CM | POA: Diagnosis not present

## 2022-10-12 ENCOUNTER — Ambulatory Visit
Admission: EM | Admit: 2022-10-12 | Discharge: 2022-10-12 | Disposition: A | Discharge status: Home / Self Care | Payer: Medicaid Other

## 2022-10-12 DIAGNOSIS — Z113 Encounter for screening for infections with a predominantly sexual mode of transmission: Secondary | ICD-10-CM | POA: Insufficient documentation

## 2022-10-12 DIAGNOSIS — R82998 Other abnormal findings in urine: Secondary | ICD-10-CM | POA: Diagnosis not present

## 2022-10-12 LAB — POCT URINALYSIS DIP (MANUAL ENTRY)
Bilirubin, UA: NEGATIVE
Glucose, UA: NEGATIVE mg/dL
Nitrite, UA: NEGATIVE
Protein Ur, POC: NEGATIVE mg/dL
Spec Grav, UA: >=1.03 — AB (ref 1.010–1.025)
Urobilinogen, UA: 0.2 E.U./dL
pH, UA: 6 (ref 5.0–8.0)

## 2022-10-12 LAB — POCT URINE PREGNANCY: Preg Test, Ur: NEGATIVE

## 2022-10-12 NOTE — ED Triage Notes (Signed)
Pt requested STD's test. Denies any symptoms.  

## 2022-10-12 NOTE — ED Provider Notes (Signed)
[facility]    CSN: [id number] Arrival date & time: 10/12/22  1500      History   Chief Complaint Chief Complaint  Patient presents with   Exposure to STD    HPI Barbara Waller is a 37 y.o. female presents for evaluation of STD screening.  She currently denies any symptoms including dysuria, vaginal discharge, fevers, nausea/vomiting, flank pain.  No STD exposure.  She does states she gets cysts along her bikini line whenever she starts or and her menstrual cycle.  She reports she did finish her as recently and does have a cyst along the bikini line.  Her boyfriend saw this and was concerned and as a precaution she is getting checked for STDs and would like this area swabbed for HSV.  She has no history of HSV.  No other concerns at this time.   Exposure to STD    Past Medical History:  Diagnosis Date   Anemia 08/04/2017   BMI 32.0-32.9,adult 06/26/2015   Breast cyst, left 10/02/2018   Breast lump on left side at 6 o'clock position 10/21/2018   Breast pain, left 10/02/2018   Chronic midline low back pain without sciatica 05/16/2016   Flu-like symptoms 05/25/2018   Influenza B 05/25/2018   Left breast abscess 06/26/2015   LSIL (low grade squamous intraepithelial lesion) on Pap smear 08/21/2009   ABNORMAL PAP   Obesity    Sore throat 05/25/2018   Vaginal Pap smear, abnormal    Vitamin B12 deficiency 11/2019   Vitamin D deficiency 08/04/2017    Patient Active Problem List   Diagnosis Date Noted   Abnormal uterine bleeding (AUB) 11/08/2020   Shellfish allergy 05/13/2016   Prediabetes 01/03/2016    Past Surgical History:  Procedure Laterality Date   axilla abscess     COLOSTOMY      OB History     Gravida  3   Para  3   Term  3   Preterm      AB      Living  3      SAB      IAB      Ectopic      Multiple  0   Live Births  3            Home Medications    Prior to Admission medications   Medication Sig Start Date  End Date Taking? Authorizing Provider  ELDERBERRY PO Take by mouth.   Yes [provider]  PARAGARD INTRAUTERINE COPPER IU by Intrauterine route. 10/01/20   [provider]  norethindrone (ORTHO MICRONOR) 0.35 MG tablet Take 1 tablet (0.35 mg total) by mouth daily. Patient not taking: Reported on 09/14/2019 08/19/19 08/23/20  Venora Maples, MD  norethindrone-ethinyl estradiol (NECON) 0.5-35 MG-MCG tablet Take 1 tablet by mouth daily. 09/14/19 08/23/20  Federico Flake, MD    Family History Family History  Problem Relation Age of Onset   Diabetes Mother    Hypertension Mother    Diabetes Maternal Grandmother    Diabetes Father    Heart failure Father    Anesthesia problems Neg Hx     Social History Social History   Tobacco Use   Smoking status: Never   Smokeless tobacco: Never  Vaping Use   Vaping Use: Never used  Substance Use Topics   Alcohol use: No   Drug use: No     Allergies   Ferrous sulfate, Shellfish allergy, and Sulfate   Review  of Systems Review of Systems  Genitourinary:        STD screening     Physical Exam Triage Vital Signs ED Triage Vitals  Enc Vitals Group     BP 10/12/22 1517 108/75     Pulse Rate 10/12/22 1517 78     Resp 10/12/22 1517 16     Temp 10/12/22 1517 98.4 F (36.9 C)     Temp Source 10/12/22 1517 Oral     SpO2 10/12/22 1517 97 %     Weight --      Height --      Head Circumference --      Peak Flow --      Pain Score 10/12/22 1519 0     Pain Loc --      Pain Edu? --      Excl. in GC? --    No data found.  Updated Vital Signs BP 108/75 (BP Location: Right Arm)   Pulse 78   Temp 98.4 F (36.9 C) (Oral)   Resp 16   SpO2 97%   Visual Acuity Right Eye Distance:   Left Eye Distance:   Bilateral Distance:    Right Eye Near:   Left Eye Near:    Bilateral Near:     Physical Exam Vitals and nursing note reviewed.  Constitutional:      Appearance: Normal appearance.  HENT:     Head:  Normocephalic and atraumatic.  Eyes:     Pupils: Pupils are equal, round, and reactive to light.  Cardiovascular:     Rate and Rhythm: Normal rate.  Pulmonary:     Effort: Pulmonary effort is normal.  Abdominal:     Tenderness: There is no right CVA tenderness or left CVA tenderness.  Genitourinary:      Comments: There is a small indurated nonfluctuant cyst to the left vaginal area.  No swelling, ulcerations, vesicles. Skin:    General: Skin is warm and dry.  Neurological:     General: No focal deficit present.     Mental Status: She is alert and oriented to person, place, and time.  Psychiatric:        Mood and Affect: Mood normal.        Behavior: Behavior normal.      UC Treatments / Results  Labs (all labs ordered are listed, but only abnormal results are displayed) Labs Reviewed  POCT URINALYSIS DIP (MANUAL ENTRY) - Abnormal; Notable for the following components:      Result Value   Ketones, POC UA trace (5) (*)    Spec Grav, UA >=1.030 (*)    Blood, UA trace-intact (*)    Leukocytes, UA Trace (*)    All other components within normal limits  HSV CULTURE AND TYPING  URINE CULTURE  RPR  HIV ANTIBODY (ROUTINE TESTING W REFLEX)  POCT URINE PREGNANCY  CERVICOVAGINAL ANCILLARY ONLY    EKG   Radiology No results found.  Procedures Procedures (including critical care time)  Medications Ordered in UC Medications - No data to display  Initial Impression / Assessment and Plan / UC Course  I have reviewed the triage vital signs and the nursing notes.  Pertinent labs & imaging results that were available during my care of the patient were reviewed by me and considered in my medical decision making (see chart for details).     Reviewed exam and symptoms with patient.  STD testing as ordered.  Did swab cyst of vaginal area to  rule out HSV.  UA with trace leuks and some blood.  She is asymptomatic so we will send this for culture and await results prior to  initiating treatment.  PCP follow-up as needed.  ER precautions reviewed and patient verbalized understanding Final Clinical Impressions(s) / UC Diagnoses   Final diagnoses:  Screening examination for STD (sexually transmitted disease)  Leukocytes in urine   Discharge Instructions   None    ED Prescriptions   None    PDMP not reviewed this encounter.   Radford Pax, NP 10/12/22 [phone]

## 2022-10-12 NOTE — Discharge Instructions (Signed)
Patient declined AVS 

## 2022-10-13 LAB — CERVICOVAGINAL ANCILLARY ONLY
Bacterial Vaginitis (gardnerella): NEGATIVE
Candida Glabrata: NEGATIVE
Candida Vaginitis: NEGATIVE
Chlamydia: NEGATIVE
Comment: NEGATIVE
Comment: NEGATIVE
Comment: NEGATIVE
Comment: NEGATIVE
Comment: NEGATIVE
Comment: NORMAL
Neisseria Gonorrhea: NEGATIVE
Trichomonas: NEGATIVE

## 2022-10-14 LAB — RPR: RPR Ser Ql: NONREACTIVE

## 2022-10-14 LAB — URINE CULTURE: Culture: 20000 — AB

## 2022-10-14 LAB — HIV ANTIBODY (ROUTINE TESTING W REFLEX): HIV Screen 4th Generation wRfx: NONREACTIVE

## 2022-10-15 LAB — HSV CULTURE AND TYPING

## 2022-10-29 ENCOUNTER — Telehealth: Payer: Self-pay

## 2022-10-29 NOTE — Telephone Encounter (Signed)
Left message for pt to call office back regarding mychart message about IUD

## 2022-12-01 ENCOUNTER — Encounter: Payer: Self-pay | Admitting: Obstetrics and Gynecology

## 2022-12-01 ENCOUNTER — Ambulatory Visit (INDEPENDENT_AMBULATORY_CARE_PROVIDER_SITE_OTHER): Payer: Medicaid Other | Admitting: Obstetrics and Gynecology

## 2022-12-01 ENCOUNTER — Other Ambulatory Visit (HOSPITAL_COMMUNITY)
Admission: RE | Admit: 2022-12-01 | Discharge: 2022-12-01 | Disposition: A | Discharge status: Home / Self Care | Payer: Medicaid Other | Source: Ambulatory Visit | Attending: Obstetrics and Gynecology | Admitting: Obstetrics and Gynecology

## 2022-12-01 VITALS — BP 116/76 | HR 89

## 2022-12-01 DIAGNOSIS — R7303 Prediabetes: Secondary | ICD-10-CM | POA: Diagnosis not present

## 2022-12-01 DIAGNOSIS — Z01419 Encounter for gynecological examination (general) (routine) without abnormal findings: Secondary | ICD-10-CM | POA: Insufficient documentation

## 2022-12-01 DIAGNOSIS — E538 Deficiency of other specified B group vitamins: Secondary | ICD-10-CM | POA: Diagnosis not present

## 2022-12-01 DIAGNOSIS — R790 Abnormal level of blood mineral: Secondary | ICD-10-CM | POA: Diagnosis not present

## 2022-12-01 NOTE — Progress Notes (Unsigned)
Patient presents for Annual.  Pt wants HGBA1C reports fatigues and family hx of DM.   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

## 2022-12-01 NOTE — Progress Notes (Unsigned)
Obstetrics and Gynecology Annual Patient Evaluation  Appointment Date: 12/02/2022  OBGYN Clinic: [facility]  Primary Care Provider: None  Chief Complaint:  Chief Complaint  Patient presents with   Gynecologic Exam    History of Present Illness: Barbara Waller is a 37 y.o.  G3P3003 (No LMP recorded. (Menstrual status: IUD).), seen for the above chief complaint. Her past medical history is significant for paragard in place since June 2022, pre-diabetes.    No issues or problems.  Review of Systems: Pertinent items are noted in HPI. Pertinent items noted in HPI and remainder of comprehensive ROS otherwise negative.   Past Medical History:  Past Medical History:  Diagnosis Date   Anemia 08/04/2017   BMI 32.0-32.9,adult 06/26/2015   Breast cyst, left 10/02/2018   Breast lump on left side at 6 o'clock position 10/21/2018   Breast pain, left 10/02/2018   Chronic midline low back pain without sciatica 05/16/2016   Flu-like symptoms 05/25/2018   Influenza B 05/25/2018   Left breast abscess 06/26/2015   LSIL (low grade squamous intraepithelial lesion) on Pap smear 08/21/2009   ABNORMAL PAP   Obesity    Sore throat 05/25/2018   Vaginal Pap smear, abnormal    Vitamin B12 deficiency 11/2019   Vitamin D deficiency 08/04/2017    Past Surgical History:  Past Surgical History:  Procedure Laterality Date   axilla abscess     COLOSTOMY      Past Obstetrical History:  OB History  Gravida Para Term Preterm AB Living  3 3 3     3   SAB IAB Ectopic Multiple Live Births        0 3    # Outcome Date GA Lbr Len/2nd Weight Sex Type Anes PTL Lv  3 Term 08/17/19 [redacted]w[redacted]d 16:05 / 00:16 6 lb 13.9 oz (3.116 kg) M Vag-Spont EPI  LIV  2 Term 08/31/14 [redacted]w[redacted]d 11:33 / 00:29 7 lb 11 oz (3.487 kg) M Vag-Spont EPI  LIV  1 Term 02/16/09 [redacted]w[redacted]d  6 lb 14 oz (3.118 kg) F Vag-Spont   LIV    Past Gynecological History: As per HPI. Periods: qmonth, regular,  approximately a week, not heavy but can be crampy in the beginning/before bleeding starts.  History of Pap Smear(s): Yes.   Last pap 2020, which was cytology and HPV negative  Social History:  Social History   Socioeconomic History   Marital status: In a relationship    Spouse name: Not on file   Number of children: 2   Years of education: Not on file   Highest education level: Some college, no degree  Occupational History   Not on file  Tobacco Use   Smoking status: Never   Smokeless tobacco: Never  Vaping Use   Vaping status: Never Used  Substance and Sexual Activity   Alcohol use: No   Drug use: No   Sexual activity: Yes    Partners: Male    Birth control/protection: I.U.D.  Other Topics Concern   Not on file  Social History Narrative   Not on file   Social Determinants of Health   Financial Resource Strain: Not on file  Food Insecurity: Medium Risk (08/29/2022)   [facility]   Food vital sign    Within the past 12 months, you worried that your food would run out before you got money to buy more: Never true    Within the past 12 months, the food you bought  just didn't last and you didn't have money to get more. : Sometimes true  Transportation Needs: Not on file (08/29/2022)  Physical Activity: Not on file  Stress: Not on file  Social Connections: Not on file  Intimate Partner Violence: Not on file    Family History:  Family History  Problem Relation Age of Onset   Diabetes Mother    Hypertension Mother    Diabetes Maternal Grandmother    Diabetes Father    Heart failure Father    Anesthesia problems Neg Hx     Medications Barbara Waller. Barbara Waller had no medications administered during this visit. Current Outpatient Medications  Medication Sig Dispense Refill   PARAGARD INTRAUTERINE COPPER IU by Intrauterine route.     ELDERBERRY PO Take by mouth. (Patient not taking: Reported on 12/01/2022)     No current facility-administered  medications for this visit.    Allergies Ferrous sulfate, Shellfish allergy, and Sulfate   Physical Exam:  BP 116/76   Pulse 89  There is no height or weight on file to calculate BMI. General appearance: Well nourished, well developed female in no acute distress.  Neck:  Supple, normal appearance, and no thyromegaly  Cardiovascular: normal s1 and s2.  No murmurs, rubs or gallops. Respiratory:  Clear to auscultation bilateral. Normal respiratory effort Abdomen: positive bowel sounds and no masses, hernias; diffusely non tender to palpation, non distended Breasts: breasts appear normal, no suspicious masses, no skin or nipple changes or axillary nodes, and normal palpation. Neuro/Psych:  Normal mood and affect.  Skin:  Warm and dry.  Lymphatic:  No inguinal lymphadenopathy.   Cervical exam performed in the presence of a chaperone Pelvic exam: is not limited by body habitus EGBUS: within normal limits Vagina: within normal limits and with no blood or discharge in the vault Cervix: normal appearing cervix without tenderness, discharge or lesions. IUD strings 3-4cm Uterus:  nonenlarged and non tender Adnexa:  normal adnexa and no mass, fullness, tenderness Rectovaginal: deferred  Laboratory: none  Radiology: none  Assessment: patient doing well  Plan:  1. Well woman exam with routine gynecological exam Routine care. Patient doesn't have a PCP, so labs checked today. Patient declines STI screening - Cytology - PAP - CBC - Comprehensive metabolic panel - Hemoglobin A1c - TSH Rfx on Abnormal to Free T4 - Lipid panel  Patient in a relationship and partner does not have any children and they are wondering if it's okay to have. I d/w them re: AMA and slightly higher risk of miscarriage; genetic issues, such as down syndrome; GHTN/pre-eclampsia; GDM, but I reviewed her prior pregnancies and no problems or issues in them  RTC PRN   Cornelia Copa MD Attending Center for  [facility])

## 2022-12-02 ENCOUNTER — Encounter: Payer: Self-pay | Admitting: Obstetrics and Gynecology

## 2022-12-02 DIAGNOSIS — E781 Pure hyperglyceridemia: Secondary | ICD-10-CM | POA: Insufficient documentation

## 2022-12-02 LAB — CBC
Hematocrit: 32.8 % — ABNORMAL LOW (ref 34.0–46.6)
Hemoglobin: 10 g/dL — ABNORMAL LOW (ref 11.1–15.9)
MCH: 23.6 pg — ABNORMAL LOW (ref 26.6–33.0)
MCHC: 30.5 g/dL — ABNORMAL LOW (ref 31.5–35.7)
MCV: 77 fL — ABNORMAL LOW (ref 79–97)
Platelets: 380 10*3/uL (ref 150–450)
RBC: 4.24 x10E6/uL (ref 3.77–5.28)
RDW: 15.8 % — ABNORMAL HIGH (ref 11.7–15.4)
WBC: 6.3 10*3/uL (ref 3.4–10.8)

## 2022-12-02 LAB — COMPREHENSIVE METABOLIC PANEL
ALT: 32 IU/L (ref 0–32)
AST: 23 IU/L (ref 0–40)
Albumin: 3.9 g/dL (ref 3.9–4.9)
Alkaline Phosphatase: 65 IU/L (ref 44–121)
BUN/Creatinine Ratio: 9 (ref 9–23)
BUN: 9 mg/dL (ref 6–20)
Bilirubin Total: 0.3 mg/dL (ref 0.0–1.2)
CO2: 25 mmol/L (ref 20–29)
Calcium: 9 mg/dL (ref 8.7–10.2)
Chloride: 104 mmol/L (ref 96–106)
Creatinine, Ser: 0.96 mg/dL (ref 0.57–1.00)
Globulin, Total: 3.4 g/dL (ref 1.5–4.5)
Glucose: 106 mg/dL — ABNORMAL HIGH (ref 70–99)
Potassium: 3.3 mmol/L — ABNORMAL LOW (ref 3.5–5.2)
Sodium: 141 mmol/L (ref 134–144)
Total Protein: 7.3 g/dL (ref 6.0–8.5)
eGFR: 78 mL/min/{1.73_m2} (ref >59)

## 2022-12-02 LAB — LIPID PANEL
Chol/HDL Ratio: 2.5 ratio (ref 0.0–4.4)
Cholesterol, Total: 145 mg/dL (ref 100–199)
HDL: 59 mg/dL (ref >39)
LDL Chol Calc (NIH): 55 mg/dL (ref 0–99)
Triglycerides: 191 mg/dL — ABNORMAL HIGH (ref 0–149)
VLDL Cholesterol Cal: 31 mg/dL (ref 5–40)

## 2022-12-02 LAB — HEMOGLOBIN A1C
Est. average glucose Bld gHb Est-mCnc: 131 mg/dL
Hgb A1c MFr Bld: 6.2 % — ABNORMAL HIGH (ref 4.8–5.6)

## 2022-12-02 LAB — TSH RFX ON ABNORMAL TO FREE T4: TSH: 0.589 u[IU]/mL (ref 0.450–4.500)

## 2022-12-03 ENCOUNTER — Other Ambulatory Visit: Payer: Self-pay | Admitting: *Deleted

## 2022-12-03 DIAGNOSIS — E781 Pure hyperglyceridemia: Secondary | ICD-10-CM

## 2022-12-03 DIAGNOSIS — R7303 Prediabetes: Secondary | ICD-10-CM

## 2022-12-03 LAB — ANEMIA PROFILE B
Ferritin: 10 ng/mL — ABNORMAL LOW (ref 15–150)
Folate: 12 ng/mL (ref >3.0)
Iron Saturation: 7 % — CL (ref 15–55)
Iron: 32 ug/dL (ref 27–159)
Total Iron Binding Capacity: 438 ug/dL (ref 250–450)
UIBC: 406 ug/dL (ref 131–425)
Vitamin B-12: 210 pg/mL — ABNORMAL LOW (ref 232–1245)

## 2022-12-03 LAB — SPECIMEN STATUS REPORT

## 2022-12-05 DIAGNOSIS — R790 Abnormal level of blood mineral: Secondary | ICD-10-CM | POA: Insufficient documentation

## 2022-12-05 DIAGNOSIS — E538 Deficiency of other specified B group vitamins: Secondary | ICD-10-CM | POA: Insufficient documentation

## 2022-12-05 MED ORDER — VITAMIN B-12 1000 MCG PO TABS: 1000.0000 ug | ORAL_TABLET | Freq: Every day | ORAL | 2 refills | Status: AC

## 2022-12-05 NOTE — Addendum Note (Signed)
Addended by: [place] Bing on: 12/05/2022 08:03 AM   Modules accepted: Orders

## 2022-12-08 ENCOUNTER — Encounter: Payer: Self-pay | Admitting: Obstetrics and Gynecology

## 2022-12-09 ENCOUNTER — Encounter: Payer: Self-pay | Admitting: Obstetrics and Gynecology

## 2022-12-10 LAB — CYTOLOGY - PAP
Comment: NEGATIVE
Comment: NEGATIVE
Comment: NEGATIVE
Diagnosis: UNDETERMINED — AB
HPV 16: NEGATIVE
HPV 18 / 45: NEGATIVE
High risk HPV: POSITIVE — AB

## 2022-12-16 ENCOUNTER — Encounter: Payer: Self-pay | Admitting: Obstetrics and Gynecology

## 2022-12-16 DIAGNOSIS — R87610 Atypical squamous cells of undetermined significance on cytologic smear of cervix (ASC-US): Secondary | ICD-10-CM | POA: Insufficient documentation

## 2022-12-25 ENCOUNTER — Encounter: Payer: Self-pay | Admitting: Obstetrics and Gynecology

## 2022-12-25 ENCOUNTER — Encounter: Payer: Self-pay | Admitting: Skilled Nursing Facility1

## 2022-12-25 ENCOUNTER — Encounter: Payer: Medicaid Other | Attending: Obstetrics and Gynecology | Admitting: Skilled Nursing Facility1

## 2022-12-25 VITALS — Ht 66.0 in | Wt 193.8 lb

## 2022-12-25 DIAGNOSIS — Z833 Family history of diabetes mellitus: Secondary | ICD-10-CM | POA: Insufficient documentation

## 2022-12-25 DIAGNOSIS — R7303 Prediabetes: Secondary | ICD-10-CM | POA: Insufficient documentation

## 2022-12-25 DIAGNOSIS — E781 Pure hyperglyceridemia: Secondary | ICD-10-CM | POA: Insufficient documentation

## 2022-12-25 DIAGNOSIS — Z713 Dietary counseling and surveillance: Secondary | ICD-10-CM | POA: Diagnosis not present

## 2022-12-25 NOTE — Progress Notes (Signed)
Medical Nutrition Therapy  Appointment Start time:  8:58  Appointment End time:  10:00  Primary concerns today: prediabetes   Referral diagnosis: 73.03   NUTRITION ASSESSMENT    Clinical Medical Hx: shellfish allergy, prediabetes Medications: N/A Labs: A1C 6.2, B12 210, iron saturation 7, triglycerides, potassium 3.3, hemoglobin 10 Notable Signs/Symptoms: fatigue, headaches, legs feeling pins and needles   Lifestyle & Dietary Hx  Pt states she does have an extensive family hx of DM.  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  Pt states she craves a lot of fried foods. Pt states she likes carbonation.   Estimated daily fluid intake:  oz Supplements: B12 1000 mcg Sleep: 5-6 hours not feeling rested  Stress / self-care: 8 out of 10 (high) Current average weekly physical activity: ADL's  24-Hr Dietary Recall: 60+% meals eaten out First Meal: skipped or fast food or eggs and [place] bacon and whole wheat toast Snack: banana Snacks: chips and cookies Second Meal: fast food  Snack: sweet Third Meal: lasagna  Snack:  Beverages: soda, sparkling water, coffee + sugar, lemonade, juice,     NUTRITION INTERVENTION  Nutrition education (E-1) on the following topics:   Prediabetes pathophysiology   Creation of balanced and diverse meals to increase the intake of nutrient-rich foods that provide essential vitamins, minerals, fiber, and phytonutrients Variety of Fruits and Vegetables: Aim for a colorful array of fruits and vegetables to ensure a wide range of nutrients. Include a mix of leafy greens, berries, citrus fruits, cruciferous vegetables, and more. Whole Grains: Choose whole grains over refined grains. Examples include brown rice, quinoa, oats, whole wheat, and barley. Lean Proteins: Include lean sources of protein, such as poultry, fish, tofu, legumes, beans, lentils, and low-fat dairy products. Limit red and processed meats. Healthy  Fats: Incorporate sources of healthy fats, including avocados, nuts, seeds, and olive oil. Limit saturated and trans fats found in fried and processed foods. Dairy or Dairy Alternatives: Choose low-fat or fat-free dairy products, or plant-based alternatives like almond or soy milk. Portion Control: Be mindful of portion sizes to avoid overeating. Pay attention to hunger and satisfaction cues. Limit Added Sugars: Minimize the consumption of sugary beverages, snacks, and desserts. Check food labels for added sugars and opt for natural sources of sweetness such as whole fruits. Hydration: Drink plenty of water throughout the day. Limit sugary drinks and excessive caffeine intake. Moderate Sodium Intake: Reduce the consumption of high-sodium foods. Use herbs and spices for flavor instead of excessive salt. Meal Planning and Preparation: Plan and prepare meals ahead of time to make healthier choices more convenient. Include a mix of food groups in each meal. Limit Processed Foods: Minimize the intake of highly processed and packaged foods that are often high in added sugars, salt, and unhealthy fats. Regular Physical Activity: Combine a healthy diet with regular physical activity for overall well-being. Aim for at least 150 minutes of moderate-intensity aerobic exercise per week, along with strength training. Moderation and Balance: Enjoy treats and indulgent foods in moderation, emphasizing balance rather than strict restriction.  Handouts Provided Include  Detailed MyPlate  Learning Style & Readiness for Change Teaching method utilized: Visual & Auditory  Demonstrated degree of understanding via: Teach Back  Barriers to learning/adherence to lifestyle change: none identified   Goals Established by Pt -Ask your Doctor for a B12 and iron lab draw for mid October  -do not take your B12 the day before or day off your  lab draw -get an iron supplement 65mg  DAILY; twice daily if you do not  feel an energy increase after 4 days -Aim for 64 ounces of fluid per day -Make every dinner from home   MONITORING & EVALUATION Dietary intake, weekly physical activity  Next Steps  Patient is to call or email with any future questions or concerns.

## 2022-12-25 NOTE — Patient Instructions (Addendum)
-  Ask your Doctor for a B12 and iron lab draw for mid October  -do not take your B12 the day before or day off your lab draw -get an iron supplement 65mg  DAILY; twice daily if you do not feel an energy increase after 4 days -Aim for 64 ounces of fluid per day -Make every dinner from home

## 2023-01-02 ENCOUNTER — Other Ambulatory Visit (HOSPITAL_BASED_OUTPATIENT_CLINIC_OR_DEPARTMENT_OTHER): Payer: Self-pay

## 2023-03-24 ENCOUNTER — Ambulatory Visit (HOSPITAL_COMMUNITY)
Admission: RE | Admit: 2023-03-24 | Discharge: 2023-03-24 | Discharge status: Against Medical Advice | Payer: Self-pay | Source: Ambulatory Visit

## 2023-03-24 DIAGNOSIS — R059 Cough, unspecified: Secondary | ICD-10-CM

## 2023-03-24 NOTE — ED Notes (Signed)
This RN went to call pt from lobby at this time. 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 Pt reports she will be back tomorrow.   Pt ambulatory on departure and respirations regular/unlabored/even. Pt does not appear to be in any distress.

## 2023-03-26 ENCOUNTER — Encounter (HOSPITAL_COMMUNITY): Payer: Self-pay

## 2023-03-26 ENCOUNTER — Ambulatory Visit (HOSPITAL_COMMUNITY)
Admission: RE | Admit: 2023-03-26 | Discharge: 2023-03-26 | Disposition: A | Discharge status: Home / Self Care | Payer: Medicaid Other | Source: Ambulatory Visit | Attending: Family Medicine | Admitting: Family Medicine

## 2023-03-26 ENCOUNTER — Ambulatory Visit (HOSPITAL_COMMUNITY): Payer: Medicaid Other

## 2023-03-26 VITALS — BP 109/75 | HR 70 | Temp 98.5°F | Resp 16 | Ht 66.0 in | Wt 193.8 lb

## 2023-03-26 DIAGNOSIS — J069 Acute upper respiratory infection, unspecified: Secondary | ICD-10-CM

## 2023-03-26 MED ORDER — BENZONATATE 200 MG PO CAPS: 200.0000 mg | ORAL_CAPSULE | Freq: Three times a day (TID) | ORAL | 0 refills | Status: DC | PRN

## 2023-03-26 NOTE — Discharge Instructions (Signed)
You were seen today for upper respiratory symptoms.  Your xray appears normal today, but if the radiologist reads this differently we will notify you.  This is likely viral.  I have sent out a cough medication for you today.  Please return if you have worsening cough or develop a fever.

## 2023-03-26 NOTE — ED Triage Notes (Addendum)
"  Cough for 3 weeks with mucus. Need to be checked for pneumonia, because of cases of pneumonia have been confirmed at work. - Entered by patient"  Patient having chest congestion with the cough. Some sharp chest pains on and off with deep breaths. Patient did a virtual last weekend and was given cough medication. The next day after taking this she had blurred vision so she stopped.

## 2023-03-26 NOTE — ED Provider Notes (Signed)
[facility]    CSN: [id number] Arrival date & time: 03/26/23  1303      History   Chief Complaint Chief Complaint  Patient presents with   Cough   Appointment    HPI Barbara Waller is a 37 y.o. female.    Cough Associated symptoms: chills and rhinorrhea   Associated symptoms: no fever, no shortness of breath, no sore throat and no wheezing   Patient is here for a cough x 3 weeks.  Mild chills, no fevers.  Some runny nose, congestion, drainage.  Some nausea, no vomiting.  No wheezing or sob. She does have a sharp pain at the right upper chest area with breathing at times.  Worse at night.  She did a virtual visit, and was prescribed cough medication.  It caused blurred vision and didn't take anything else.  +pneumonia cases at work.        Past Medical History:  Diagnosis Date   Anemia 08/04/2017   BMI 32.0-32.9,adult 06/26/2015   Breast cyst, left 10/02/2018   Breast lump on left side at 6 o'clock position 10/21/2018   Breast pain, left 10/02/2018   Chronic midline low back pain without sciatica 05/16/2016   Flu-like symptoms 05/25/2018   Influenza B 05/25/2018   Left breast abscess 06/26/2015   LSIL (low grade squamous intraepithelial lesion) on Pap smear 08/21/2009   ABNORMAL PAP   Obesity    Sore throat 05/25/2018   Vaginal Pap smear, abnormal    Vitamin B12 deficiency 11/2019   Vitamin D deficiency 08/04/2017    Patient Active Problem List   Diagnosis Date Noted   ASCUS with positive high risk HPV cervical 12/16/2022   Low ferritin 12/05/2022   Low serum vitamin B12 12/05/2022   Hypertriglyceridemia 12/02/2022   Anemia 08/04/2017   Shellfish allergy 05/13/2016   Prediabetes 01/03/2016    Past Surgical History:  Procedure Laterality Date   axilla abscess     COLOSTOMY      OB History     Gravida  3   Para  3   Term  3   Preterm      AB      Living  3      SAB      IAB      Ectopic      Multiple  0    Live Births  3            Home Medications    Prior to Admission medications   Medication Sig Start Date End Date Taking? Authorizing Provider  Cyanocobalamin (B-12 PO) Take by mouth.   Yes [provider]  Ferrous Sulfate (IRON PO) Take by mouth.   Yes [provider]  Multiple Vitamins-Minerals (WOMENS MULTI PO) Take by mouth.   Yes [provider]  ELDERBERRY PO Take by mouth. Patient not taking: Reported on 12/01/2022    [provider]  PARAGARD INTRAUTERINE COPPER IU by Intrauterine route. 10/01/20   [provider]  norethindrone (ORTHO MICRONOR) 0.35 MG tablet Take 1 tablet (0.35 mg total) by mouth daily. Patient not taking: Reported on 09/14/2019 08/19/19 08/23/20  Venora Maples, MD  norethindrone-ethinyl estradiol (NECON) 0.5-35 MG-MCG tablet Take 1 tablet by mouth daily. 09/14/19 08/23/20  Federico Flake, MD    Family History Family History  Problem Relation Age of Onset   Diabetes Mother    Hypertension Mother    Diabetes Maternal Grandmother    Diabetes Father  Heart failure Father    Anesthesia problems Neg Hx     Social History Social History   Tobacco Use   Smoking status: Never   Smokeless tobacco: Never  Vaping Use   Vaping status: Never Used  Substance Use Topics   Alcohol use: No   Drug use: No     Allergies   Ferrous sulfate, Shellfish allergy, and Sulfate   Review of Systems Review of Systems  Constitutional:  Positive for chills. Negative for fever.  HENT:  Positive for congestion and rhinorrhea. Negative for sore throat.   Respiratory:  Positive for cough. Negative for shortness of breath and wheezing.   Gastrointestinal: Negative.   Genitourinary: Negative.   Psychiatric/Behavioral: Negative.       Physical Exam Triage Vital Signs ED Triage Vitals  Encounter Vitals Group     BP 03/26/23 1325 109/75     Systolic BP Percentile --      Diastolic BP Percentile --      Pulse  Rate 03/26/23 1325 70     Resp 03/26/23 1325 16     Temp 03/26/23 1325 98.5 F (36.9 C)     Temp Source 03/26/23 1325 Oral     SpO2 03/26/23 1325 100 %     Weight 03/26/23 1325 193 lb 12.6 oz (87.9 kg)     Height 03/26/23 1325 5\' 6"  (1.676 m)     Head Circumference --      Peak Flow --      Pain Score 03/26/23 1322 0     Pain Loc --      Pain Education --      Exclude from Growth Chart --    No data found.  Updated Vital Signs BP 109/75 (BP Location: Left Arm)   Pulse 70   Temp 98.5 F (36.9 C) (Oral)   Resp 16   Ht 5\' 6"  (1.676 m)   Wt 87.9 kg   LMP 03/24/2023 (Exact Date)   SpO2 100%   BMI 31.28 kg/m   Visual Acuity Right Eye Distance:   Left Eye Distance:   Bilateral Distance:    Right Eye Near:   Left Eye Near:    Bilateral Near:     Physical Exam Constitutional:      General: She is not in acute distress.    Appearance: Normal appearance. She is not ill-appearing.  HENT:     Nose: Congestion present. No rhinorrhea.     Mouth/Throat:     Mouth: Mucous membranes are moist.  Cardiovascular:     Rate and Rhythm: Normal rate and regular rhythm.  Pulmonary:     Effort: Pulmonary effort is normal.     Breath sounds: Normal breath sounds.  Abdominal:     Palpations: Abdomen is soft.  Musculoskeletal:     Cervical back: Normal range of motion and neck supple. No tenderness.     Comments: +TTP to the chest wall, just right of the sternum  Lymphadenopathy:     Cervical: No cervical adenopathy.  Neurological:     General: No focal deficit present.     Mental Status: She is alert.  Psychiatric:        Mood and Affect: Mood normal.      UC Treatments / Results  Labs (all labs ordered are listed, but only abnormal results are displayed) Labs Reviewed - No data to display  EKG   Radiology No results found.  Procedures Procedures (including critical care time)  Medications Ordered  in UC Medications - No data to display  Initial Impression /  Assessment and Plan / UC Course  I have reviewed the triage vital signs and the nursing notes.  Pertinent labs & imaging results that were available during my care of the patient were reviewed by me and considered in my medical decision making (see chart for details).   Final Clinical Impressions(s) / UC Diagnoses   Final diagnoses:  Viral URI with cough     Discharge Instructions      You were seen today for upper respiratory symptoms.  Your xray appears normal today, but if the radiologist reads this differently we will notify you.  This is likely viral.  I have sent out a cough medication for you today.  Please return if you have worsening cough or develop a fever.     ED Prescriptions     Medication Sig Dispense Auth. Provider   benzonatate (TESSALON) 200 MG capsule Take 1 capsule (200 mg total) by mouth 3 (three) times daily as needed for cough. 21 capsule Jannifer Franklin, MD      PDMP not reviewed this encounter.   Jannifer Franklin, MD 03/26/23 1409

## 2023-10-14 ENCOUNTER — Encounter

## 2023-10-24 ENCOUNTER — Ambulatory Visit
Admission: EM | Admit: 2023-10-24 | Discharge: 2023-10-24 | Disposition: A | Discharge status: Home / Self Care | Attending: Emergency Medicine | Admitting: Emergency Medicine

## 2023-10-24 DIAGNOSIS — L309 Dermatitis, unspecified: Secondary | ICD-10-CM | POA: Insufficient documentation

## 2023-10-24 DIAGNOSIS — Z113 Encounter for screening for infections with a predominantly sexual mode of transmission: Secondary | ICD-10-CM | POA: Insufficient documentation

## 2023-10-24 MED ORDER — PREDNISONE 10 MG (21) PO TBPK: ORAL_TABLET | Freq: Every day | ORAL | 0 refills | Status: DC

## 2023-10-24 NOTE — Discharge Instructions (Addendum)
 Prednisone  taper: 6 pills on day 1 5 pills on day 2 4 pills on day 3 Etc, until all the pills are gone after 6 days   Also take zyrtec daily and benadryl  nightly to help reduce itching  We will call you if anything on your swab returns positive. You can also see these results on MyChart. Please abstain from sexual intercourse until your results return.  Follow up with your new primary care provider!

## 2023-10-24 NOTE — ED Provider Notes (Signed)
 [facility]    CSN: [id number] Arrival date & time: 10/24/23  9096     History   Chief Complaint Chief Complaint  Patient presents with   Rash   SEXUALLY TRANSMITTED DISEASE    HPI Barbara Waller is a 38 y.o. female.  Here with rash around the mouth for about 2 months Itching, not painful.  She did a virtual visit 10 days ago and was prescribed prednisone  burst. Reports the rash improved, but then returned the day after finishing prednisone . At first it was on both sides of mouth, but now mostly on the right.  Worse in the heat  Also trying aloe vera  While here, she would also like STD testing No symptoms, no known exposures   Past Medical History:  Diagnosis Date   Anemia 08/04/2017   BMI 32.0-32.9,adult 06/26/2015   Breast cyst, left 10/02/2018   Breast lump on left side at 6 o'clock position 10/21/2018   Breast pain, left 10/02/2018   Chronic midline low back pain without sciatica 05/16/2016   Flu-like symptoms 05/25/2018   Influenza B 05/25/2018   Left breast abscess 06/26/2015   LSIL (low grade squamous intraepithelial lesion) on Pap smear 08/21/2009   ABNORMAL PAP   Obesity    Sore throat 05/25/2018   Vaginal Pap smear, abnormal    Vitamin B12 deficiency 11/2019   Vitamin D  deficiency 08/04/2017    Patient Active Problem List   Diagnosis Date Noted   ASCUS with positive high risk HPV cervical 12/16/2022   Low ferritin 12/05/2022   Low serum vitamin B12 12/05/2022   Hypertriglyceridemia 12/02/2022   Anemia 08/04/2017   Shellfish allergy 05/13/2016   Prediabetes 01/03/2016    Past Surgical History:  Procedure Laterality Date   axilla abscess     COLOSTOMY      OB History     Gravida  3   Para  3   Term  3   Preterm      AB      Living  3      SAB      IAB      Ectopic      Multiple  0   Live Births  3            Home Medications    Prior to Admission medications   Medication Sig Start Date End  Date Taking? Authorizing Provider  predniSONE  (STERAPRED UNI-PAK 21 TAB) 10 MG (21) TBPK tablet Take by mouth daily. Please take as directed 10/24/23  Yes Tabbetha Kutscher, Asberry, PA-C  benzonatate  (TESSALON ) 200 MG capsule Take 1 capsule (200 mg total) by mouth 3 (three) times daily as needed for cough. 03/26/23   Piontek, Rocky, MD  Cyanocobalamin  (B-12 PO) Take by mouth.    [provider]  Ferrous Sulfate  (IRON PO) Take by mouth.    [provider]  Multiple Vitamins-Minerals (WOMENS MULTI PO) Take by mouth.    [provider]  PARAGARD  INTRAUTERINE COPPER  IU by Intrauterine route. 10/01/20   [provider]  norethindrone  (ORTHO MICRONOR ) 0.35 MG tablet Take 1 tablet (0.35 mg total) by mouth daily. Patient not taking: Reported on 09/14/2019 08/19/19 08/23/20  Lola Donnice HERO, MD  norethindrone -ethinyl estradiol  (NECON) 0.5-35 MG-MCG tablet Take 1 tablet by mouth daily. 09/14/19 08/23/20  Eldonna Suzen Octave, MD    Family History Family History  Problem Relation Age of Onset   Diabetes Mother    Hypertension Mother    Diabetes Maternal  Grandmother    Diabetes Father    Heart failure Father    Anesthesia problems Neg Hx     Social History Social History   Tobacco Use   Smoking status: Never   Smokeless tobacco: Never  Vaping Use   Vaping status: Never Used  Substance Use Topics   Alcohol use: No   Drug use: No     Allergies   Ferrous sulfate , Shellfish allergy, and Sulfate   Review of Systems Review of Systems  Skin:  Positive for rash.   As per HPI  Physical Exam Triage Vital Signs ED Triage Vitals  Encounter Vitals Group     BP 10/24/23 0912 129/83     Girls Systolic BP Percentile --      Girls Diastolic BP Percentile --      Boys Systolic BP Percentile --      Boys Diastolic BP Percentile --      Pulse Rate 10/24/23 0912 78     Resp 10/24/23 0912 18     Temp 10/24/23 0912 98.7 F (37.1 C)     Temp Source 10/24/23 0912 Oral      SpO2 10/24/23 0912 97 %     Weight --      Height --      Head Circumference --      Peak Flow --      Pain Score 10/24/23 0911 0     Pain Loc --      Pain Education --      Exclude from Growth Chart --    No data found.  Updated Vital Signs BP 129/83 (BP Location: Right Arm)   Pulse 78   Temp 98.7 F (37.1 C) (Oral)   Resp 18   LMP 10/11/2023 (Exact Date)   SpO2 97%   Visual Acuity Right Eye Distance:   Left Eye Distance:   Bilateral Distance:    Right Eye Near:   Left Eye Near:    Bilateral Near:     Physical Exam Vitals and nursing note reviewed.  Constitutional:      General: She is not in acute distress.    Appearance: Normal appearance.  HENT:     Right Ear: Tympanic membrane, ear canal and external ear normal.     Left Ear: Tympanic membrane, ear canal and external ear normal.     Mouth/Throat:     Mouth: Mucous membranes are moist. No oral lesions.     Pharynx: Oropharynx is clear. No posterior oropharyngeal erythema.      Comments: Papular rash on the right side of mouth, mostly upper but several on the lower as well. Not vesicular. No rash inside the mouth  Eyes:     Conjunctiva/sclera: Conjunctivae normal.     Pupils: Pupils are equal, round, and reactive to light.  Cardiovascular:     Rate and Rhythm: Normal rate and regular rhythm.     Pulses: Normal pulses.     Heart sounds: Normal heart sounds.  Pulmonary:     Effort: Pulmonary effort is normal. No respiratory distress.     Breath sounds: Normal breath sounds. No wheezing.  Musculoskeletal:        General: Normal range of motion.     Cervical back: Normal range of motion.  Skin:    Findings: Rash present.  Neurological:     Mental Status: She is alert and oriented to person, place, and time.      UC Treatments / Results  Labs (  all labs ordered are listed, but only abnormal results are displayed) Labs Reviewed  CERVICOVAGINAL ANCILLARY ONLY    EKG   Radiology No results  found.  Procedures Procedures (including critical care time)  Medications Ordered in UC Medications - No data to display  Initial Impression / Assessment and Plan / UC Course  I have reviewed the triage vital signs and the nursing notes.  Pertinent labs & imaging results that were available during my care of the patient were reviewed by me and considered in my medical decision making (see chart for details).  Rash around the mouth Dermatitis. Does not appear fungal or bacterial at this time. Not shingles given crossed midline. Not herpetic. No rash elsewhere on the body. Discussed possible etiologies.  Initially improved with prednisone  but rebound rash when she finished. Will go ahead and start prednisone  taper instead. Also discussed antihistamine regimen for itch relief. Staff has set patient up with a PCP visit for next month  STD screen: Cytology swab pending Treat positive result as indicated No questions at this time  Final Clinical Impressions(s) / UC Diagnoses   Final diagnoses:  Dermatitis of face  Screen for STD (sexually transmitted disease)     Discharge Instructions      Prednisone  taper: 6 pills on day 1 5 pills on day 2 4 pills on day 3 Etc, until all the pills are gone after 6 days   Also take zyrtec daily and benadryl  nightly to help reduce itching  We will call you if anything on your swab returns positive. You can also see these results on MyChart. Please abstain from sexual intercourse until your results return.  Follow up with your new primary care provider!     ED Prescriptions     Medication Sig Dispense Auth. Provider   predniSONE  (STERAPRED UNI-PAK 21 TAB) 10 MG (21) TBPK tablet Take by mouth daily. Please take as directed 21 tablet Almina Schul, Asberry, PA-C      PDMP not reviewed this encounter.   Jeryl Asberry, PA-C 10/24/23 [phone]

## 2023-10-24 NOTE — ED Triage Notes (Signed)
 Pt present with a rash around her lips. Pt states when she is in the hit she feels more irritation. Pt states she took prednisone , felt relief, the rash went away and states the rash came back again. Pt has applied aloe vera. Reports itching.   Pt is requesting STD testing, no exposure. Pt had a recent break up.

## 2023-10-26 LAB — CERVICOVAGINAL ANCILLARY ONLY
Chlamydia: NEGATIVE
Comment: NEGATIVE
Comment: NEGATIVE
Comment: NORMAL
Neisseria Gonorrhea: NEGATIVE
Trichomonas: NEGATIVE

## 2023-11-03 ENCOUNTER — Telehealth: Admitting: Nurse Practitioner

## 2023-11-03 DIAGNOSIS — L710 Perioral dermatitis: Secondary | ICD-10-CM | POA: Diagnosis not present

## 2023-11-03 MED ORDER — CLINDAMYCIN PHOS (TWICE-DAILY) 1 % EX GEL
Freq: Two times a day (BID) | CUTANEOUS | 1 refills | Status: DC
Start: 2023-11-03 — End: 2024-02-18

## 2023-11-03 NOTE — Progress Notes (Signed)
 E Visit for Rash  We are sorry that you are not feeling well. Here is how we plan to help!  It appears that you have Perioral dermatitis. This is a rash that is typically around your mouth.   We are going to prescribe:  Meds ordered this encounter  Medications   clindamycin  (CLINDAGEL) 1 % gel    Sig: Apply topically 2 (two) times daily.    Dispense:  30 g    Refill:  1      HOME CARE:  Take cool showers and avoid direct sunlight. No food or drink is known to cause or worsen perioral dermatitis. However, in some cases, chewing gum has been linked to the rash.  GET HELP RIGHT AWAY IF:  Symptoms don't go away after treatment. Severe itching that persists. If you rash spreads or swells. If you rash begins to smell. If it blisters and opens or develops a yellow-brown crust. You develop a fever. You have a sore throat. You become short of breath.  MAKE SURE YOU:  Understand these instructions. Will watch your condition. Will get help right away if you are not doing well or get worse.  Thank you for choosing an e-visit.  Your e-visit answers were reviewed by a board certified advanced clinical practitioner to complete your personal care plan. Depending upon the condition, your plan could have included both over the counter or prescription medications.  Please review your pharmacy choice. Make sure the pharmacy is open so you can pick up prescription now. If there is a problem, you may contact your provider through [employer] and have the prescription routed to another pharmacy.  Your safety is important to us . If you have drug allergies check your prescription carefully.   For the next 24 hours you can use MyChart to ask questions about today's visit, request a non-urgent call back, or ask for a work or school excuse. You will get an email in the next two days asking about your experience. I hope that your e-visit has been valuable and will speed your recovery.  I  spent approximately 5 minutes reviewing the patient's history, current symptoms and coordinating their care today.

## 2023-11-04 ENCOUNTER — Other Ambulatory Visit: Payer: Self-pay | Admitting: Nurse Practitioner

## 2023-11-04 DIAGNOSIS — L710 Perioral dermatitis: Secondary | ICD-10-CM

## 2023-11-04 MED ORDER — DOXYCYCLINE HYCLATE 100 MG PO TABS
100.0000 mg | ORAL_TABLET | Freq: Every day | ORAL | 0 refills | Status: AC
Start: 2023-11-04 — End: 2023-12-04

## 2023-11-04 NOTE — Progress Notes (Signed)
 Changed Rx to oral per patient request

## 2023-11-13 ENCOUNTER — Encounter: Payer: Self-pay | Admitting: Internal Medicine

## 2023-11-13 ENCOUNTER — Ambulatory Visit: Admitting: Internal Medicine

## 2023-11-13 VITALS — BP 98/70 | HR 69 | Temp 98.9°F | Ht 66.0 in | Wt 199.2 lb

## 2023-11-13 DIAGNOSIS — D509 Iron deficiency anemia, unspecified: Secondary | ICD-10-CM | POA: Diagnosis not present

## 2023-11-13 DIAGNOSIS — L710 Perioral dermatitis: Secondary | ICD-10-CM | POA: Diagnosis not present

## 2023-11-13 DIAGNOSIS — E538 Deficiency of other specified B group vitamins: Secondary | ICD-10-CM | POA: Diagnosis not present

## 2023-11-13 DIAGNOSIS — R87610 Atypical squamous cells of undetermined significance on cytologic smear of cervix (ASC-US): Secondary | ICD-10-CM

## 2023-11-13 DIAGNOSIS — E781 Pure hyperglyceridemia: Secondary | ICD-10-CM

## 2023-11-13 DIAGNOSIS — R7303 Prediabetes: Secondary | ICD-10-CM

## 2023-11-13 DIAGNOSIS — R87810 Cervical high risk human papillomavirus (HPV) DNA test positive: Secondary | ICD-10-CM

## 2023-11-13 LAB — TSH: TSH: 0.56 u[IU]/mL (ref 0.35–5.50)

## 2023-11-13 LAB — CBC WITH DIFFERENTIAL/PLATELET
Basophils Absolute: 0 K/uL (ref 0.0–0.1)
Basophils Relative: 0.2 % (ref 0.0–3.0)
Eosinophils Absolute: 0.1 K/uL (ref 0.0–0.7)
Eosinophils Relative: 3.4 % (ref 0.0–5.0)
HCT: 34.2 % — ABNORMAL LOW (ref 36.0–46.0)
Hemoglobin: 10.7 g/dL — ABNORMAL LOW (ref 12.0–15.0)
Lymphocytes Relative: 36.2 % (ref 12.0–46.0)
Lymphs Abs: 1.5 K/uL (ref 0.7–4.0)
MCHC: 31.3 g/dL (ref 30.0–36.0)
MCV: 77.3 fl — ABNORMAL LOW (ref 78.0–100.0)
Monocytes Absolute: 0.4 K/uL (ref 0.1–1.0)
Monocytes Relative: 9.8 % (ref 3.0–12.0)
Neutro Abs: 2 K/uL (ref 1.4–7.7)
Neutrophils Relative %: 50.4 % (ref 43.0–77.0)
Platelets: 331 K/uL (ref 150.0–400.0)
RBC: 4.43 Mil/uL (ref 3.87–5.11)
RDW: 16 % — ABNORMAL HIGH (ref 11.5–15.5)
WBC: 4 K/uL (ref 4.0–10.5)

## 2023-11-13 LAB — COMPREHENSIVE METABOLIC PANEL WITH GFR
ALT: 13 U/L (ref 0–35)
AST: 15 U/L (ref 0–37)
Albumin: 3.9 g/dL (ref 3.5–5.2)
Alkaline Phosphatase: 48 U/L (ref 39–117)
BUN: 11 mg/dL (ref 6–23)
CO2: 26 meq/L (ref 19–32)
Calcium: 9.1 mg/dL (ref 8.4–10.5)
Chloride: 106 meq/L (ref 96–112)
Creatinine, Ser: 0.87 mg/dL (ref 0.40–1.20)
GFR: 83.76 mL/min (ref >60.00)
Glucose, Bld: 87 mg/dL (ref 70–99)
Potassium: 3.4 meq/L — ABNORMAL LOW (ref 3.5–5.1)
Sodium: 140 meq/L (ref 135–145)
Total Bilirubin: 0.7 mg/dL (ref 0.2–1.2)
Total Protein: 7.5 g/dL (ref 6.0–8.3)

## 2023-11-13 LAB — VITAMIN B12: Vitamin B-12: 127 pg/mL — ABNORMAL LOW (ref 211–911)

## 2023-11-13 LAB — IBC + FERRITIN
Ferritin: 11.2 ng/mL (ref 10.0–291.0)
Iron: 48 ug/dL (ref 42–145)
Saturation Ratios: 11.2 % — ABNORMAL LOW (ref 20.0–50.0)
TIBC: 428.4 ug/dL (ref 250.0–450.0)
Transferrin: 306 mg/dL (ref 212.0–360.0)

## 2023-11-13 LAB — HEMOGLOBIN A1C: Hgb A1c MFr Bld: 6.2 % (ref 4.6–6.5)

## 2023-11-13 NOTE — Patient Instructions (Signed)
 Call OB/GYN to schedule pap smear.

## 2023-11-13 NOTE — Progress Notes (Signed)
 [facility] [address] [place] [address] Dept: [phone] Dept Fax: [phone]  New Patient Office Visit  Subjective:   Barbara Waller [birth year] 11/13/2023  Chief Complaint  Patient presents with   Establish Care    Referral to dermatology  Discuss pap at next visit and HPV vaccine     HPI: Jalayla Chrismer presents today to establish care at [facility]. Introduced to Publishing rights manager role and practice setting.  All questions answered.  Concerns: See below   Discussed the use of AI scribe software for clinical note transcription with the patient, who gave verbal consent to proceed.  History of Present Illness   Ebelin Dillehay is a 38 year old female who presents to establish care and discuss dermatological concerns.  She has been experiencing a persistent breakout since mid-May, initially appearing on one side of her face and spreading. Despite using her son's eczema cream, the breakout persisted. She sought care at urgent care and was prescribed prednisone , which initially alleviated symptoms, but they returned after completing the course. A second course of prednisone  was prescribed, but symptoms recurred again. She was then prescribed doxycycline , which has significantly helped. 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  She has a history of iron deficiency and takes iron supplements, specifically Ferrous gluconate, which Ferrous sulfate  is not well tolerated by her stomach. She also has a history of low B12 levels.   She has a history of prediabetes and elevated triglycerides. She is concerned about her diet and cravings, noting a family history of diabetes-related health issues that contributed to her parents' deaths. She is interested in monitoring her blood sugar  levels and managing her prediabetes.  She has a history of an abnormal Pap smear showing ASCUS and a positive HPV test on 12/01/2022 with OBGYN, though not for high-risk strains like HPV 16 or 18. She has not had a follow-up Pap smear since the abnormal result.       The following portions of the patient's history were reviewed and updated as appropriate: past medical history, past surgical history, family history, social history, allergies, medications, and problem list.   Patient Active Problem List   Diagnosis Date Noted   Perioral dermatitis 11/13/2023   ASCUS with positive high risk HPV cervical 12/16/2022   Low ferritin 12/05/2022   Low serum vitamin B12 12/05/2022   Hypertriglyceridemia 12/02/2022   Anemia 08/04/2017   Shellfish allergy 05/13/2016   Prediabetes 01/03/2016   Past Medical History:  Diagnosis Date   Allergy 2016   Anemia 08/04/2017   Arthritis 2012   BMI 32.0-32.9,adult 06/26/2015   Breast cyst, left 10/02/2018   Breast lump on left side at 6 o'clock position 10/21/2018   Breast pain, left 10/02/2018   Chronic midline low back pain without sciatica 05/16/2016   Flu-like symptoms 05/25/2018   Influenza B 05/25/2018   Left breast abscess 06/26/2015   LSIL (low grade squamous intraepithelial lesion) on Pap smear 08/21/2009   ABNORMAL PAP   Obesity    Sore throat 05/25/2018   Vaginal Pap smear, abnormal    Vitamin B12 deficiency 11/2019   Vitamin D  deficiency 08/04/2017   Past Surgical History:  Procedure Laterality Date   axilla abscess     COLOSTOMY     Family History  Problem Relation Age of Onset   Diabetes Mother  Hypertension Mother    Diabetes Maternal Grandmother    Diabetes Father    Heart failure Father    Arthritis Father    Anesthesia problems Neg Hx     Current Outpatient Medications:    clindamycin  (CLINDAGEL) 1 % gel, Apply topically 2 (two) times daily., Disp: 30 g, Rfl: 1   Cyanocobalamin  (B-12 PO), Take by mouth., Disp: ,  Rfl:    ferrous gluconate (FERGON) 324 MG tablet, Take 324 mg by mouth daily with breakfast., Disp: , Rfl:    Multiple Vitamins-Minerals (WOMENS MULTI PO), Take by mouth., Disp: , Rfl:    PARAGARD  INTRAUTERINE COPPER  IU, by Intrauterine route., Disp: , Rfl:    doxycycline  (VIBRA -TABS) 100 MG tablet, Take 1 tablet (100 mg total) by mouth daily. Use daily for 2-4 weeks. Take with food. Stop when symptoms resolve. Avoid direct sunlight (Patient not taking: Reported on 11/13/2023), Disp: 30 tablet, Rfl: 0 Allergies  Allergen Reactions   Ferrous Sulfate  Swelling   Shellfish Allergy Swelling   Sulfate Rash    ROS: A complete ROS was performed with pertinent positives/negatives noted in the HPI. The remainder of the ROS are negative.   Objective:   Today's Vitals   11/13/23 0819  BP: 98/70  Pulse: 69  Temp: 98.9 F (37.2 C)  TempSrc: Temporal  SpO2: 100%  Weight: 199 lb 3.2 oz (90.4 kg)  Height: 5' 6 (1.676 m)    GENERAL: Well-appearing, in NAD. Well nourished.  SKIN: Pink, warm and dry. Dry scaling rash with macules to left periorbital region NECK: Trachea midline. Full ROM w/o pain or tenderness. No lymphadenopathy.  RESPIRATORY: Chest wall symmetrical. Respirations even and non-labored. Breath sounds clear to auscultation bilaterally.  CARDIAC: S1, S2 present, regular rate and rhythm. Peripheral pulses 2+ bilaterally.  EXTREMITIES: Without clubbing, cyanosis, or edema.  NEUROLOGIC: Steady, even gait.  PSYCH/MENTAL STATUS: Alert, oriented x 3. Cooperative, appropriate mood and affect.   Health Maintenance Due  Topic Date Due   Hepatitis C Screening  Never done    No results found for any visits on 11/13/23.  Assessment & Plan:  Assessment and Plan    Perioral dermatitis Perioral dermatitis with initial prednisone  treatment showing temporary improvement, followed by recurrence. Doxycycline  and A&D ointment have improved symptoms. - Continue doxycycline  100mg  Po daily for one  month. - Continue A&D ointment for symptomatic relief. - Refer to dermatology for further evaluation and management.  Iron deficiency Iron deficiency with ongoing supplementation. Current iron levels require evaluation. - Order blood work to check iron levels and hemoglobin.  Vitamin B12 deficiency Vitamin B12 deficiency requiring evaluation of current levels. - Order blood work to check vitamin B12 levels.  Hypertriglyceridemia  - Will do fasting lab work at next visit  - advise healthy diet and regular exercise  Prediabetes Prediabetes with dietary concerns and family history of diabetes. Current glucose control status needs assessment. - Order blood work to check A1c. - Order CMP   History of ASCUS on Pap smear with HPV positivity Previous Pap smear showed ASCUS with HPV positivity, no high-risk strains like HPV 16 or 18. Repeat Pap smear recommended. - Advise to call OBGYN to schedule repeat Pap smear. - Discuss HPV vaccination series for protection against high-risk HPV strains.       Orders Placed This Encounter  Procedures   CBC with Differential/Platelet   Comprehensive metabolic panel with GFR   TSH   Hemoglobin A1C   IBC + Ferritin   Vitamin B12  Ambulatory referral to Dermatology    Referral Priority:   Routine    Referral Type:   Consultation    Referral Reason:   Specialty Services Required    Requested Specialty:   Dermatology    Number of Visits Requested:   1   No orders of the defined types were placed in this encounter.   Return in about 3 months (around 02/13/2024) for Anemia, predM, cholesterol, b12 - fasting lab work .   Rosina Senters, FNP

## 2023-11-16 ENCOUNTER — Ambulatory Visit: Payer: Self-pay | Admitting: Internal Medicine

## 2023-11-18 ENCOUNTER — Ambulatory Visit: Payer: Self-pay

## 2023-11-18 NOTE — Telephone Encounter (Signed)
 FYI Only or Action Required?: FYI only for provider.  Patient was last seen in primary care on 11/13/2023 by Billy Knee, FNP.  Called Nurse Triage reporting Back Pain.  Symptoms began several days ago.  Interventions attempted: Prescription medications: Muscle relaxers/Ibuprofen .  Symptoms are: unchanged.  Triage Disposition: See PCP When Office is Open (Within 3 Days)  Patient/caregiver understands and will follow disposition?: Yes  **Patient scheduled for 8/8**              Copied from CRM #[id number]. Topic: Clinical - Red Word Triage >> Nov 18, 2023  3:51 PM [place] E wrote: [facility] that prompted transfer to Nurse Triage: Back pain. Patient has been experiencing back pain / muscle spasms since Monday. Described the pain as sharp. Reason for Disposition  [1] MODERATE back pain (e.g., interferes with normal activities) AND [2] present > 3 days  Answer Assessment - Initial Assessment Questions 1. ONSET: When did the pain begin? (e.g., minutes, hours, days)     Last Monday  2. LOCATION: Where does it hurt? (upper, mid or lower back)     Mid- Lower back  3. SEVERITY: How bad is the pain?  (e.g., Scale 1-10; mild, moderate, or severe)     5/10  4. PATTERN: Is the pain constant? (e.g., yes, no; constant, intermittent)      Constant  5. RADIATION: Does the pain shoot into your legs or somewhere else?     No  6. CAUSE:  What do you think is causing the back pain?      Degenerative disk disease   7. BACK OVERUSE:  Any recent lifting of heavy objects, strenuous work or exercise?     No   8. MEDICINES: What have you taken so far for the pain? (e.g., nothing, acetaminophen , NSAIDS)     Muscle relaxer, Ibuprofen    9. NEUROLOGIC SYMPTOMS: Do you have any weakness, numbness, or problems with bowel/bladder control?     No  10. OTHER SYMPTOMS: Do you have any other symptoms? (e.g., fever, abdomen pain, burning with urination, blood in urine)        No   Was seen in UC a few months ago, Degenerative disk disease was the diagnosis  Protocols used: Back Pain-A-AH

## 2023-11-19 ENCOUNTER — Emergency Department (HOSPITAL_COMMUNITY)
Admission: EM | Admit: 2023-11-19 | Discharge: 2023-11-19 | Disposition: A | Discharge status: Home / Self Care | Attending: Emergency Medicine | Admitting: Emergency Medicine

## 2023-11-19 ENCOUNTER — Emergency Department (HOSPITAL_COMMUNITY)

## 2023-11-19 ENCOUNTER — Other Ambulatory Visit: Payer: Self-pay

## 2023-11-19 DIAGNOSIS — X500XXA Overexertion from strenuous movement or load, initial encounter: Secondary | ICD-10-CM | POA: Diagnosis not present

## 2023-11-19 DIAGNOSIS — M545 Low back pain, unspecified: Secondary | ICD-10-CM | POA: Diagnosis present

## 2023-11-19 DIAGNOSIS — S39012A Strain of muscle, fascia and tendon of lower back, initial encounter: Secondary | ICD-10-CM | POA: Insufficient documentation

## 2023-11-19 DIAGNOSIS — Y990 Civilian activity done for income or pay: Secondary | ICD-10-CM | POA: Insufficient documentation

## 2023-11-19 DIAGNOSIS — Y92210 Daycare center as the place of occurrence of the external cause: Secondary | ICD-10-CM | POA: Insufficient documentation

## 2023-11-19 LAB — POC URINE PREG, ED: Preg Test, Ur: NEGATIVE

## 2023-11-19 MED ORDER — CYCLOBENZAPRINE HCL 10 MG PO TABS: 10.0000 mg | ORAL_TABLET | Freq: Two times a day (BID) | ORAL | 0 refills | Status: DC | PRN

## 2023-11-19 MED ORDER — CYCLOBENZAPRINE HCL 10 MG PO TABS
10.0000 mg | ORAL_TABLET | Freq: Once | ORAL | Status: AC
  Administered 2023-11-19: 10 mg via ORAL
  Filled 2023-11-19: qty 1

## 2023-11-19 MED ORDER — KETOROLAC TROMETHAMINE 15 MG/ML IJ SOLN
15.0000 mg | Freq: Once | INTRAMUSCULAR | Status: AC
  Administered 2023-11-19: 15 mg via INTRAMUSCULAR
  Filled 2023-11-19: qty 1

## 2023-11-19 MED ORDER — NAPROXEN 500 MG PO TABS: 500.0000 mg | ORAL_TABLET | Freq: Two times a day (BID) | ORAL | 0 refills | Status: DC

## 2023-11-19 NOTE — ED Provider Notes (Signed)
 [facility] AT [facility] Provider Note   CSN: [id number] Arrival date & time: 11/19/23  0139     Patient presents with: Back Pain   Barbara Waller is a 38 y.o. female who presents to the ED for evaluation of low back pain.  States that on Monday she developed low back pain.  Denies trauma to her back.  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  States that she notices back pain after bending over to pick up a child.  Denies bilateral incontinence, groin numbness or distal weakness.  Denies medications prior to arrival.  Reports history of low back pain and was diagnosed with degenerative disc disease some number of years ago at urgent care.    Back Pain Associated symptoms: no dysuria        Prior to Admission medications   Medication Sig Start Date End Date Taking? Authorizing Provider  cyclobenzaprine  (FLEXERIL ) 10 MG tablet Take 1 tablet (10 mg total) by mouth 2 (two) times daily as needed for muscle spasms. 11/19/23  Yes Ruthell Lonni FALCON, PA-C  naproxen  (NAPROSYN ) 500 MG tablet Take 1 tablet (500 mg total) by mouth 2 (two) times daily. 11/19/23  Yes Ruthell Lonni FALCON, PA-C  clindamycin  (CLINDAGEL) 1 % gel Apply topically 2 (two) times daily. 11/03/23   Kennyth Domino, FNP  Cyanocobalamin  (B-12 PO) Take by mouth.    [provider]  doxycycline  (VIBRA -TABS) 100 MG tablet Take 1 tablet (100 mg total) by mouth daily. Use daily for 2-4 weeks. Take with food. Stop when symptoms resolve. Avoid direct sunlight Patient not taking: Reported on 11/13/2023 11/04/23 12/04/23  Kennyth Domino, FNP  ferrous gluconate (FERGON) 324 MG tablet Take 324 mg by mouth daily with breakfast.    [provider]  Multiple Vitamins-Minerals (WOMENS MULTI PO) Take by mouth.    [provider]  PARAGARD  INTRAUTERINE COPPER  IU by Intrauterine route. 10/01/20   [provider]  norethindrone  (ORTHO MICRONOR ) 0.35 MG  tablet Take 1 tablet (0.35 mg total) by mouth daily. Patient not taking: Reported on 09/14/2019 08/19/19 08/23/20  Lola Donnice HERO, MD  norethindrone -ethinyl estradiol  (NECON) 0.5-35 MG-MCG tablet Take 1 tablet by mouth daily. 09/14/19 08/23/20  Eldonna Suzen Octave, MD    Allergies: Ferrous sulfate , Shellfish allergy, and Sulfate    Review of Systems  Genitourinary:  Negative for dysuria.  Musculoskeletal:  Positive for back pain.  All other systems reviewed and are negative.   Updated Vital Signs BP (!) 121/94 (BP Location: Right Arm)   Pulse 91   Temp 98.3 F (36.8 C) (Oral)   Resp 19   LMP 11/01/2023 (Exact Date)   SpO2 100%   Physical Exam Vitals and nursing note reviewed.  Constitutional:      General: She is not in acute distress.    Appearance: She is well-developed.  HENT:     Head: Normocephalic and atraumatic.  Eyes:     Conjunctiva/sclera: Conjunctivae normal.  Cardiovascular:     Rate and Rhythm: Normal rate and regular rhythm.     Heart sounds: No murmur heard. Pulmonary:     Effort: Pulmonary effort is normal. No respiratory distress.     Breath sounds: Normal breath sounds.  Abdominal:     Palpations: Abdomen is soft.     Tenderness: There is no abdominal tenderness.  Musculoskeletal:        General: No swelling.     Cervical back: Neck  supple.       Back:  Skin:    General: Skin is warm and dry.     Capillary Refill: Capillary refill takes less than 2 seconds.  Neurological:     Mental Status: She is alert.     Comments: 5/5 strength to bilateral lower extremities   Psychiatric:        Mood and Affect: Mood normal.     (all labs ordered are listed, but only abnormal results are displayed) Labs Reviewed  POC URINE PREG, ED    EKG: None  Radiology: DG Thoracic Spine 2 View Result Date: 11/19/2023 CLINICAL DATA:  thoracic and lumbar back pain EXAM: THORACIC SPINE 2 VIEWS; LUMBAR SPINE - COMPLETE 4+ VIEW COMPARISON:  None Available. FINDINGS:  Limited evaluation due to overlapping osseous structures and overlying soft tissues. there is no evidence of thoracic spine fracture. Mild degenerative changes. Alignment is normal. No other significant bone abnormalities are identified. T-shaped intrauterine device overlies the pelvis exact positioning unclear on radiography. IMPRESSION: No acute displaced fracture or traumatic listhesis of the thoracolumbar spine. Electronically Signed   By: Morgane  Naveau M.D.   On: 11/19/2023 03:43   DG Lumbar Spine Complete Result Date: 11/19/2023 CLINICAL DATA:  thoracic and lumbar back pain EXAM: THORACIC SPINE 2 VIEWS; LUMBAR SPINE - COMPLETE 4+ VIEW COMPARISON:  None Available. FINDINGS: Limited evaluation due to overlapping osseous structures and overlying soft tissues. there is no evidence of thoracic spine fracture. Mild degenerative changes. Alignment is normal. No other significant bone abnormalities are identified. T-shaped intrauterine device overlies the pelvis exact positioning unclear on radiography. IMPRESSION: No acute displaced fracture or traumatic listhesis of the thoracolumbar spine. Electronically Signed   By: Morgane  Naveau M.D.   On: 11/19/2023 03:43     Procedures   Medications Ordered in the ED  cyclobenzaprine  (FLEXERIL ) tablet 10 mg (10 mg Oral Given 11/19/23 0251)  ketorolac  (TORADOL ) 15 MG/ML injection 15 mg (15 mg Intramuscular Given 11/19/23 0251)    Medical Decision Making Amount and/or Complexity of Data Reviewed Labs: ordered. Radiology: ordered.  Risk Prescription drug management.   This is a 38 year old female who presents for atraumatic thoracic and lumbar back pain.  She has no centralized lumbar spinal tenderness, no centralized thoracic spinal tenderness.  She reports that her pain is worse with movement and better with rest.  Suspect musculoskeletal strain in this patient.  Reports that her symptoms began after picking up a child at daycare.  Patient is requesting  x-ray images.  Extremities were collected which were negative for any kind of acute process.  She was given Toradol , Flexeril .  Reports reduction of pain after medications were administered.  Suspect musculoskeletal strain in the patient.  Was discharged home with naproxen  as well as Flexeril .  She was advised to rest at home and follow-up with her PCP.  She was given strict return precautions and she voiced understanding.  She is stable to discharge home at this time.    Final diagnoses:  Strain of lumbar region, initial encounter    ED Discharge Orders          Ordered    cyclobenzaprine  (FLEXERIL ) 10 MG tablet  2 times daily PRN        11/19/23 0349    naproxen  (NAPROSYN ) 500 MG tablet  2 times daily        11/19/23 0349               Ruthell Lonni FALCON, PA-C 11/19/23  [id number]    Midge Golas, MD 11/19/23 [id number]

## 2023-11-19 NOTE — ED Triage Notes (Signed)
 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

## 2023-11-19 NOTE — Discharge Instructions (Addendum)
 It was a pleasure taking part in your care.  As we discussed, the x-rays were negative for any kind of acute fracture.  You most likely have musculoskeletal strain.  Please begin taking a Proxen twice a day.  Please begin taking Flexeril  twice a day as needed.  Do not drive or operate machinery while taking Flexeril , do not mix with alcohol.  Return to the ED with any new symptoms.  Follow-up with your PCP.

## 2023-11-19 NOTE — Progress Notes (Unsigned)
 [facility] [address] [place] [address] Dept: [phone] Dept Fax: [phone]  Acute Care Office Visit  Subjective:   Barbara Waller 07-Aug-1985 11/20/2023  No chief complaint on file.   HPI:   The following portions of the patient's history were reviewed and updated as appropriate: past medical history, past surgical history, family history, social history, allergies, medications, and problem list.   Patient Active Problem List   Diagnosis Date Noted   Perioral dermatitis 11/13/2023   ASCUS with positive high risk HPV cervical 12/16/2022   Low ferritin 12/05/2022   Low serum vitamin B12 12/05/2022   Hypertriglyceridemia 12/02/2022   Anemia 08/04/2017   Shellfish allergy 05/13/2016   Prediabetes 01/03/2016   Past Medical History:  Diagnosis Date   Allergy 2016   Anemia 08/04/2017   Arthritis 2012   BMI 32.0-32.9,adult 06/26/2015   Breast cyst, left 10/02/2018   Breast lump on left side at 6 o'clock position 10/21/2018   Breast pain, left 10/02/2018   Chronic midline low back pain without sciatica 05/16/2016   Flu-like symptoms 05/25/2018   Influenza B 05/25/2018   Left breast abscess 06/26/2015   LSIL (low grade squamous intraepithelial lesion) on Pap smear 08/21/2009   ABNORMAL PAP   Obesity    Sore throat 05/25/2018   Vaginal Pap smear, abnormal    Vitamin B12 deficiency 11/2019   Vitamin D  deficiency 08/04/2017   Past Surgical History:  Procedure Laterality Date   axilla abscess     COLOSTOMY     Family History  Problem Relation Age of Onset   Diabetes Mother    Hypertension Mother    Diabetes Maternal Grandmother    Diabetes Father    Heart failure Father    Arthritis Father    Anesthesia problems Neg Hx     Current Outpatient Medications:    clindamycin  (CLINDAGEL) 1 % gel, Apply topically 2 (two) times daily., Disp: 30 g, Rfl: 1   Cyanocobalamin  (B-12 PO), Take by  mouth., Disp: , Rfl:    cyclobenzaprine  (FLEXERIL ) 10 MG tablet, Take 1 tablet (10 mg total) by mouth 2 (two) times daily as needed for muscle spasms., Disp: 20 tablet, Rfl: 0   doxycycline  (VIBRA -TABS) 100 MG tablet, Take 1 tablet (100 mg total) by mouth daily. Use daily for 2-4 weeks. Take with food. Stop when symptoms resolve. Avoid direct sunlight (Patient not taking: Reported on 11/13/2023), Disp: 30 tablet, Rfl: 0   ferrous gluconate (FERGON) 324 MG tablet, Take 324 mg by mouth daily with breakfast., Disp: , Rfl:    Multiple Vitamins-Minerals (WOMENS MULTI PO), Take by mouth., Disp: , Rfl:    naproxen  (NAPROSYN ) 500 MG tablet, Take 1 tablet (500 mg total) by mouth 2 (two) times daily., Disp: 30 tablet, Rfl: 0   PARAGARD  INTRAUTERINE COPPER  IU, by Intrauterine route., Disp: , Rfl:  Allergies  Allergen Reactions   Ferrous Sulfate  Swelling   Shellfish Allergy Swelling   Sulfate Rash     ROS: A complete ROS was performed with pertinent positives/negatives noted in the HPI. The remainder of the ROS are negative.    Objective:   There were no vitals filed for this visit.  GENERAL: Well-appearing, in NAD. Well nourished.  SKIN: Pink, warm and dry. No rash, lesion, ulceration, or ecchymoses.  NECK: Trachea midline. Full ROM w/o pain or tenderness. No lymphadenopathy.  RESPIRATORY: Chest wall symmetrical. Respirations even and non-labored. Breath sounds clear to auscultation bilaterally.  CARDIAC: S1,  S2 present, regular rate and rhythm. Peripheral pulses 2+ bilaterally.  MSK: Muscle tone and strength appropriate for age. Joints w/o tenderness, redness, or swelling. EXTREMITIES: Without clubbing, cyanosis, or edema.  NEUROLOGIC: No motor or sensory deficits. Steady, even gait.  PSYCH/MENTAL STATUS: Alert, oriented x 3. Cooperative, appropriate mood and affect.    No results found for any visits on 11/20/23.    Assessment & Plan:   No orders of the defined types were placed in this  encounter.  No orders of the defined types were placed in this encounter.  Lab Orders  No laboratory test(s) ordered today   No images are attached to the encounter or orders placed in the encounter.  No follow-ups on file.   Rosina Senters, FNP

## 2023-11-20 ENCOUNTER — Encounter: Payer: Self-pay | Admitting: Internal Medicine

## 2023-11-20 ENCOUNTER — Ambulatory Visit (INDEPENDENT_AMBULATORY_CARE_PROVIDER_SITE_OTHER): Admitting: Internal Medicine

## 2023-11-20 VITALS — BP 110/70 | HR 75 | Temp 98.1°F | Ht 66.0 in | Wt 205.2 lb

## 2023-11-20 DIAGNOSIS — S39012D Strain of muscle, fascia and tendon of lower back, subsequent encounter: Secondary | ICD-10-CM

## 2023-11-20 MED ORDER — METHOCARBAMOL 500 MG PO TABS: 500.0000 mg | ORAL_TABLET | Freq: Three times a day (TID) | ORAL | 0 refills | Status: AC | PRN

## 2023-11-20 MED ORDER — CYCLOBENZAPRINE HCL 10 MG PO TABS: 10.0000 mg | ORAL_TABLET | Freq: Every day | ORAL | Status: AC

## 2023-11-20 NOTE — Patient Instructions (Signed)
 Continue naproxen  500mg  two times a day, with food  Robaxin  (methocarbamol ) during day time  Flexeril  (Cyclobenzaprine ) at bedtime  Heating pad  Stretches

## 2023-11-22 ENCOUNTER — Telehealth: Admitting: Family

## 2023-11-22 DIAGNOSIS — M545 Low back pain, unspecified: Secondary | ICD-10-CM | POA: Diagnosis not present

## 2023-11-22 MED ORDER — NAPROXEN 500 MG PO TABS: 500.0000 mg | ORAL_TABLET | Freq: Two times a day (BID) | ORAL | 0 refills | Status: DC

## 2023-11-22 MED ORDER — PREDNISONE 10 MG (21) PO TBPK: ORAL_TABLET | ORAL | 0 refills | Status: DC

## 2023-11-22 NOTE — Progress Notes (Signed)
 E-Visit for Back Pain   We are sorry that you are not feeling well.  Here is how we plan to help!  Based on what you have shared with me it looks like you mostly have acute back pain.  Acute back pain is defined as musculoskeletal pain that can resolve in 1-3 weeks with conservative treatment.  I have prescribed Naprosyn  500 mg take one by mouth twice a day non-steroid anti-inflammatory (NSAID) as well as prednisone  dose pack.   Some patients experience stomach irritation or in increased heartburn with anti-inflammatory drugs.  Please keep in mind that muscle relaxer's can cause fatigue and should not be taken while at work or driving.  Back pain is very common.  The pain often gets better over time.  The cause of back pain is usually not dangerous.  Most people can learn to manage their back pain on their own.  Home Care Stay active.  Start with short walks on flat ground if you can.  Try to walk farther each day. Do not sit, drive or stand in one place for more than 30 minutes.  Do not stay in bed. Do not avoid exercise or work.  Activity can help your back heal faster. Be careful when you bend or lift an object.  Bend at your knees, keep the object close to you, and do not twist. Sleep on a firm mattress.  Lie on your side, and bend your knees.  If you lie on your back, put a pillow under your knees. Only take medicines as told by your doctor. Put ice on the injured area. Put ice in a plastic bag Place a towel between your skin and the bag Leave the ice on for 15-20 minutes, 3-4 times a day for the first 2-3 days. 210 After that, you can switch between ice and heat packs. Ask your doctor about back exercises or massage. Avoid feeling anxious or stressed.  Find good ways to deal with stress, such as exercise.  Get Help Right Way If: Your pain does not go away with rest or medicine. Your pain does not go away in 1 week. You have new problems. You do not feel well. The pain spreads into  your legs. You cannot control when you poop (bowel movement) or pee (urinate) You feel sick to your stomach (nauseous) or throw up (vomit) You have belly (abdominal) pain. You feel like you may pass out (faint). If you develop a fever.  Make Sure you: Understand these instructions. Will watch your condition Will get help right away if you are not doing well or get worse.  Your e-visit answers were reviewed by a board certified advanced clinical practitioner to complete your personal care plan.  Depending on the condition, your plan could have included both over the counter or prescription medications.  If there is a problem please reply  once you have received a response from your provider.  Your safety is important to us .  If you have drug allergies check your prescription carefully.    You can use MyChart to ask questions about today's visit, request a non-urgent call back, or ask for a work or school excuse for 24 hours related to this e-Visit. If it has been greater than 24 hours you will need to follow up with your provider, or enter a new e-Visit to address those concerns.  You will get an e-mail in the next two days asking about your experience.  I hope that your e-visit has  been valuable and will speed your recovery. Thank you for using e-visits.   Approximately 5 minutes was spent documenting and reviewing patient's chart.

## 2023-11-24 ENCOUNTER — Other Ambulatory Visit: Payer: Self-pay | Admitting: Internal Medicine

## 2023-11-24 MED ORDER — DICLOFENAC SODIUM 75 MG PO TBEC: 75.0000 mg | DELAYED_RELEASE_TABLET | Freq: Two times a day (BID) | ORAL | 0 refills | Status: DC | PRN

## 2023-11-24 NOTE — Addendum Note (Signed)
 Addended by: BILLY KNEE on: 11/24/2023 08:16 AM   Modules accepted: Orders

## 2023-12-01 ENCOUNTER — Ambulatory Visit: Admitting: Internal Medicine

## 2024-02-16 ENCOUNTER — Ambulatory Visit: Admitting: Internal Medicine

## 2024-02-17 ENCOUNTER — Ambulatory Visit: Admitting: Internal Medicine

## 2024-02-18 ENCOUNTER — Ambulatory Visit
Admission: EM | Admit: 2024-02-18 | Discharge: 2024-02-18 | Disposition: A | Discharge status: Home / Self Care | Attending: Family Medicine | Admitting: Family Medicine

## 2024-02-18 DIAGNOSIS — J0100 Acute maxillary sinusitis, unspecified: Secondary | ICD-10-CM | POA: Diagnosis not present

## 2024-02-18 MED ORDER — PREDNISONE 20 MG PO TABS: 40.0000 mg | ORAL_TABLET | Freq: Every day | ORAL | 0 refills | Status: AC

## 2024-02-18 MED ORDER — FLUTICASONE PROPIONATE 50 MCG/ACT NA SUSP: 1.0000 | Freq: Every day | NASAL | 0 refills | Status: DC

## 2024-02-18 MED ORDER — AMOXICILLIN-POT CLAVULANATE 875-125 MG PO TABS: 1.0000 | ORAL_TABLET | Freq: Two times a day (BID) | ORAL | 0 refills | Status: DC

## 2024-02-18 NOTE — Discharge Instructions (Signed)
 Start Augmentin twice daily for 7 days.  Use Flonase daily to help with your nasal congestion and also take prednisone  daily to help with the nasal inflammation.  Nasal rinses as you tolerate.  Lots of rest and fluids.  Please follow-up with your PCP if your symptoms do not improve.  Please go to the ER if you develop any worsening symptoms.  Hope you feel better soon!

## 2024-02-18 NOTE — ED Provider Notes (Addendum)
 [facility]    CSN: [id number] Arrival date & time: 02/18/24  9161      History   Chief Complaint No chief complaint on file.   HPI Barbara Waller is a 38 y.o. female  presents for evaluation of URI symptoms for 2 weeks. Patient reports associated symptoms of sinus pressure/pain with sinus headache and photophobia. Denies N/V/D, fevers, ear pain, sore throat, body aches, cough, shortness of breath. Patient does not have a hx of asthma. Patient is not an active smoker.   States she had a cough a week ago but that is since resolved and settled in her sinuses.  States she has a history of sinus actions and this feels the same as previous infections.  Pt has taken sinus medicine OTC for symptoms. Pt has no other concerns at this time.   HPI  Past Medical History:  Diagnosis Date   Allergy 2016   Anemia 08/04/2017   Arthritis 2012   BMI 32.0-32.9,adult 06/26/2015   Breast cyst, left 10/02/2018   Breast lump on left side at 6 o'clock position 10/21/2018   Breast pain, left 10/02/2018   Chronic midline low back pain without sciatica 05/16/2016   Flu-like symptoms 05/25/2018   Influenza B 05/25/2018   Left breast abscess 06/26/2015   LSIL (low grade squamous intraepithelial lesion) on Pap smear 08/21/2009   ABNORMAL PAP   Obesity    Sore throat 05/25/2018   Vaginal Pap smear, abnormal    Vitamin B12 deficiency 11/2019   Vitamin D  deficiency 08/04/2017    Patient Active Problem List   Diagnosis Date Noted   Perioral dermatitis 11/13/2023   ASCUS with positive high risk HPV cervical 12/16/2022   Low ferritin 12/05/2022   Low serum vitamin B12 12/05/2022   Hypertriglyceridemia 12/02/2022   Anemia 08/04/2017   Shellfish allergy 05/13/2016   Prediabetes 01/03/2016    Past Surgical History:  Procedure Laterality Date   axilla abscess     COLOSTOMY      OB History     Gravida  3   Para  3   Term  3   Preterm      AB      Living  3       SAB      IAB      Ectopic      Multiple  0   Live Births  3            Home Medications    Prior to Admission medications   Medication Sig Start Date End Date Taking? Authorizing Provider  amoxicillin -clavulanate (AUGMENTIN) 875-125 MG tablet Take 1 tablet by mouth every 12 (twelve) hours. 02/18/24  Yes Keyri Salberg, Jodi R, NP  Cyanocobalamin  (B-12 PO) Take by mouth.   Yes [provider]  cyclobenzaprine  (FLEXERIL ) 10 MG tablet Take 1 tablet (10 mg total) by mouth at bedtime. 11/20/23  Yes Billy Knee, FNP  diclofenac  (VOLTAREN ) 75 MG EC tablet Take 1 tablet (75 mg total) by mouth 2 (two) times daily as needed (pain). 11/24/23  Yes Billy Knee, FNP  fluticasone (FLONASE) 50 MCG/ACT nasal spray Place 1 spray into both nostrils daily. 02/18/24  Yes Cherry Wittwer, Jodi R, NP  methocarbamol  (ROBAXIN ) 500 MG tablet Take 1 tablet (500 mg total) by mouth 3 (three) times daily as needed for muscle spasms. 11/20/23  Yes Billy Knee, FNP  predniSONE  (DELTASONE ) 20 MG tablet Take 2 tablets (40 mg total) by mouth daily with breakfast for 5 days.  02/18/24 02/23/24 Yes Makaela Cando, Jodi R, NP  ferrous gluconate (FERGON) 324 MG tablet Take 324 mg by mouth daily with breakfast.    [provider]  Multiple Vitamins-Minerals (WOMENS MULTI PO) Take by mouth.    [provider]  PARAGARD  INTRAUTERINE COPPER  IU by Intrauterine route. 10/01/20   [provider]  norethindrone  (ORTHO MICRONOR ) 0.35 MG tablet Take 1 tablet (0.35 mg total) by mouth daily. Patient not taking: Reported on 09/14/2019 08/19/19 08/23/20  Lola Donnice HERO, MD  norethindrone -ethinyl estradiol  (NECON) 0.5-35 MG-MCG tablet Take 1 tablet by mouth daily. 09/14/19 08/23/20  Eldonna Suzen Octave, MD    Family History Family History  Problem Relation Age of Onset   Diabetes Mother    Hypertension Mother    Diabetes Maternal Grandmother    Diabetes Father    Heart failure Father    Arthritis Father    Anesthesia  problems Neg Hx     Social History Social History   Tobacco Use   Smoking status: Never   Smokeless tobacco: Never  Vaping Use   Vaping status: Never Used  Substance Use Topics   Alcohol use: No   Drug use: No     Allergies   Ferrous sulfate , Shellfish allergy, and Sulfate   Review of Systems Review of Systems  HENT:  Positive for congestion, sinus pressure and sinus pain.   Eyes:  Positive for photophobia.  Neurological:  Positive for headaches.     Physical Exam Triage Vital Signs ED Triage Vitals  Encounter Vitals Group     BP 02/18/24 0916 106/66     Girls Systolic BP Percentile --      Girls Diastolic BP Percentile --      Boys Systolic BP Percentile --      Boys Diastolic BP Percentile --      Pulse Rate 02/18/24 0915 76     Resp 02/18/24 0915 18     Temp 02/18/24 0915 97.7 F (36.5 C)     Temp Source 02/18/24 0915 Oral     SpO2 02/18/24 0915 97 %     Weight --      Height --      Head Circumference --      Peak Flow --      Pain Score 02/18/24 0913 7     Pain Loc --      Pain Education --      Exclude from Growth Chart --    No data found.  Updated Vital Signs BP 106/66   Pulse 76   Temp 97.7 F (36.5 C) (Oral)   Resp 18   LMP 02/11/2024 (Exact Date)   SpO2 97%   Visual Acuity Right Eye Distance:   Left Eye Distance:   Bilateral Distance:    Right Eye Near:   Left Eye Near:    Bilateral Near:     Physical Exam Vitals and nursing note reviewed.  Constitutional:      General: She is not in acute distress.    Appearance: She is well-developed. She is not ill-appearing.  HENT:     Head: Normocephalic and atraumatic.     Right Ear: Tympanic membrane and ear canal normal.     Left Ear: Tympanic membrane and ear canal normal.     Nose: Congestion present.     Right Turbinates: Swollen and pale.     Left Turbinates: Swollen and pale.     Right Sinus: Maxillary sinus tenderness present. No frontal sinus  tenderness.     Left Sinus:  Maxillary sinus tenderness present. No frontal sinus tenderness.     Mouth/Throat:     Mouth: Mucous membranes are moist.     Pharynx: Oropharynx is clear. Uvula midline. No oropharyngeal exudate or posterior oropharyngeal erythema.     Tonsils: No tonsillar exudate or tonsillar abscesses.  Eyes:     Conjunctiva/sclera: Conjunctivae normal.     Pupils: Pupils are equal, round, and reactive to light.  Cardiovascular:     Rate and Rhythm: Normal rate and regular rhythm.     Heart sounds: Normal heart sounds.  Pulmonary:     Effort: Pulmonary effort is normal.     Breath sounds: Normal breath sounds.  Musculoskeletal:     Cervical back: Normal range of motion and neck supple.  Lymphadenopathy:     Cervical: No cervical adenopathy.  Skin:    General: Skin is warm and dry.  Neurological:     General: No focal deficit present.     Mental Status: She is alert and oriented to person, place, and time.  Psychiatric:        Mood and Affect: Mood normal.        Behavior: Behavior normal.      UC Treatments / Results  Labs (all labs ordered are listed, but only abnormal results are displayed) Labs Reviewed - No data to display  Contains abnormal data Comprehensive metabolic panel with GFR Order: [id number]  Status: Final result     Next appt: 03/21/2024 at 10:00 AM in Family Medicine Edwardo Senters, FNP)     Dx: Prediabetes   Test Result Released: Yes (seen)     Messages: Seen   0 Result Notes     1 Patient Communication     View Follow-Up Encounter          Component Ref Range & Units (hover) 3 mo ago (11/13/23) 1 yr ago (12/01/22) 4 yr ago (12/13/19) 5 yr ago (02/16/19) 5 yr ago (12/29/18) 6 yr ago (08/03/17) 8 yr ago (06/25/15)  Sodium 140 141 R 141 R 138 R 136 R 141 R 140 R  Potassium 3.4 Low  3.3 Low  R 3.9 R 3.4 Low  R 4.0 R 3.6 R 3.6 R  Chloride 106 104 R 106 R 103 R 101 R 102 R 106 R  CO2 26 25 R 22 R 21 R 22 R 26 R 26 R  Glucose, Bld 87 106 High  80 R 87 R 81 R 75 R  90 R  BUN 11 9 R 10 R 7 R 8 R 9 R 9 R  Creatinine, Ser 0.87 0.96 R 0.98 R 0.70 R 0.78 R 1.20 High  R 0.74 R  Total Bilirubin 0.7 0.3 R 0.4 R 0.3 R 0.5 R  0.4  Alkaline Phosphatase 48 65 R 64 R 67 R 58 R  71 R  AST 15 23 R 16 R 21 R 14 R  21 R  ALT 13 32 R 20 R 28 R 19 R  39 High  R  Total Protein 7.5 7.3 R 7.6 R 7.1 R 7.5 R  6.7 R  Albumin 3.9 3.9 R 4.3 R 3.8 R 3.9 R  3.7 R  GFR 83.76        Comment: Calculated using the CKD-EPI Creatinine Equation (2021)  Calcium 9.1 9.0 R 9.4 R 8.9 R 9.4 R 8.9 R 8.8 R  Resulting Agency [place] [facility]  Specimen Collected: 11/13/23 08:44 Last Resulted: 11/13/23 14:10   EKG   Radiology No results found.  Procedures Procedures (including critical care time)  Medications Ordered in UC Medications - No data to display  Initial Impression / Assessment and Plan / UC Course  I have reviewed the triage vital signs and the nursing notes.  Pertinent labs & imaging results that were available during my care of the patient were reviewed by me and considered in my medical decision making (see chart for details).     Will start Augmentin for sinusitis.  Flonase and prednisone  daily.  Nasal rinses as tolerated.  PCP follow-up if symptoms do not improve.  ER precaution reviewed. Final Clinical Impressions(s) / UC Diagnoses   Final diagnoses:  Acute maxillary sinusitis, recurrence not specified     Discharge Instructions      Start Augmentin twice daily for 7 days.  Use Flonase daily to help with your nasal congestion and also take prednisone  daily to help with the nasal inflammation.  Nasal rinses as you tolerate.  Lots of rest and fluids.  Please follow-up with your PCP if your symptoms do not improve.  Please go to the ER if you develop any worsening symptoms.  Hope you feel better soon!    ED Prescriptions     Medication Sig Dispense Auth. Provider   amoxicillin -clavulanate (AUGMENTIN)  875-125 MG tablet Take 1 tablet by mouth every 12 (twelve) hours. 14 tablet Shine Mikes, Jodi R, NP   fluticasone (FLONASE) 50 MCG/ACT nasal spray Place 1 spray into both nostrils daily. 15.8 mL Sayward Horvath, Jodi R, NP   predniSONE  (DELTASONE ) 20 MG tablet Take 2 tablets (40 mg total) by mouth daily with breakfast for 5 days. 10 tablet Helina Hullum, Jodi R, NP      PDMP not reviewed this encounter.   Loreda Myla SAUNDERS, NP 02/18/24 [id number]    Loreda Myla SAUNDERS, NP 02/18/24 [phone]

## 2024-02-18 NOTE — ED Triage Notes (Signed)
 Pt present with headaches, sensitivity to light. Pt states she had a cough one week ago. Reports the symptoms started after she received the FLU vaccine.

## 2024-02-19 ENCOUNTER — Encounter: Payer: Self-pay | Admitting: Internal Medicine

## 2024-03-02 ENCOUNTER — Telehealth: Payer: Self-pay

## 2024-03-02 NOTE — Telephone Encounter (Signed)
 LVM for pt to call the office to clarify if she is needing  QuantiFERON  lab draw TB test  or a skin test.

## 2024-03-02 NOTE — Telephone Encounter (Unsigned)
 Copied from CRM #[id number]. Topic: Clinical - Medical Advice >> Mar 02, 2024 10:05 AM China J wrote: Reason for CRM: The patient has an appointment on 12/08 for fasting labs. She wanted to know what time fasting should take place.  Please call patient back at [phone] as she is aware of the same day turn around time.

## 2024-03-03 ENCOUNTER — Other Ambulatory Visit (HOSPITAL_COMMUNITY)
Admission: RE | Admit: 2024-03-03 | Discharge: 2024-03-03 | Disposition: A | Discharge status: Home / Self Care | Source: Ambulatory Visit | Attending: Internal Medicine | Admitting: Internal Medicine

## 2024-03-03 ENCOUNTER — Encounter: Payer: Self-pay | Admitting: Internal Medicine

## 2024-03-03 ENCOUNTER — Ambulatory Visit (INDEPENDENT_AMBULATORY_CARE_PROVIDER_SITE_OTHER): Admitting: Internal Medicine

## 2024-03-03 VITALS — BP 110/62 | HR 70 | Temp 98.6°F | Ht 66.0 in | Wt 197.6 lb

## 2024-03-03 DIAGNOSIS — Z0000 Encounter for general adult medical examination without abnormal findings: Secondary | ICD-10-CM | POA: Diagnosis not present

## 2024-03-03 DIAGNOSIS — Z113 Encounter for screening for infections with a predominantly sexual mode of transmission: Secondary | ICD-10-CM | POA: Diagnosis present

## 2024-03-03 DIAGNOSIS — L918 Other hypertrophic disorders of the skin: Secondary | ICD-10-CM | POA: Insufficient documentation

## 2024-03-03 DIAGNOSIS — Z111 Encounter for screening for respiratory tuberculosis: Secondary | ICD-10-CM

## 2024-03-03 DIAGNOSIS — Z1231 Encounter for screening mammogram for malignant neoplasm of breast: Secondary | ICD-10-CM

## 2024-03-03 NOTE — Progress Notes (Signed)
 Subjective:   Barbara Waller [birth year]  03/03/2024   CC: Chief Complaint  Patient presents with   Annual Exam    Std check,  skin tag on left cheek    HPI: Barbara Waller is a 38 y.o. female who presents for a routine health maintenance exam.  Labs not collected at time of visit.   Patient complains of skin tag to left cheek.  Has been there for a period of time but has continued to get larger.  She would like this removed.  Patient also requesting STD testing.  She recently got out of a relationship 1 month ago.  Denies vaginal discharge or odor.  No known STD exposure, she just wants to make sure she does not have any STDs.  She also needs a screening TB test for work.  She has a form on her phone that needs to be completed for work.  She is attempting to send this via her MyChart or will drop it off at the office for completion.   HEALTH SCREENINGS: - Pap smear: done elsewhere - with OBGYN  - Mammogram (40+): Ordered today  - Colonoscopy (45+): Not applicable  - Bone Density (65+): Not applicable  - Lung CA screening with low-dose CT:  Not applicable Adults age 79-80 who are current cigarette smokers or quit within the last 15 years. Must have 20 pack year history.   IMMUNIZATIONS: - Tdap: Tetanus vaccination status reviewed: last tetanus booster within 10 years. - HPV: declined today - info provided on d/c summary - Influenza: Up to date   Past medical history, surgical history, medications, allergies, family history and social history reviewed with patient today and changes made to appropriate areas of the chart.   Social History   Socioeconomic History   Marital status: Divorced    Spouse name: Not on file   Number of children: 2   Years of education: Not on file   Highest education level: Some college, no degree  Occupational History   Not on file  Tobacco Use   Smoking status: Never   Smokeless tobacco: Never  Vaping Use   Vaping  status: Never Used  Substance and Sexual Activity   Alcohol use: No   Drug use: No   Sexual activity: Yes    Partners: Male    Birth control/protection: I.U.D.  Other Topics Concern   Not on file  Social History Narrative   Not on file   Social Drivers of Health   Financial Resource Strain: Medium Risk (02/16/2024)   Overall Financial Resource Strain (CARDIA)    Difficulty of Paying Living Expenses: Somewhat hard  Food Insecurity: Food Insecurity Present (02/16/2024)   Hunger Vital Sign    Worried About Running Out of Food in the Last Year: Sometimes true    Ran Out of Food in the Last Year: Sometimes true  Transportation Needs: No Transportation Needs (02/16/2024)   PRAPARE - Administrator, Civil Service (Medical): No    Lack of Transportation (Non-Medical): No  Physical Activity: Sufficiently Active (02/16/2024)   Exercise Vital Sign    Days of Exercise per Week: 4 days    Minutes of Exercise per Session: 40 min  Stress: Stress Concern Present (02/16/2024)   [employer] of Occupational Health - Occupational Stress Questionnaire    Feeling of Stress: To some extent  Social Connections: Moderately Isolated (02/16/2024)   Social Connection and Isolation Panel    Frequency of Communication with Friends and  Family: More than three times a week    Frequency of Social Gatherings with Friends and Family: Once a week    Attends Religious Services: 1 to 4 times per year    Active Member of [employer] or Organizations: No    Attends Engineer, Structural: Not on file    Marital Status: Divorced  Intimate Partner Violence: Not on file     Past Medical History:  Diagnosis Date   Allergy 2016   Anemia 08/04/2017   Arthritis 2012   BMI 32.0-32.9,adult 06/26/2015   Breast cyst, left 10/02/2018   Breast lump on left side at 6 o'clock position 10/21/2018   Breast pain, left 10/02/2018   Chronic midline low back pain without sciatica 05/16/2016   Flu-like symptoms  05/25/2018   Influenza B 05/25/2018   Left breast abscess 06/26/2015   LSIL (low grade squamous intraepithelial lesion) on Pap smear 08/21/2009   ABNORMAL PAP   Obesity    Sore throat 05/25/2018   Vaginal Pap smear, abnormal    Vitamin B12 deficiency 11/2019   Vitamin D  deficiency 08/04/2017    Past Surgical History:  Procedure Laterality Date   axilla abscess     COLOSTOMY      Current Outpatient Medications on File Prior to Visit  Medication Sig   Cyanocobalamin  (B-12 PO) Take by mouth.   ferrous gluconate (FERGON) 324 MG tablet Take 324 mg by mouth daily with breakfast.   methocarbamol  (ROBAXIN ) 500 MG tablet Take 1 tablet (500 mg total) by mouth 3 (three) times daily as needed for muscle spasms.   PARAGARD  INTRAUTERINE COPPER  IU by Intrauterine route.   cyclobenzaprine  (FLEXERIL ) 10 MG tablet Take 1 tablet (10 mg total) by mouth at bedtime. (Patient not taking: Reported on 03/03/2024)   Multiple Vitamins-Minerals (WOMENS MULTI PO) Take by mouth. (Patient not taking: Reported on 03/03/2024)   [DISCONTINUED] norethindrone  (ORTHO MICRONOR ) 0.35 MG tablet Take 1 tablet (0.35 mg total) by mouth daily. (Patient not taking: Reported on 09/14/2019)   [DISCONTINUED] norethindrone -ethinyl estradiol  (NECON) 0.5-35 MG-MCG tablet Take 1 tablet by mouth daily.   No current facility-administered medications on file prior to visit.    Allergies  Allergen Reactions   Ferrous Sulfate  Swelling   Shellfish Allergy Swelling   Sulfate Rash    Family History  Problem Relation Age of Onset   Diabetes Mother    Hypertension Mother    Diabetes Maternal Grandmother    Diabetes Father    Heart failure Father    Arthritis Father    Anesthesia problems Neg Hx      ROS: Denies fever, fatigue, unexplained weight loss/gain, hearing or vision changes, cardiac or respiratory complaints. Denies neurological deficits, musculoskeletal complaints, gastrointestinal or genitourinary complaints, mental  health complaints.  Objective:   Today's Vitals   03/03/24 1319  BP: 110/62  Pulse: 70  Temp: 98.6 F (37 C)  TempSrc: Temporal  SpO2: 98%  Weight: 197 lb 9.6 oz (89.6 kg)  Height: 5' 6 (1.676 m)    GENERAL APPEARANCE: Well-appearing, in NAD. Well nourished.  SKIN: Pink, warm and dry. Turgor normal. No rash, lesion, ulceration, or ecchymoses.  Flesh-colored skin tag to left cheek.  Hair evenly distributed.  HEENT: HEAD: Normocephalic.  EYES: PERRLA. EOMI. Lids intact w/o defect. Sclera white, Conjunctiva pink w/o exudate.  EARS: External ear w/o redness, swelling, masses or lesions. EAC clear. TM's intact, translucent w/o bulging, appropriate landmarks visualized. Appropriate acuity to conversational tones.  NOSE: Septum midline w/o deformity. Nares  patent, mucosa pink and non-inflamed w/o drainage.  THROAT: Uvula midline. Oropharynx clear. Tonsils non-inflamed w/o exudate. Oral mucosa pink and moist.  NECK: Supple, Trachea midline. Full ROM w/o pain or tenderness. No lymphadenopathy. Thyroid  non-tender w/o enlargement or palpable masses.  RESPIRATORY: Chest wall symmetrical w/o masses. Respirations even and non-labored. Breath sounds clear to auscultation bilaterally. No wheezes, rales, rhonchi, or crackles. CARDIAC: S1, S2 present, regular rate and rhythm. No gallops, murmurs, rubs, or clicks.Capillary refill <2 seconds. Peripheral pulses 2+ bilaterally. GI: Abdomen soft w/o distention. Normoactive bowel sounds. No palpable masses or tenderness. No guarding or rebound tenderness. Liver and spleen w/o tenderness or enlargement. No CVA tenderness.  MSK: Muscle tone and strength appropriate for age, w/o atrophy or abnormal movement.  EXTREMITIES: Active ROM intact, w/o tenderness, crepitus, or contracture. No obvious joint deformities or effusions. No clubbing, edema, or cyanosis.  NEUROLOGIC: CN's II-XII intact. Motor strength symmetrical with no obvious weakness. No sensory deficits.  Steady, even gait.  PSYCH/MENTAL STATUS: Alert, oriented x 3. Cooperative, appropriate mood and affect.    Depression and Anxiety Screen done today and results listed below:     03/03/2024    2:14 PM 11/20/2023   10:28 AM 11/13/2023    8:23 AM 12/25/2022    9:06 AM 12/01/2022    3:26 PM  Depression screen PHQ 2/9  Decreased Interest 0 0 0 0 0  Down, Depressed, Hopeless 0 0 1 0 0  PHQ - 2 Score 0 0 1 0 0  Altered sleeping 1  1  1   Tired, decreased energy 1  2  1   Change in appetite 1  2  0  Feeling bad or failure about yourself  0  0  0  Trouble concentrating 0  0  0  Moving slowly or fidgety/restless 0  0  0  Suicidal thoughts 0  0  0  PHQ-9 Score 3  6   2    Difficult doing work/chores Somewhat difficult  Somewhat difficult  Somewhat difficult     Data saved with a previous flowsheet row definition      03/03/2024    2:14 PM 11/13/2023    8:23 AM 12/01/2022    3:26 PM  GAD 7 : Generalized Anxiety Score  Nervous, Anxious, on Edge 1 0 0  Control/stop worrying 1 1 0  Worry too much - different things 1 1 1   Trouble relaxing 1 0 1  Restless 1 0 0  Easily annoyed or irritable 1 1 0  Afraid - awful might happen 0 1 0  Total GAD 7 Score 6 4 2   Anxiety Difficulty Somewhat difficult Somewhat difficult     Assessment & Plan:  1. Annual physical exam (Primary) - Repeat 1 year.  Patient has follow-up appointment on 03/21/2024, she will come fasting to that appointment to obtain lab work.  2. Screening for STD (sexually transmitted disease) - Cervicovaginal ancillary only - Hepatitis C antibody - RPR - HIV antibody (with reflex)  3. Screening for tuberculosis - QuantiFERON-TB Gold Plus  4. Breast cancer screening by mammogram - MM 3D SCREENING MAMMOGRAM BILATERAL BREAST; Future  5. Skin tag, acquired - Ambulatory referral to Plastic Surgery   Orders Placed This Encounter  Procedures   MM 3D SCREENING MAMMOGRAM BILATERAL BREAST    [facility]CD PF; : 10/21/2018@[facility]  NO NEED- NO  ISSUES- NO HX OF BR CA- NO IMPLANTS- NO REDUCTION SPOKE WITH PT//[facility] PT AWARE OF NO SHOW FEE 75.00    Standing Status:  Future    Expiration Date:   03/03/2025    Reason for Exam (SYMPTOM  OR DIAGNOSIS REQUIRED):   screening for breast cancer    Preferred [facility]    Is the patient pregnant?:   No   QuantiFERON-TB Gold Plus   Hepatitis C antibody   RPR   HIV antibody (with reflex)   Ambulatory referral to Plastic Surgery    Referral Priority:   Routine    Referral Type:   Surgical    Referral Reason:   Specialty Services Required    Requested Specialty:   Plastic Surgery    Number of Visits Requested:   1    PATIENT COUNSELING:  - Encouraged a healthy well-balanced diet. Patient may adjust caloric intake to maintain or achieve ideal body weight. May reduce intake of dietary saturated fat and total fat and have adequate dietary potassium and calcium preferably from fresh fruits, vegetables, and low-fat dairy products.   - Advised to avoid cigarette smoking. - Discussed with the patient that most people either abstain from alcohol or drink within safe limits (<=14/week and <=4 drinks/occasion for males, <=7/weeks and <= 3 drinks/occasion for females) and that the risk for alcohol disorders and other health effects rises proportionally with the number of drinks per week and how often a drinker exceeds daily limits. - Discussed cessation/primary prevention of drug use and availability of treatment for abuse.  - Discussed sexually transmitted diseases, avoidance of unintended pregnancy and contraceptive alternatives.  - Stressed the importance of regular exercise - Injury prevention: Discussed safety belts, safety helmets, smoke detector, smoking near bedding or upholstery.  - Dental health: Discussed importance of regular tooth brushing, flossing, and dental visits.   NEXT PREVENTATIVE PHYSICAL DUE IN 1 YEAR.  Return in about 1 year (around 03/03/2025) for  Annual Physical Exam with fasting lab work.  Rosina Senters, FNP

## 2024-03-04 LAB — CERVICOVAGINAL ANCILLARY ONLY
Bacterial Vaginitis (gardnerella): POSITIVE — AB
Candida Glabrata: NEGATIVE
Candida Vaginitis: NEGATIVE
Chlamydia: NEGATIVE
Comment: NEGATIVE
Comment: NEGATIVE
Comment: NEGATIVE
Comment: NEGATIVE
Comment: NEGATIVE
Comment: NORMAL
Neisseria Gonorrhea: NEGATIVE
Trichomonas: NEGATIVE

## 2024-03-04 NOTE — Telephone Encounter (Signed)
 Form is completed spoke with pt she will be picking it up.

## 2024-03-06 LAB — QUANTIFERON-TB GOLD PLUS
Mitogen-NIL: 7.89 [IU]/mL
NIL: 0.01 [IU]/mL
QuantiFERON-TB Gold Plus: NEGATIVE
TB1-NIL: 0 [IU]/mL
TB2-NIL: 0 [IU]/mL

## 2024-03-06 LAB — HIV ANTIBODY (ROUTINE TESTING W REFLEX)
HIV 1&2 Ab, 4th Generation: NONREACTIVE
HIV FINAL INTERPRETATION: NEGATIVE

## 2024-03-06 LAB — SYPHILIS: RPR W/REFLEX TO RPR TITER AND TREPONEMAL ANTIBODIES, TRADITIONAL SCREENING AND DIAGNOSIS ALGORITHM: RPR Ser Ql: NONREACTIVE

## 2024-03-06 LAB — HEPATITIS C ANTIBODY: Hepatitis C Ab: NONREACTIVE

## 2024-03-07 ENCOUNTER — Ambulatory Visit: Payer: Self-pay | Admitting: Internal Medicine

## 2024-03-07 DIAGNOSIS — B9689 Other specified bacterial agents as the cause of diseases classified elsewhere: Secondary | ICD-10-CM

## 2024-03-07 MED ORDER — METRONIDAZOLE 0.75 % VA GEL: 1.0000 | Freq: Every day | VAGINAL | 0 refills | Status: AC

## 2024-03-07 NOTE — Progress Notes (Signed)
 Hi Barbara Waller,  Your vaginal swab shows bacterial vaginosis. This is NOT an STD. I am sending in metrogel  antibiotic to your pharmacy. All other lab work is normal. TB test is normal.   Barbara Waller

## 2024-03-21 ENCOUNTER — Ambulatory Visit: Admitting: Internal Medicine

## 2024-03-31 ENCOUNTER — Encounter: Payer: Self-pay | Admitting: Plastic Surgery

## 2024-03-31 ENCOUNTER — Ambulatory Visit: Admitting: Plastic Surgery

## 2024-03-31 VITALS — BP 128/86 | HR 69 | Ht 66.0 in | Wt 201.0 lb

## 2024-03-31 DIAGNOSIS — L989 Disorder of the skin and subcutaneous tissue, unspecified: Secondary | ICD-10-CM

## 2024-03-31 DIAGNOSIS — L918 Other hypertrophic disorders of the skin: Secondary | ICD-10-CM

## 2024-03-31 NOTE — Progress Notes (Signed)
 Referring Provider Billy Knee, FNP [address] [place],  [place] [address]   CC:  Chief Complaint  Patient presents with   Advice Only      Barbara Waller is an 38 y.o. female.  HPI: Barbara Waller is a 38 year old female who presents today with a small skin tag on her left cheek.  Several years ago she tied a thread around this and removed a portion of it but it regrew and she is now requesting removal.  Allergies[1]  Outpatient Encounter Medications as of 03/31/2024  Medication Sig   Cyanocobalamin  (B-12 PO) Take by mouth.   ferrous gluconate (FERGON) 324 MG tablet Take 324 mg by mouth daily with breakfast.   methocarbamol  (ROBAXIN ) 500 MG tablet Take 1 tablet (500 mg total) by mouth 3 (three) times daily as needed for muscle spasms.   PARAGARD  INTRAUTERINE COPPER  IU by Intrauterine route.   cyclobenzaprine  (FLEXERIL ) 10 MG tablet Take 1 tablet (10 mg total) by mouth at bedtime. (Patient not taking: Reported on 03/31/2024)   Multiple Vitamins-Minerals (WOMENS MULTI PO) Take by mouth. (Patient not taking: Reported on 03/31/2024)   [DISCONTINUED] norethindrone  (ORTHO MICRONOR ) 0.35 MG tablet Take 1 tablet (0.35 mg total) by mouth daily. (Patient not taking: Reported on 09/14/2019)   [DISCONTINUED] norethindrone -ethinyl estradiol  (NECON) 0.5-35 MG-MCG tablet Take 1 tablet by mouth daily.   No facility-administered encounter medications on file as of 03/31/2024.     Past Medical History:  Diagnosis Date   Allergy 2016   Anemia 08/04/2017   Arthritis 2012   BMI 32.0-32.9,adult 06/26/2015   Breast cyst, left 10/02/2018   Breast lump on left side at 6 o'clock position 10/21/2018   Breast pain, left 10/02/2018   Chronic midline low back pain without sciatica 05/16/2016   Flu-like symptoms 05/25/2018   Influenza B 05/25/2018   Left breast abscess 06/26/2015   LSIL (low grade squamous intraepithelial lesion) on Pap smear 08/21/2009   ABNORMAL PAP   Obesity     Sore throat 05/25/2018   Vaginal Pap smear, abnormal    Vitamin B12 deficiency 11/2019   Vitamin D  deficiency 08/04/2017    Past Surgical History:  Procedure Laterality Date   axilla abscess     COLOSTOMY      Family History  Problem Relation Age of Onset   Diabetes Mother    Hypertension Mother    Diabetes Maternal Grandmother    Diabetes Father    Heart failure Father    Arthritis Father    Anesthesia problems Neg Hx     Social History   Social History Narrative   Not on file     Review of Systems General: Denies fevers, chills, weight loss CV: Denies chest pain, shortness of breath, palpitations Skin tag: Small skin tag on the left cheek  Physical Exam    03/31/2024    2:05 PM 03/03/2024    1:19 PM 02/18/2024    9:16 AM  Vitals with BMI  Height 5' 6 5' 6   Weight 201 lbs 197 lbs 10 oz   BMI 32.46 31.91   Systolic 128 110 893  Diastolic 86 62 66  Pulse 69 70     General:  No acute distress,  Alert and oriented, Non-Toxic, Normal speech and affect Skin: Patient has a small skin tag on the left cheek. Mammogram: Not applicable due to age Assessment/Plan Skin tag: This would be very easily removed in the office with a shave excision.  The patient has  time today before she returns to work and I have offered to remove it today.  She understands there may be a very small scar where it is removed and if it regrows we will need to reexcise it.  I will not send the tissue to pathology.  Procedure Note  Preoperative Dx: Skin tag  Postoperative Dx: Same  Procedure: Shave excision of skin tag  Anesthesia: Lidocaine  1% with 1:100,000 epinephrine  and 0.25% Sensorcaine    Indication for Procedure: Removal secondary to discomfort  Description of Procedure: Risks and complications were explained to the patient including recurrence and small scar.  Consent was confirmed and the patient understands the risks and benefits.  The potential complications and alternatives  were explained and the patient consents.  The patient expressed understanding the option of not having the procedure and the risks of a scar.  Time out was called and all information was confirmed to be correct.    The area was prepped and drapped.  The skin tag was excised with a 10 blade at the level of the skin.  The base of the lesion and the surgical defect were approximately 1 mm in diameter..  A dressing was applied.  The patient was given instructions on how to care for the area and a follow up appointment if she has any concerns.  Barbara Waller tolerated the procedure well and there were no complications. The specimen was not sent to pathology.   Barbara Waller 03/31/2024, 3:23 PM          [1]  Allergies Allergen Reactions   Ferrous Sulfate  Swelling   Shellfish Allergy Swelling   Sulfate Rash

## 2024-04-01 ENCOUNTER — Inpatient Hospital Stay: Admission: RE | Admit: 2024-04-01 | Discharge: 2024-04-01 | Attending: Internal Medicine | Admitting: Internal Medicine

## 2024-04-01 DIAGNOSIS — Z1231 Encounter for screening mammogram for malignant neoplasm of breast: Secondary | ICD-10-CM

## 2024-06-27 ENCOUNTER — Ambulatory Visit: Admitting: Dermatology

## 2025-03-06 ENCOUNTER — Encounter: Admitting: Internal Medicine
